# Patient Record
Sex: Male | Born: 1945
Health system: Southern US, Community
[De-identification: ages and names within clinical notes are randomized; demographics above are authoritative.]

## PROBLEM LIST (undated history)

## (undated) DIAGNOSIS — E1169 Type 2 diabetes mellitus with other specified complication: Secondary | ICD-10-CM

## (undated) DIAGNOSIS — J189 Pneumonia, unspecified organism: Secondary | ICD-10-CM

## (undated) DIAGNOSIS — K219 Gastro-esophageal reflux disease without esophagitis: Secondary | ICD-10-CM

## (undated) DIAGNOSIS — Z973 Presence of spectacles and contact lenses: Secondary | ICD-10-CM

## (undated) DIAGNOSIS — C3490 Malignant neoplasm of unspecified part of unspecified bronchus or lung: Secondary | ICD-10-CM

## (undated) DIAGNOSIS — E785 Hyperlipidemia, unspecified: Secondary | ICD-10-CM

## (undated) DIAGNOSIS — Z972 Presence of dental prosthetic device (complete) (partial): Secondary | ICD-10-CM

## (undated) DIAGNOSIS — E669 Obesity, unspecified: Secondary | ICD-10-CM

## (undated) DIAGNOSIS — C7931 Secondary malignant neoplasm of brain: Secondary | ICD-10-CM

## (undated) DIAGNOSIS — K08109 Complete loss of teeth, unspecified cause, unspecified class: Secondary | ICD-10-CM

## (undated) DIAGNOSIS — M1990 Unspecified osteoarthritis, unspecified site: Secondary | ICD-10-CM

## (undated) HISTORY — PX: MULTIPLE TOOTH EXTRACTIONS: SHX2053

## (undated) HISTORY — PX: COLONOSCOPY W/ BIOPSIES AND POLYPECTOMY: SHX1376

## (undated) HISTORY — PX: FRACTURE SURGERY: SHX138

---

## 2017-01-19 DIAGNOSIS — E663 Overweight: Secondary | ICD-10-CM | POA: Diagnosis not present

## 2017-01-19 DIAGNOSIS — Z1212 Encounter for screening for malignant neoplasm of rectum: Secondary | ICD-10-CM | POA: Diagnosis not present

## 2017-01-19 DIAGNOSIS — I1310 Hypertensive heart and chronic kidney disease without heart failure, with stage 1 through stage 4 chronic kidney disease, or unspecified chronic kidney disease: Secondary | ICD-10-CM | POA: Diagnosis not present

## 2017-01-19 DIAGNOSIS — N182 Chronic kidney disease, stage 2 (mild): Secondary | ICD-10-CM | POA: Diagnosis not present

## 2017-01-19 DIAGNOSIS — Z125 Encounter for screening for malignant neoplasm of prostate: Secondary | ICD-10-CM | POA: Diagnosis not present

## 2017-01-19 DIAGNOSIS — Z1211 Encounter for screening for malignant neoplasm of colon: Secondary | ICD-10-CM | POA: Diagnosis not present

## 2017-01-19 DIAGNOSIS — Z0000 Encounter for general adult medical examination without abnormal findings: Secondary | ICD-10-CM | POA: Diagnosis not present

## 2017-01-19 DIAGNOSIS — Z1389 Encounter for screening for other disorder: Secondary | ICD-10-CM | POA: Diagnosis not present

## 2017-01-19 DIAGNOSIS — Z79899 Other long term (current) drug therapy: Secondary | ICD-10-CM | POA: Diagnosis not present

## 2017-01-19 DIAGNOSIS — R7303 Prediabetes: Secondary | ICD-10-CM | POA: Diagnosis not present

## 2017-01-19 DIAGNOSIS — E785 Hyperlipidemia, unspecified: Secondary | ICD-10-CM | POA: Diagnosis not present

## 2017-02-16 DIAGNOSIS — M79671 Pain in right foot: Secondary | ICD-10-CM | POA: Diagnosis not present

## 2017-02-21 DIAGNOSIS — J0100 Acute maxillary sinusitis, unspecified: Secondary | ICD-10-CM | POA: Diagnosis not present

## 2017-04-15 DIAGNOSIS — J45901 Unspecified asthma with (acute) exacerbation: Secondary | ICD-10-CM | POA: Diagnosis not present

## 2017-04-15 DIAGNOSIS — J22 Unspecified acute lower respiratory infection: Secondary | ICD-10-CM | POA: Diagnosis not present

## 2017-04-15 DIAGNOSIS — J101 Influenza due to other identified influenza virus with other respiratory manifestations: Secondary | ICD-10-CM | POA: Diagnosis not present

## 2017-04-15 DIAGNOSIS — J302 Other seasonal allergic rhinitis: Secondary | ICD-10-CM | POA: Diagnosis not present

## 2017-04-29 DIAGNOSIS — J22 Unspecified acute lower respiratory infection: Secondary | ICD-10-CM | POA: Diagnosis not present

## 2017-07-19 DIAGNOSIS — E785 Hyperlipidemia, unspecified: Secondary | ICD-10-CM | POA: Diagnosis not present

## 2017-07-19 DIAGNOSIS — Z79899 Other long term (current) drug therapy: Secondary | ICD-10-CM | POA: Diagnosis not present

## 2017-07-19 DIAGNOSIS — R7303 Prediabetes: Secondary | ICD-10-CM | POA: Diagnosis not present

## 2017-07-19 DIAGNOSIS — N182 Chronic kidney disease, stage 2 (mild): Secondary | ICD-10-CM | POA: Diagnosis not present

## 2017-07-19 DIAGNOSIS — I1310 Hypertensive heart and chronic kidney disease without heart failure, with stage 1 through stage 4 chronic kidney disease, or unspecified chronic kidney disease: Secondary | ICD-10-CM | POA: Diagnosis not present

## 2017-08-25 DIAGNOSIS — M79675 Pain in left toe(s): Secondary | ICD-10-CM | POA: Diagnosis not present

## 2017-10-12 DIAGNOSIS — J301 Allergic rhinitis due to pollen: Secondary | ICD-10-CM | POA: Diagnosis not present

## 2017-11-16 DIAGNOSIS — Z125 Encounter for screening for malignant neoplasm of prostate: Secondary | ICD-10-CM | POA: Diagnosis not present

## 2017-11-16 DIAGNOSIS — I10 Essential (primary) hypertension: Secondary | ICD-10-CM | POA: Diagnosis not present

## 2017-11-16 DIAGNOSIS — R7303 Prediabetes: Secondary | ICD-10-CM | POA: Diagnosis not present

## 2017-11-16 DIAGNOSIS — Z0000 Encounter for general adult medical examination without abnormal findings: Secondary | ICD-10-CM | POA: Diagnosis not present

## 2017-11-16 DIAGNOSIS — E785 Hyperlipidemia, unspecified: Secondary | ICD-10-CM | POA: Diagnosis not present

## 2017-11-16 DIAGNOSIS — Z136 Encounter for screening for cardiovascular disorders: Secondary | ICD-10-CM | POA: Diagnosis not present

## 2017-11-16 DIAGNOSIS — Z1389 Encounter for screening for other disorder: Secondary | ICD-10-CM | POA: Diagnosis not present

## 2018-01-07 DIAGNOSIS — M25512 Pain in left shoulder: Secondary | ICD-10-CM | POA: Diagnosis not present

## 2018-01-12 DIAGNOSIS — M75122 Complete rotator cuff tear or rupture of left shoulder, not specified as traumatic: Secondary | ICD-10-CM | POA: Diagnosis not present

## 2018-01-14 DIAGNOSIS — S46812A Strain of other muscles, fascia and tendons at shoulder and upper arm level, left arm, initial encounter: Secondary | ICD-10-CM | POA: Diagnosis not present

## 2018-01-14 DIAGNOSIS — M75112 Incomplete rotator cuff tear or rupture of left shoulder, not specified as traumatic: Secondary | ICD-10-CM | POA: Diagnosis not present

## 2018-01-14 DIAGNOSIS — M7552 Bursitis of left shoulder: Secondary | ICD-10-CM | POA: Diagnosis not present

## 2018-01-14 DIAGNOSIS — M899 Disorder of bone, unspecified: Secondary | ICD-10-CM | POA: Diagnosis not present

## 2018-01-14 DIAGNOSIS — X58XXXA Exposure to other specified factors, initial encounter: Secondary | ICD-10-CM | POA: Diagnosis not present

## 2018-01-18 DIAGNOSIS — M75120 Complete rotator cuff tear or rupture of unspecified shoulder, not specified as traumatic: Secondary | ICD-10-CM | POA: Diagnosis not present

## 2018-01-24 DIAGNOSIS — C4002 Malignant neoplasm of scapula and long bones of left upper limb: Secondary | ICD-10-CM | POA: Diagnosis not present

## 2018-01-24 DIAGNOSIS — Z87891 Personal history of nicotine dependence: Secondary | ICD-10-CM | POA: Diagnosis not present

## 2018-01-24 DIAGNOSIS — M85812 Other specified disorders of bone density and structure, left shoulder: Secondary | ICD-10-CM | POA: Diagnosis not present

## 2018-01-24 DIAGNOSIS — K589 Irritable bowel syndrome without diarrhea: Secondary | ICD-10-CM | POA: Diagnosis not present

## 2018-01-24 DIAGNOSIS — M898X1 Other specified disorders of bone, shoulder: Secondary | ICD-10-CM | POA: Diagnosis not present

## 2018-01-24 DIAGNOSIS — M75120 Complete rotator cuff tear or rupture of unspecified shoulder, not specified as traumatic: Secondary | ICD-10-CM | POA: Diagnosis not present

## 2018-01-24 DIAGNOSIS — Z01810 Encounter for preprocedural cardiovascular examination: Secondary | ICD-10-CM | POA: Diagnosis not present

## 2018-01-24 DIAGNOSIS — K219 Gastro-esophageal reflux disease without esophagitis: Secondary | ICD-10-CM | POA: Diagnosis not present

## 2018-01-24 DIAGNOSIS — Z01818 Encounter for other preprocedural examination: Secondary | ICD-10-CM | POA: Diagnosis not present

## 2018-01-24 DIAGNOSIS — Z79899 Other long term (current) drug therapy: Secondary | ICD-10-CM | POA: Diagnosis not present

## 2018-01-24 DIAGNOSIS — I10 Essential (primary) hypertension: Secondary | ICD-10-CM | POA: Diagnosis not present

## 2018-02-01 DIAGNOSIS — F17211 Nicotine dependence, cigarettes, in remission: Secondary | ICD-10-CM | POA: Diagnosis not present

## 2018-02-01 DIAGNOSIS — C801 Malignant (primary) neoplasm, unspecified: Secondary | ICD-10-CM | POA: Diagnosis not present

## 2018-02-01 DIAGNOSIS — C7951 Secondary malignant neoplasm of bone: Secondary | ICD-10-CM | POA: Diagnosis not present

## 2018-02-03 DIAGNOSIS — R937 Abnormal findings on diagnostic imaging of other parts of musculoskeletal system: Secondary | ICD-10-CM | POA: Diagnosis not present

## 2018-02-03 DIAGNOSIS — C801 Malignant (primary) neoplasm, unspecified: Secondary | ICD-10-CM | POA: Diagnosis not present

## 2018-02-03 DIAGNOSIS — M85812 Other specified disorders of bone density and structure, left shoulder: Secondary | ICD-10-CM | POA: Diagnosis not present

## 2018-02-03 DIAGNOSIS — C7951 Secondary malignant neoplasm of bone: Secondary | ICD-10-CM | POA: Diagnosis not present

## 2018-02-03 DIAGNOSIS — R911 Solitary pulmonary nodule: Secondary | ICD-10-CM | POA: Diagnosis not present

## 2018-02-03 DIAGNOSIS — K5790 Diverticulosis of intestine, part unspecified, without perforation or abscess without bleeding: Secondary | ICD-10-CM | POA: Diagnosis not present

## 2018-02-04 DIAGNOSIS — Z85828 Personal history of other malignant neoplasm of skin: Secondary | ICD-10-CM | POA: Diagnosis not present

## 2018-02-04 DIAGNOSIS — C7951 Secondary malignant neoplasm of bone: Secondary | ICD-10-CM | POA: Diagnosis not present

## 2018-02-04 DIAGNOSIS — C3412 Malignant neoplasm of upper lobe, left bronchus or lung: Secondary | ICD-10-CM | POA: Diagnosis not present

## 2018-02-04 DIAGNOSIS — C771 Secondary and unspecified malignant neoplasm of intrathoracic lymph nodes: Secondary | ICD-10-CM | POA: Diagnosis not present

## 2018-02-10 DIAGNOSIS — C7951 Secondary malignant neoplasm of bone: Secondary | ICD-10-CM | POA: Diagnosis not present

## 2018-02-14 DIAGNOSIS — E669 Obesity, unspecified: Secondary | ICD-10-CM | POA: Diagnosis not present

## 2018-02-14 DIAGNOSIS — I10 Essential (primary) hypertension: Secondary | ICD-10-CM | POA: Diagnosis not present

## 2018-02-14 DIAGNOSIS — E785 Hyperlipidemia, unspecified: Secondary | ICD-10-CM | POA: Diagnosis not present

## 2018-02-14 DIAGNOSIS — C7951 Secondary malignant neoplasm of bone: Secondary | ICD-10-CM | POA: Diagnosis not present

## 2018-02-14 DIAGNOSIS — K589 Irritable bowel syndrome without diarrhea: Secondary | ICD-10-CM | POA: Diagnosis not present

## 2018-02-14 DIAGNOSIS — Z452 Encounter for adjustment and management of vascular access device: Secondary | ICD-10-CM | POA: Diagnosis not present

## 2018-02-14 DIAGNOSIS — M1710 Unilateral primary osteoarthritis, unspecified knee: Secondary | ICD-10-CM | POA: Diagnosis not present

## 2018-02-14 DIAGNOSIS — Z79899 Other long term (current) drug therapy: Secondary | ICD-10-CM | POA: Diagnosis not present

## 2018-02-14 DIAGNOSIS — Z859 Personal history of malignant neoplasm, unspecified: Secondary | ICD-10-CM | POA: Diagnosis not present

## 2018-02-14 DIAGNOSIS — Z87891 Personal history of nicotine dependence: Secondary | ICD-10-CM | POA: Diagnosis not present

## 2018-02-15 DIAGNOSIS — C3412 Malignant neoplasm of upper lobe, left bronchus or lung: Secondary | ICD-10-CM | POA: Diagnosis not present

## 2018-02-15 DIAGNOSIS — C7951 Secondary malignant neoplasm of bone: Secondary | ICD-10-CM | POA: Diagnosis not present

## 2018-02-16 DIAGNOSIS — Z1211 Encounter for screening for malignant neoplasm of colon: Secondary | ICD-10-CM | POA: Diagnosis not present

## 2018-02-16 DIAGNOSIS — I10 Essential (primary) hypertension: Secondary | ICD-10-CM | POA: Diagnosis not present

## 2018-02-16 DIAGNOSIS — Z0000 Encounter for general adult medical examination without abnormal findings: Secondary | ICD-10-CM | POA: Diagnosis not present

## 2018-02-16 DIAGNOSIS — R7303 Prediabetes: Secondary | ICD-10-CM | POA: Diagnosis not present

## 2018-02-16 DIAGNOSIS — E669 Obesity, unspecified: Secondary | ICD-10-CM | POA: Diagnosis not present

## 2018-02-16 DIAGNOSIS — Z125 Encounter for screening for malignant neoplasm of prostate: Secondary | ICD-10-CM | POA: Diagnosis not present

## 2018-02-16 DIAGNOSIS — Z9181 History of falling: Secondary | ICD-10-CM | POA: Diagnosis not present

## 2018-02-16 DIAGNOSIS — Z1331 Encounter for screening for depression: Secondary | ICD-10-CM | POA: Diagnosis not present

## 2018-02-16 DIAGNOSIS — E785 Hyperlipidemia, unspecified: Secondary | ICD-10-CM | POA: Diagnosis not present

## 2018-02-16 DIAGNOSIS — Z6830 Body mass index (BMI) 30.0-30.9, adult: Secondary | ICD-10-CM | POA: Diagnosis not present

## 2018-02-17 DIAGNOSIS — Z5111 Encounter for antineoplastic chemotherapy: Secondary | ICD-10-CM | POA: Diagnosis not present

## 2018-02-17 DIAGNOSIS — Z79899 Other long term (current) drug therapy: Secondary | ICD-10-CM | POA: Diagnosis not present

## 2018-02-17 DIAGNOSIS — C7951 Secondary malignant neoplasm of bone: Secondary | ICD-10-CM | POA: Diagnosis not present

## 2018-02-17 DIAGNOSIS — C3412 Malignant neoplasm of upper lobe, left bronchus or lung: Secondary | ICD-10-CM | POA: Diagnosis not present

## 2018-02-23 DIAGNOSIS — K5900 Constipation, unspecified: Secondary | ICD-10-CM | POA: Diagnosis not present

## 2018-02-23 DIAGNOSIS — Z515 Encounter for palliative care: Secondary | ICD-10-CM | POA: Diagnosis not present

## 2018-02-23 DIAGNOSIS — M1990 Unspecified osteoarthritis, unspecified site: Secondary | ICD-10-CM | POA: Diagnosis not present

## 2018-02-23 DIAGNOSIS — C3412 Malignant neoplasm of upper lobe, left bronchus or lung: Secondary | ICD-10-CM | POA: Diagnosis not present

## 2018-02-23 DIAGNOSIS — C7951 Secondary malignant neoplasm of bone: Secondary | ICD-10-CM | POA: Diagnosis not present

## 2018-03-03 DIAGNOSIS — C7951 Secondary malignant neoplasm of bone: Secondary | ICD-10-CM | POA: Diagnosis not present

## 2018-03-03 DIAGNOSIS — C3412 Malignant neoplasm of upper lobe, left bronchus or lung: Secondary | ICD-10-CM | POA: Diagnosis not present

## 2018-03-03 DIAGNOSIS — Z5111 Encounter for antineoplastic chemotherapy: Secondary | ICD-10-CM | POA: Diagnosis not present

## 2018-03-10 DIAGNOSIS — C7951 Secondary malignant neoplasm of bone: Secondary | ICD-10-CM | POA: Diagnosis not present

## 2018-03-10 DIAGNOSIS — Z79899 Other long term (current) drug therapy: Secondary | ICD-10-CM | POA: Diagnosis not present

## 2018-03-10 DIAGNOSIS — C3412 Malignant neoplasm of upper lobe, left bronchus or lung: Secondary | ICD-10-CM | POA: Diagnosis not present

## 2018-03-10 DIAGNOSIS — C771 Secondary and unspecified malignant neoplasm of intrathoracic lymph nodes: Secondary | ICD-10-CM | POA: Diagnosis not present

## 2018-03-17 DIAGNOSIS — C3412 Malignant neoplasm of upper lobe, left bronchus or lung: Secondary | ICD-10-CM | POA: Diagnosis not present

## 2018-03-17 DIAGNOSIS — C7951 Secondary malignant neoplasm of bone: Secondary | ICD-10-CM | POA: Diagnosis not present

## 2018-03-17 DIAGNOSIS — Z5111 Encounter for antineoplastic chemotherapy: Secondary | ICD-10-CM | POA: Diagnosis not present

## 2018-03-24 DIAGNOSIS — C7951 Secondary malignant neoplasm of bone: Secondary | ICD-10-CM | POA: Diagnosis not present

## 2018-03-24 DIAGNOSIS — Z5111 Encounter for antineoplastic chemotherapy: Secondary | ICD-10-CM | POA: Diagnosis not present

## 2018-03-24 DIAGNOSIS — C3412 Malignant neoplasm of upper lobe, left bronchus or lung: Secondary | ICD-10-CM | POA: Diagnosis not present

## 2018-03-31 DIAGNOSIS — C3412 Malignant neoplasm of upper lobe, left bronchus or lung: Secondary | ICD-10-CM | POA: Diagnosis not present

## 2018-03-31 DIAGNOSIS — M1712 Unilateral primary osteoarthritis, left knee: Secondary | ICD-10-CM | POA: Diagnosis not present

## 2018-03-31 DIAGNOSIS — C771 Secondary and unspecified malignant neoplasm of intrathoracic lymph nodes: Secondary | ICD-10-CM | POA: Diagnosis not present

## 2018-03-31 DIAGNOSIS — Z515 Encounter for palliative care: Secondary | ICD-10-CM | POA: Diagnosis not present

## 2018-03-31 DIAGNOSIS — C7951 Secondary malignant neoplasm of bone: Secondary | ICD-10-CM | POA: Diagnosis not present

## 2018-03-31 DIAGNOSIS — M25519 Pain in unspecified shoulder: Secondary | ICD-10-CM | POA: Diagnosis not present

## 2018-04-07 DIAGNOSIS — Z5111 Encounter for antineoplastic chemotherapy: Secondary | ICD-10-CM | POA: Diagnosis not present

## 2018-04-07 DIAGNOSIS — C3412 Malignant neoplasm of upper lobe, left bronchus or lung: Secondary | ICD-10-CM | POA: Diagnosis not present

## 2018-04-07 DIAGNOSIS — C7951 Secondary malignant neoplasm of bone: Secondary | ICD-10-CM | POA: Diagnosis not present

## 2018-04-14 DIAGNOSIS — C3412 Malignant neoplasm of upper lobe, left bronchus or lung: Secondary | ICD-10-CM | POA: Diagnosis not present

## 2018-04-14 DIAGNOSIS — Z5111 Encounter for antineoplastic chemotherapy: Secondary | ICD-10-CM | POA: Diagnosis not present

## 2018-04-14 DIAGNOSIS — C7951 Secondary malignant neoplasm of bone: Secondary | ICD-10-CM | POA: Diagnosis not present

## 2018-04-21 DIAGNOSIS — Z79899 Other long term (current) drug therapy: Secondary | ICD-10-CM | POA: Diagnosis not present

## 2018-04-21 DIAGNOSIS — R530 Neoplastic (malignant) related fatigue: Secondary | ICD-10-CM | POA: Diagnosis not present

## 2018-04-21 DIAGNOSIS — C3412 Malignant neoplasm of upper lobe, left bronchus or lung: Secondary | ICD-10-CM | POA: Diagnosis not present

## 2018-04-21 DIAGNOSIS — C7951 Secondary malignant neoplasm of bone: Secondary | ICD-10-CM | POA: Diagnosis not present

## 2018-04-21 DIAGNOSIS — C771 Secondary and unspecified malignant neoplasm of intrathoracic lymph nodes: Secondary | ICD-10-CM | POA: Diagnosis not present

## 2018-05-12 DIAGNOSIS — C7951 Secondary malignant neoplasm of bone: Secondary | ICD-10-CM

## 2018-05-12 DIAGNOSIS — I2699 Other pulmonary embolism without acute cor pulmonale: Secondary | ICD-10-CM

## 2018-05-12 DIAGNOSIS — C771 Secondary and unspecified malignant neoplasm of intrathoracic lymph nodes: Secondary | ICD-10-CM

## 2018-05-12 DIAGNOSIS — Z7901 Long term (current) use of anticoagulants: Secondary | ICD-10-CM

## 2018-05-12 DIAGNOSIS — C3412 Malignant neoplasm of upper lobe, left bronchus or lung: Secondary | ICD-10-CM

## 2018-06-02 DIAGNOSIS — Z7901 Long term (current) use of anticoagulants: Secondary | ICD-10-CM

## 2018-06-02 DIAGNOSIS — C771 Secondary and unspecified malignant neoplasm of intrathoracic lymph nodes: Secondary | ICD-10-CM

## 2018-06-02 DIAGNOSIS — C3412 Malignant neoplasm of upper lobe, left bronchus or lung: Secondary | ICD-10-CM

## 2018-06-02 DIAGNOSIS — C7951 Secondary malignant neoplasm of bone: Secondary | ICD-10-CM

## 2018-06-02 DIAGNOSIS — I2699 Other pulmonary embolism without acute cor pulmonale: Secondary | ICD-10-CM

## 2018-06-23 DIAGNOSIS — C3412 Malignant neoplasm of upper lobe, left bronchus or lung: Secondary | ICD-10-CM

## 2018-06-23 DIAGNOSIS — I2699 Other pulmonary embolism without acute cor pulmonale: Secondary | ICD-10-CM | POA: Diagnosis not present

## 2018-06-23 DIAGNOSIS — Z7901 Long term (current) use of anticoagulants: Secondary | ICD-10-CM

## 2018-06-23 DIAGNOSIS — C7951 Secondary malignant neoplasm of bone: Secondary | ICD-10-CM | POA: Diagnosis not present

## 2018-06-23 DIAGNOSIS — C771 Secondary and unspecified malignant neoplasm of intrathoracic lymph nodes: Secondary | ICD-10-CM

## 2018-06-23 DIAGNOSIS — L989 Disorder of the skin and subcutaneous tissue, unspecified: Secondary | ICD-10-CM

## 2018-07-14 DIAGNOSIS — Z7901 Long term (current) use of anticoagulants: Secondary | ICD-10-CM

## 2018-07-14 DIAGNOSIS — C7951 Secondary malignant neoplasm of bone: Secondary | ICD-10-CM

## 2018-07-14 DIAGNOSIS — I2699 Other pulmonary embolism without acute cor pulmonale: Secondary | ICD-10-CM

## 2018-07-14 DIAGNOSIS — C3412 Malignant neoplasm of upper lobe, left bronchus or lung: Secondary | ICD-10-CM

## 2018-07-14 DIAGNOSIS — C771 Secondary and unspecified malignant neoplasm of intrathoracic lymph nodes: Secondary | ICD-10-CM

## 2018-08-04 DIAGNOSIS — C7951 Secondary malignant neoplasm of bone: Secondary | ICD-10-CM

## 2018-08-04 DIAGNOSIS — Z7901 Long term (current) use of anticoagulants: Secondary | ICD-10-CM

## 2018-08-04 DIAGNOSIS — C771 Secondary and unspecified malignant neoplasm of intrathoracic lymph nodes: Secondary | ICD-10-CM | POA: Diagnosis not present

## 2018-08-04 DIAGNOSIS — C3412 Malignant neoplasm of upper lobe, left bronchus or lung: Secondary | ICD-10-CM | POA: Diagnosis not present

## 2018-08-04 DIAGNOSIS — Z86711 Personal history of pulmonary embolism: Secondary | ICD-10-CM | POA: Diagnosis not present

## 2018-08-24 DIAGNOSIS — I2699 Other pulmonary embolism without acute cor pulmonale: Secondary | ICD-10-CM | POA: Diagnosis not present

## 2018-08-24 DIAGNOSIS — R7303 Prediabetes: Secondary | ICD-10-CM | POA: Diagnosis not present

## 2018-08-24 DIAGNOSIS — I1310 Hypertensive heart and chronic kidney disease without heart failure, with stage 1 through stage 4 chronic kidney disease, or unspecified chronic kidney disease: Secondary | ICD-10-CM | POA: Diagnosis not present

## 2018-08-24 DIAGNOSIS — Z1339 Encounter for screening examination for other mental health and behavioral disorders: Secondary | ICD-10-CM | POA: Diagnosis not present

## 2018-08-24 DIAGNOSIS — M79672 Pain in left foot: Secondary | ICD-10-CM | POA: Diagnosis not present

## 2018-08-24 DIAGNOSIS — C3490 Malignant neoplasm of unspecified part of unspecified bronchus or lung: Secondary | ICD-10-CM | POA: Diagnosis not present

## 2018-08-24 DIAGNOSIS — E785 Hyperlipidemia, unspecified: Secondary | ICD-10-CM | POA: Diagnosis not present

## 2018-08-24 DIAGNOSIS — K219 Gastro-esophageal reflux disease without esophagitis: Secondary | ICD-10-CM | POA: Diagnosis not present

## 2018-09-15 DIAGNOSIS — C7951 Secondary malignant neoplasm of bone: Secondary | ICD-10-CM

## 2018-09-15 DIAGNOSIS — R509 Fever, unspecified: Secondary | ICD-10-CM

## 2018-09-15 DIAGNOSIS — B001 Herpesviral vesicular dermatitis: Secondary | ICD-10-CM

## 2018-09-15 DIAGNOSIS — C3412 Malignant neoplasm of upper lobe, left bronchus or lung: Secondary | ICD-10-CM

## 2018-10-06 DIAGNOSIS — C3412 Malignant neoplasm of upper lobe, left bronchus or lung: Secondary | ICD-10-CM | POA: Diagnosis not present

## 2018-10-06 DIAGNOSIS — C7951 Secondary malignant neoplasm of bone: Secondary | ICD-10-CM

## 2018-10-27 DIAGNOSIS — C3412 Malignant neoplasm of upper lobe, left bronchus or lung: Secondary | ICD-10-CM | POA: Diagnosis not present

## 2018-10-27 DIAGNOSIS — C7951 Secondary malignant neoplasm of bone: Secondary | ICD-10-CM | POA: Diagnosis not present

## 2018-12-06 DIAGNOSIS — Z6833 Body mass index (BMI) 33.0-33.9, adult: Secondary | ICD-10-CM | POA: Diagnosis not present

## 2018-12-06 DIAGNOSIS — E785 Hyperlipidemia, unspecified: Secondary | ICD-10-CM | POA: Diagnosis not present

## 2018-12-06 DIAGNOSIS — E669 Obesity, unspecified: Secondary | ICD-10-CM | POA: Diagnosis not present

## 2018-12-06 DIAGNOSIS — Z0000 Encounter for general adult medical examination without abnormal findings: Secondary | ICD-10-CM | POA: Diagnosis not present

## 2018-12-06 DIAGNOSIS — I1310 Hypertensive heart and chronic kidney disease without heart failure, with stage 1 through stage 4 chronic kidney disease, or unspecified chronic kidney disease: Secondary | ICD-10-CM | POA: Diagnosis not present

## 2018-12-06 DIAGNOSIS — Z125 Encounter for screening for malignant neoplasm of prostate: Secondary | ICD-10-CM | POA: Diagnosis not present

## 2018-12-06 DIAGNOSIS — Z139 Encounter for screening, unspecified: Secondary | ICD-10-CM | POA: Diagnosis not present

## 2018-12-29 DIAGNOSIS — C7951 Secondary malignant neoplasm of bone: Secondary | ICD-10-CM

## 2018-12-29 DIAGNOSIS — C3412 Malignant neoplasm of upper lobe, left bronchus or lung: Secondary | ICD-10-CM | POA: Diagnosis not present

## 2019-01-19 DIAGNOSIS — C3412 Malignant neoplasm of upper lobe, left bronchus or lung: Secondary | ICD-10-CM | POA: Diagnosis not present

## 2019-01-19 DIAGNOSIS — C7951 Secondary malignant neoplasm of bone: Secondary | ICD-10-CM

## 2019-02-09 DIAGNOSIS — C3412 Malignant neoplasm of upper lobe, left bronchus or lung: Secondary | ICD-10-CM | POA: Diagnosis not present

## 2019-03-02 DIAGNOSIS — C3412 Malignant neoplasm of upper lobe, left bronchus or lung: Secondary | ICD-10-CM | POA: Diagnosis not present

## 2019-03-23 DIAGNOSIS — C3412 Malignant neoplasm of upper lobe, left bronchus or lung: Secondary | ICD-10-CM | POA: Diagnosis not present

## 2019-04-13 DIAGNOSIS — C3412 Malignant neoplasm of upper lobe, left bronchus or lung: Secondary | ICD-10-CM | POA: Diagnosis not present

## 2019-05-04 DIAGNOSIS — C3412 Malignant neoplasm of upper lobe, left bronchus or lung: Secondary | ICD-10-CM | POA: Diagnosis not present

## 2019-05-25 DIAGNOSIS — C3412 Malignant neoplasm of upper lobe, left bronchus or lung: Secondary | ICD-10-CM

## 2019-06-01 DIAGNOSIS — C3412 Malignant neoplasm of upper lobe, left bronchus or lung: Secondary | ICD-10-CM

## 2019-07-31 DIAGNOSIS — C3490 Malignant neoplasm of unspecified part of unspecified bronchus or lung: Secondary | ICD-10-CM | POA: Diagnosis not present

## 2019-07-31 DIAGNOSIS — Z5111 Encounter for antineoplastic chemotherapy: Secondary | ICD-10-CM | POA: Diagnosis not present

## 2019-07-31 DIAGNOSIS — R911 Solitary pulmonary nodule: Secondary | ICD-10-CM | POA: Diagnosis not present

## 2019-08-01 DIAGNOSIS — C7951 Secondary malignant neoplasm of bone: Secondary | ICD-10-CM

## 2019-08-01 DIAGNOSIS — C3412 Malignant neoplasm of upper lobe, left bronchus or lung: Secondary | ICD-10-CM | POA: Diagnosis not present

## 2019-10-26 ENCOUNTER — Other Ambulatory Visit: Payer: Self-pay

## 2019-12-05 DIAGNOSIS — C7951 Secondary malignant neoplasm of bone: Secondary | ICD-10-CM | POA: Diagnosis not present

## 2019-12-14 DIAGNOSIS — E785 Hyperlipidemia, unspecified: Secondary | ICD-10-CM | POA: Diagnosis not present

## 2019-12-14 DIAGNOSIS — Z1331 Encounter for screening for depression: Secondary | ICD-10-CM | POA: Diagnosis not present

## 2019-12-14 DIAGNOSIS — Z6834 Body mass index (BMI) 34.0-34.9, adult: Secondary | ICD-10-CM | POA: Diagnosis not present

## 2019-12-14 DIAGNOSIS — Z136 Encounter for screening for cardiovascular disorders: Secondary | ICD-10-CM | POA: Diagnosis not present

## 2019-12-14 DIAGNOSIS — Z139 Encounter for screening, unspecified: Secondary | ICD-10-CM | POA: Diagnosis not present

## 2019-12-14 DIAGNOSIS — Z0000 Encounter for general adult medical examination without abnormal findings: Secondary | ICD-10-CM | POA: Diagnosis not present

## 2019-12-14 DIAGNOSIS — Z125 Encounter for screening for malignant neoplasm of prostate: Secondary | ICD-10-CM | POA: Diagnosis not present

## 2019-12-14 DIAGNOSIS — Z9181 History of falling: Secondary | ICD-10-CM | POA: Diagnosis not present

## 2019-12-14 DIAGNOSIS — R7303 Prediabetes: Secondary | ICD-10-CM | POA: Diagnosis not present

## 2019-12-14 DIAGNOSIS — C3490 Malignant neoplasm of unspecified part of unspecified bronchus or lung: Secondary | ICD-10-CM | POA: Diagnosis not present

## 2019-12-14 DIAGNOSIS — E669 Obesity, unspecified: Secondary | ICD-10-CM | POA: Diagnosis not present

## 2019-12-14 DIAGNOSIS — N182 Chronic kidney disease, stage 2 (mild): Secondary | ICD-10-CM | POA: Diagnosis not present

## 2019-12-14 DIAGNOSIS — I1310 Hypertensive heart and chronic kidney disease without heart failure, with stage 1 through stage 4 chronic kidney disease, or unspecified chronic kidney disease: Secondary | ICD-10-CM | POA: Diagnosis not present

## 2019-12-14 DIAGNOSIS — K219 Gastro-esophageal reflux disease without esophagitis: Secondary | ICD-10-CM | POA: Diagnosis not present

## 2020-03-14 DIAGNOSIS — R7303 Prediabetes: Secondary | ICD-10-CM | POA: Diagnosis not present

## 2020-03-14 DIAGNOSIS — E785 Hyperlipidemia, unspecified: Secondary | ICD-10-CM | POA: Diagnosis not present

## 2020-03-21 DIAGNOSIS — E785 Hyperlipidemia, unspecified: Secondary | ICD-10-CM | POA: Diagnosis not present

## 2020-03-21 DIAGNOSIS — N182 Chronic kidney disease, stage 2 (mild): Secondary | ICD-10-CM | POA: Diagnosis not present

## 2020-03-21 DIAGNOSIS — I1310 Hypertensive heart and chronic kidney disease without heart failure, with stage 1 through stage 4 chronic kidney disease, or unspecified chronic kidney disease: Secondary | ICD-10-CM | POA: Diagnosis not present

## 2020-03-21 DIAGNOSIS — Z6834 Body mass index (BMI) 34.0-34.9, adult: Secondary | ICD-10-CM | POA: Diagnosis not present

## 2020-03-21 DIAGNOSIS — R7303 Prediabetes: Secondary | ICD-10-CM | POA: Diagnosis not present

## 2020-03-21 DIAGNOSIS — C3490 Malignant neoplasm of unspecified part of unspecified bronchus or lung: Secondary | ICD-10-CM | POA: Diagnosis not present

## 2020-03-21 DIAGNOSIS — Z86718 Personal history of other venous thrombosis and embolism: Secondary | ICD-10-CM | POA: Diagnosis not present

## 2020-03-21 DIAGNOSIS — K219 Gastro-esophageal reflux disease without esophagitis: Secondary | ICD-10-CM | POA: Diagnosis not present

## 2020-03-26 DIAGNOSIS — C7951 Secondary malignant neoplasm of bone: Secondary | ICD-10-CM | POA: Diagnosis not present

## 2020-03-26 DIAGNOSIS — C3412 Malignant neoplasm of upper lobe, left bronchus or lung: Secondary | ICD-10-CM | POA: Diagnosis not present

## 2020-04-10 ENCOUNTER — Telehealth: Payer: Self-pay | Admitting: Internal Medicine

## 2020-04-10 ENCOUNTER — Other Ambulatory Visit: Payer: Self-pay | Admitting: Radiation Therapy

## 2020-04-10 DIAGNOSIS — S299XXA Unspecified injury of thorax, initial encounter: Secondary | ICD-10-CM | POA: Diagnosis not present

## 2020-04-10 DIAGNOSIS — R519 Headache, unspecified: Secondary | ICD-10-CM | POA: Diagnosis not present

## 2020-04-10 DIAGNOSIS — C7931 Secondary malignant neoplasm of brain: Secondary | ICD-10-CM | POA: Diagnosis not present

## 2020-04-10 DIAGNOSIS — G9389 Other specified disorders of brain: Secondary | ICD-10-CM | POA: Diagnosis not present

## 2020-04-10 DIAGNOSIS — Z041 Encounter for examination and observation following transport accident: Secondary | ICD-10-CM | POA: Diagnosis not present

## 2020-04-10 DIAGNOSIS — I10 Essential (primary) hypertension: Secondary | ICD-10-CM | POA: Diagnosis not present

## 2020-04-10 DIAGNOSIS — S0990XA Unspecified injury of head, initial encounter: Secondary | ICD-10-CM | POA: Diagnosis not present

## 2020-04-10 DIAGNOSIS — Z87891 Personal history of nicotine dependence: Secondary | ICD-10-CM | POA: Diagnosis not present
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
## 2020-04-11 ENCOUNTER — Encounter (HOSPITAL_COMMUNITY): Payer: Self-pay | Admitting: Internal Medicine

## 2020-04-11 ENCOUNTER — Inpatient Hospital Stay (HOSPITAL_COMMUNITY): Payer: No Typology Code available for payment source

## 2020-04-11 ENCOUNTER — Inpatient Hospital Stay (HOSPITAL_COMMUNITY)
Admission: AD | Admit: 2020-04-11 | Discharge: 2020-04-12 | DRG: 054 | Disposition: A | Discharge status: Home / Self Care | Payer: No Typology Code available for payment source | Source: Other Acute Inpatient Hospital | Attending: Internal Medicine | Admitting: Internal Medicine

## 2020-04-11 ENCOUNTER — Encounter: Payer: Self-pay | Admitting: *Deleted

## 2020-04-11 ENCOUNTER — Ambulatory Visit
Admission: RE | Admit: 2020-04-11 | Discharge: 2020-04-11 | Disposition: A | Discharge status: Home / Self Care | Payer: No Typology Code available for payment source | Source: Ambulatory Visit | Attending: Radiation Oncology | Admitting: Radiation Oncology

## 2020-04-11 ENCOUNTER — Other Ambulatory Visit: Payer: Self-pay | Admitting: Neurosurgery

## 2020-04-11 ENCOUNTER — Other Ambulatory Visit: Payer: Self-pay | Admitting: Radiation Oncology

## 2020-04-11 DIAGNOSIS — C3490 Malignant neoplasm of unspecified part of unspecified bronchus or lung: Secondary | ICD-10-CM | POA: Insufficient documentation

## 2020-04-11 DIAGNOSIS — Z79899 Other long term (current) drug therapy: Secondary | ICD-10-CM | POA: Diagnosis not present

## 2020-04-11 DIAGNOSIS — Z882 Allergy status to sulfonamides status: Secondary | ICD-10-CM | POA: Diagnosis not present

## 2020-04-11 DIAGNOSIS — K219 Gastro-esophageal reflux disease without esophagitis: Secondary | ICD-10-CM

## 2020-04-11 DIAGNOSIS — Z6831 Body mass index (BMI) 31.0-31.9, adult: Secondary | ICD-10-CM | POA: Diagnosis not present

## 2020-04-11 DIAGNOSIS — C3412 Malignant neoplasm of upper lobe, left bronchus or lung: Secondary | ICD-10-CM | POA: Diagnosis present

## 2020-04-11 DIAGNOSIS — Y92410 Unspecified street and highway as the place of occurrence of the external cause: Secondary | ICD-10-CM

## 2020-04-11 DIAGNOSIS — Z9225 Personal history of immunosupression therapy: Secondary | ICD-10-CM | POA: Diagnosis not present

## 2020-04-11 DIAGNOSIS — E785 Hyperlipidemia, unspecified: Secondary | ICD-10-CM | POA: Diagnosis present

## 2020-04-11 DIAGNOSIS — E669 Obesity, unspecified: Secondary | ICD-10-CM | POA: Diagnosis present

## 2020-04-11 DIAGNOSIS — N183 Chronic kidney disease, stage 3 unspecified: Secondary | ICD-10-CM | POA: Diagnosis present

## 2020-04-11 DIAGNOSIS — C7931 Secondary malignant neoplasm of brain: Principal | ICD-10-CM | POA: Diagnosis present

## 2020-04-11 DIAGNOSIS — G936 Cerebral edema: Secondary | ICD-10-CM | POA: Diagnosis present

## 2020-04-11 DIAGNOSIS — E1169 Type 2 diabetes mellitus with other specified complication: Secondary | ICD-10-CM | POA: Diagnosis not present

## 2020-04-11 DIAGNOSIS — Z7984 Long term (current) use of oral hypoglycemic drugs: Secondary | ICD-10-CM

## 2020-04-11 DIAGNOSIS — G939 Disorder of brain, unspecified: Secondary | ICD-10-CM | POA: Diagnosis not present

## 2020-04-11 DIAGNOSIS — E1122 Type 2 diabetes mellitus with diabetic chronic kidney disease: Secondary | ICD-10-CM | POA: Diagnosis present

## 2020-04-11 DIAGNOSIS — Z9221 Personal history of antineoplastic chemotherapy: Secondary | ICD-10-CM | POA: Diagnosis not present

## 2020-04-11 HISTORY — DX: Type 2 diabetes mellitus with other specified complication: E66.9

## 2020-04-11 HISTORY — DX: Hyperlipidemia, unspecified: E78.5

## 2020-04-11 HISTORY — DX: Type 2 diabetes mellitus with other specified complication: E11.69

## 2020-04-11 HISTORY — DX: Malignant neoplasm of unspecified part of unspecified bronchus or lung: C34.90

## 2020-04-11 LAB — HEMOGLOBIN A1C
Hgb A1c MFr Bld: 6.9 % — ABNORMAL HIGH (ref 4.8–5.6)
Mean Plasma Glucose: 151.33 mg/dL

## 2020-04-11 LAB — SURGICAL PCR SCREEN
MRSA, PCR: NEGATIVE
Staphylococcus aureus: NEGATIVE

## 2020-04-11 LAB — GLUCOSE, CAPILLARY: Glucose-Capillary: 171 mg/dL — ABNORMAL HIGH (ref 70–99)

## 2020-04-11 IMAGING — MR MR HEAD WO/W CM
13 of 14 series · 38 of 48 positions shown · IV contrast (gadavist)
Comparison: [DATE]

CLINICAL DATA: Lung cancer with brain metastasis

EXAM:
MRI HEAD WITHOUT AND WITH CONTRAST
TECHNIQUE: Multiplanar, multiecho pulse sequences of the brain and surrounding
structures were obtained without and with intravenous contrast.
CONTRAST:  10mL GADAVIST GADOBUTROL 1 MMOL/ML IV SOLN

[Series 2: FLAIR · sagittal · 3.0mm · 0.47mm/px · 1 of 45 slices shown (1 of 2)]
[im 1/45]
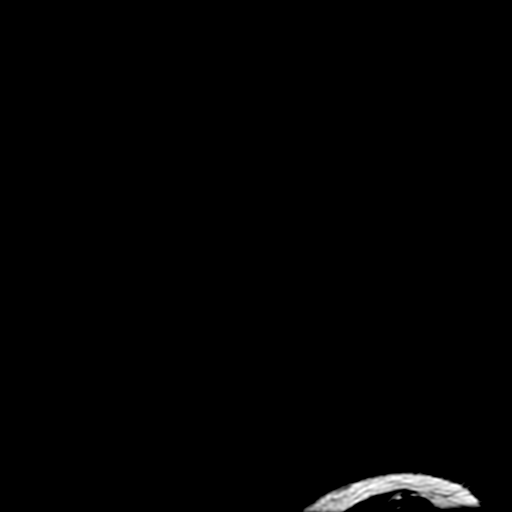

[Series 3: T2 · axial · 5.0mm · 0.43mm/px · 1 of 33 slices shown]
[im 1/33]
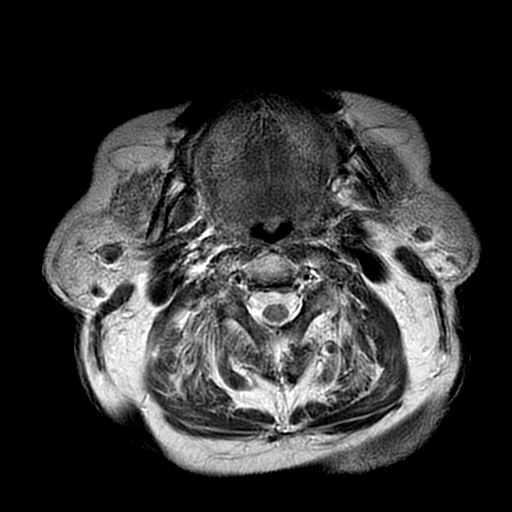

[Series 4: ax dti · axial · 3.0mm · 0.94mm/px · z∈[-79,+113]mm · 15 of 1690 slices shown]
[im 1/1690]
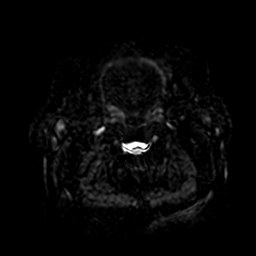
[im 121/1690]
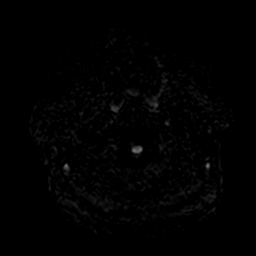
[im 242/1690]
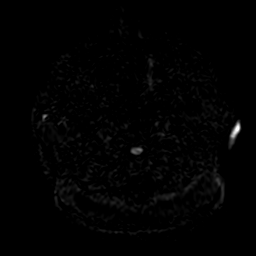
[im 362/1690]
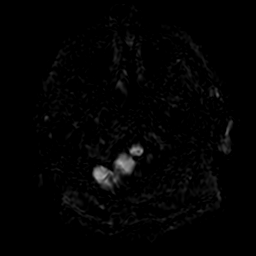
[im 483/1690]
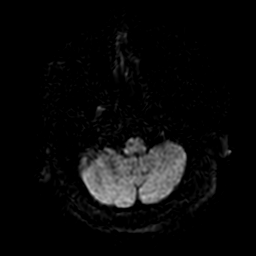
[im 604/1690]
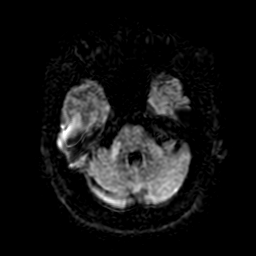
[im 724/1690]
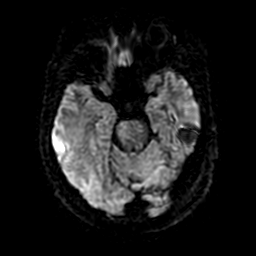
[im 845/1690]
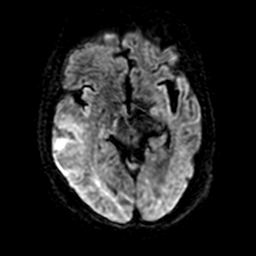
[im 966/1690]
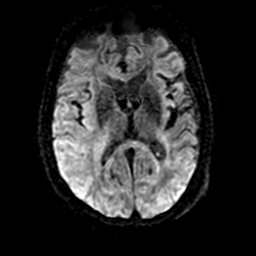
[im 1086/1690]
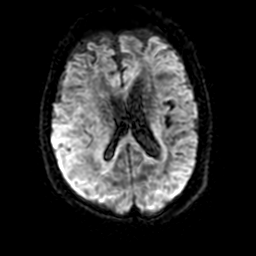
[im 1207/1690]
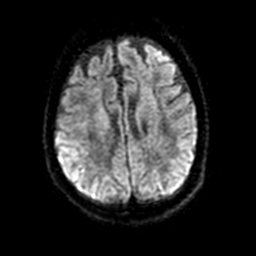
[im 1328/1690]
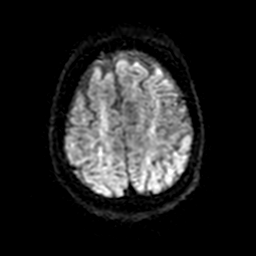
[im 1448/1690]
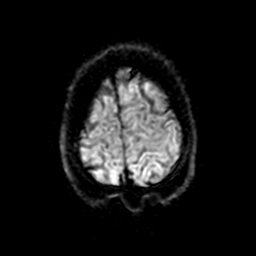
[im 1569/1690]
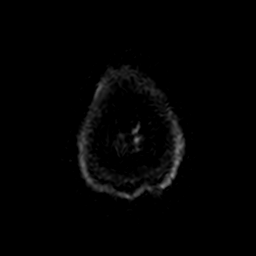
[im 1690/1690]
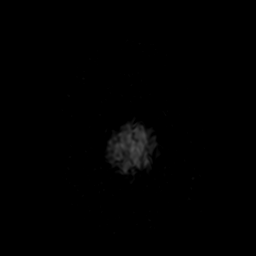

[Series 6: (person_name) · axial · 3.0mm · 0.47mm/px · 1 of 116 slices shown]
[im 1/116]
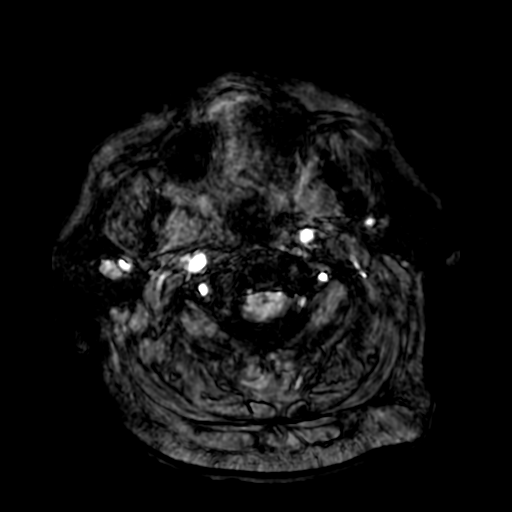

[Series 7: ax 3(person_name) · axial · 1.0mm · 1.02mm/px · 1 of 186 slices shown (1 of 2)]
[im 1/186]
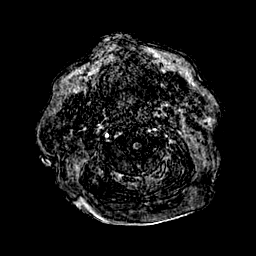

[Series 8: T2 post-contrast · coronal · 3.0mm · 0.39mm/px · 1 of 66 slices shown]
[im 1/66]
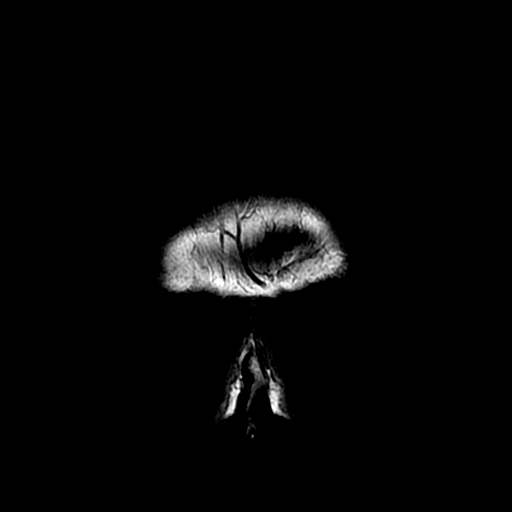

[Series 9: ax 3(person_name) · axial · 1.0mm · 1.02mm/px · z∈[-72,+113]mm · 2 of 186 slices shown (2 of 2)]
[im 1/186]
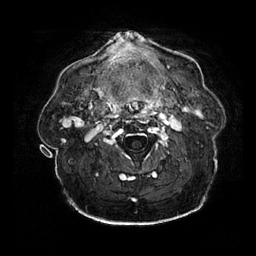
[im 186/186]
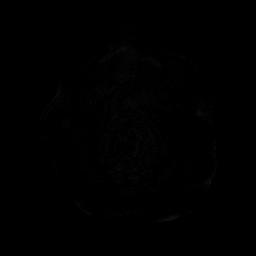

[Series 10: T1 · coronal · 5.0mm · 0.43mm/px · 1 of 33 slices shown]
[im 1/33]
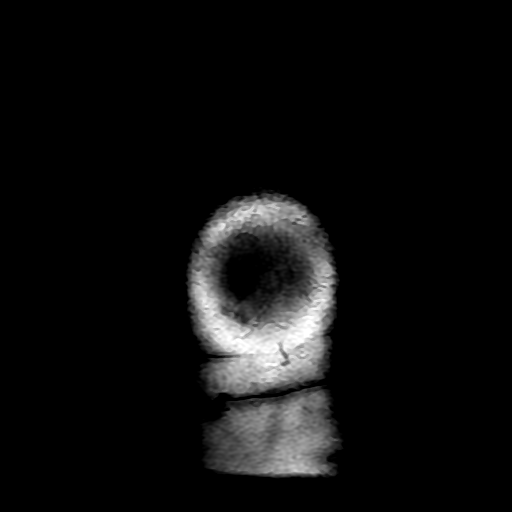

[Series 11: FLAIR · sagittal · 3.0mm · 0.47mm/px · 1 of 45 slices shown (2 of 2)]
[im 1/45]
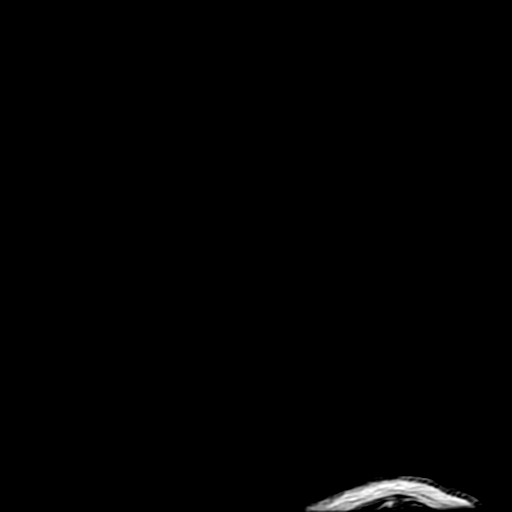

[Series 410: orig: ax dti · axial · 3.0mm · 0.94mm/px · z∈[-79,+113]mm · 9 of 1690 slices shown]
[im 1/1690]
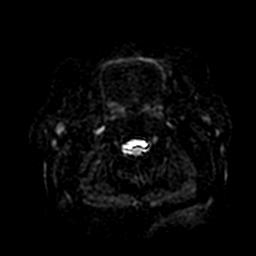
[im 113/1690]
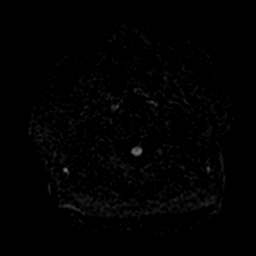
[im 338/1690]
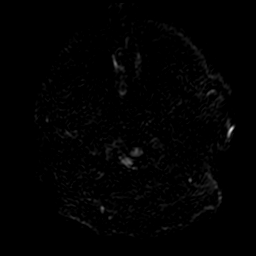
[im 564/1690]
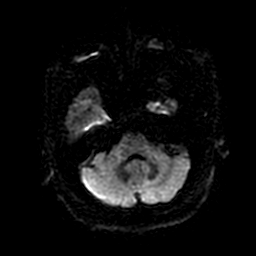
[im 789/1690]
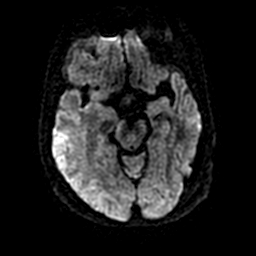
[im 1014/1690]
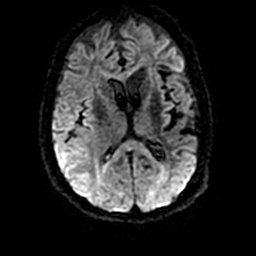
[im 1239/1690]
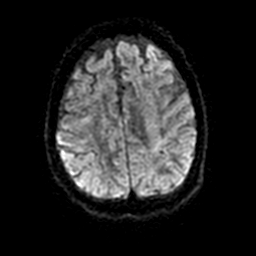
[im 1464/1690]
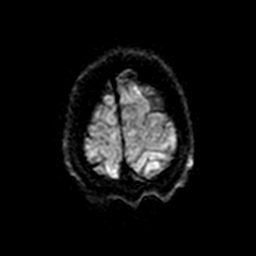
[im 1690/1690]
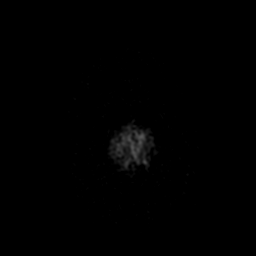

[Series 500: multiplanar reconstruction (mpr) · axial · 1.0mm · 0.50mm/px · z∈[-137,+119]mm · 2 of 257 slices shown (1 of 2)]
[im 1/257]
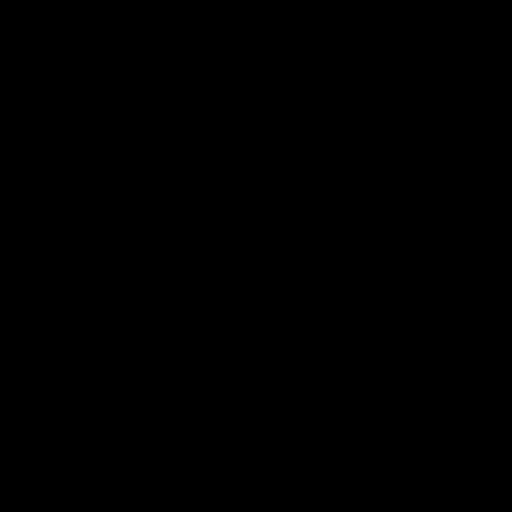
[im 257/257]
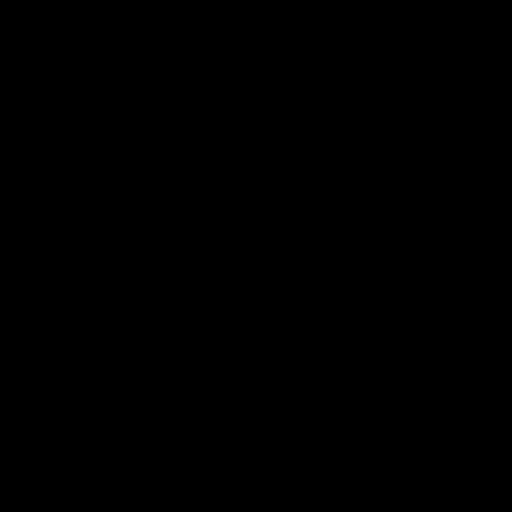

[Series 501: multiplanar reconstruction (mpr) · coronal · 1.0mm · 0.50mm/px · 2 of 245 slices shown (2 of 2)]
[im 1/245]
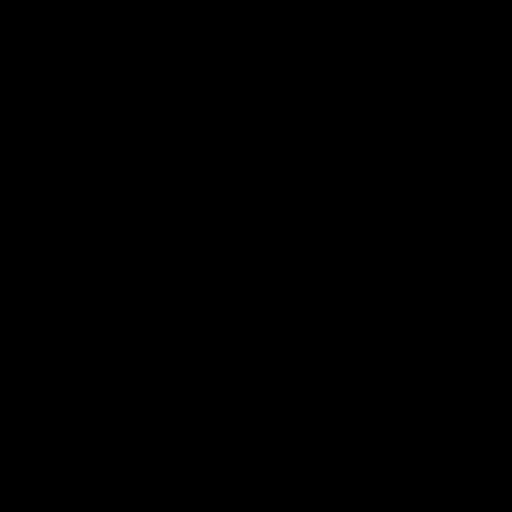
[im 245/245]
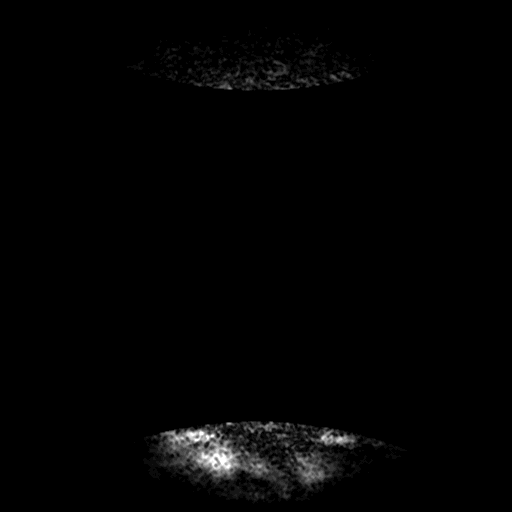

[Series 600: filt_pha: (person_name) · axial · 3.0mm · 0.47mm/px · 1 of 116 slices shown]
[im 1/116]
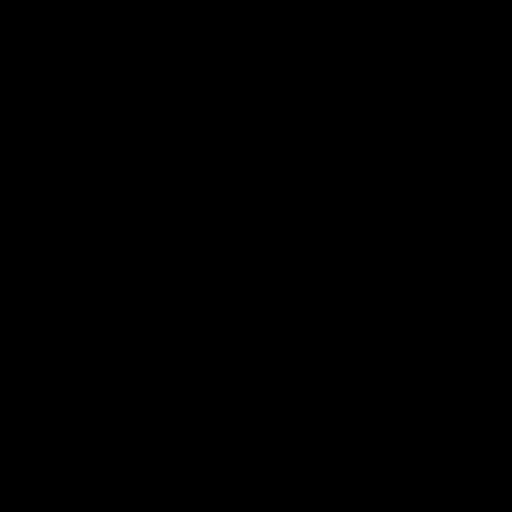

[38 of 48 positions shown; findings below may reference images not displayed]

FINDINGS: Motion artifact is present.

Brain: Heterogeneously enhancing lesion of the peripheral right
temporal lobe probably within the middle temporal gyrus is again
identified. This now measures 1.8 x 1.7 x 2.1 cm with decrease in
size of the more homogeneously enhancing portion at the deep margin.
Extensive surrounding edema is again noted similar to the prior
study. There is unchanged partial effacement of the right lateral
ventricle, slight medialization of the right uncus, and trace
leftward midline shift.

No new or acute findings.

Vascular: Major vessel flow voids at the skull base are preserved.

Skull and upper cervical spine: Normal marrow signal is preserved.

Sinuses/Orbits: Paranasal sinuses are aerated. Orbits are
unremarkable.

Other: Sella is unremarkable.  Mastoid air cells are clear.
IMPRESSION: Right temporal lobe mass measures slightly smaller, which may be
related to interval steroid administration. Extent of surrounding
edema is not substantially changed and there is stable mild regional
mass effect.

## 2020-04-11 MED ORDER — SODIUM CHLORIDE 0.9% FLUSH: 10.0000 mL | INTRAVENOUS | Status: DC | PRN

## 2020-04-11 MED ORDER — GADOBUTROL 1 MMOL/ML IV SOLN
10.0000 mL | Freq: Once | INTRAVENOUS | Status: AC | PRN
  Administered 2020-04-11: 10 mL via INTRAVENOUS

## 2020-04-11 MED ORDER — ACETAMINOPHEN 325 MG PO TABS: 650.0000 mg | ORAL_TABLET | Freq: Four times a day (QID) | ORAL | Status: DC | PRN

## 2020-04-11 MED ORDER — INSULIN ASPART 100 UNIT/ML ~~LOC~~ SOLN
0.0000 [IU] | Freq: Three times a day (TID) | SUBCUTANEOUS | Status: DC
  Administered 2020-04-12: 2 [IU] via SUBCUTANEOUS

## 2020-04-11 MED ORDER — LEVETIRACETAM 500 MG PO TABS
500.0000 mg | ORAL_TABLET | Freq: Two times a day (BID) | ORAL | Status: DC
  Administered 2020-04-11 – 2020-04-12 (×2): 500 mg via ORAL
  Filled 2020-04-11 (×2): qty 1

## 2020-04-11 MED ORDER — SODIUM CHLORIDE 0.9% FLUSH
10.0000 mL | Freq: Two times a day (BID) | INTRAVENOUS | Status: DC
  Administered 2020-04-12: 10 mL

## 2020-04-11 MED ORDER — ONDANSETRON HCL 4 MG/2ML IJ SOLN: 4.0000 mg | Freq: Four times a day (QID) | INTRAMUSCULAR | Status: DC | PRN

## 2020-04-11 MED ORDER — CHLORHEXIDINE GLUCONATE CLOTH 2 % EX PADS
6.0000 | MEDICATED_PAD | Freq: Every day | CUTANEOUS | Status: DC
  Administered 2020-04-11 – 2020-04-12 (×2): 6 via TOPICAL

## 2020-04-11 MED ORDER — ONDANSETRON HCL 4 MG PO TABS: 4.0000 mg | ORAL_TABLET | Freq: Four times a day (QID) | ORAL | Status: DC | PRN

## 2020-04-11 MED ORDER — ALBUTEROL SULFATE (2.5 MG/3ML) 0.083% IN NEBU: 2.5000 mg | INHALATION_SOLUTION | Freq: Four times a day (QID) | RESPIRATORY_TRACT | Status: DC | PRN

## 2020-04-11 MED ORDER — ACETAMINOPHEN 650 MG RE SUPP: 650.0000 mg | Freq: Four times a day (QID) | RECTAL | Status: DC | PRN

## 2020-04-11 MED ORDER — DEXAMETHASONE 4 MG PO TABS
4.0000 mg | ORAL_TABLET | Freq: Three times a day (TID) | ORAL | Status: DC
  Administered 2020-04-11 – 2020-04-12 (×3): 4 mg via ORAL
  Filled 2020-04-11 (×6): qty 1

## 2020-04-11 MED ORDER — PANTOPRAZOLE SODIUM 40 MG PO TBEC
40.0000 mg | DELAYED_RELEASE_TABLET | Freq: Every day | ORAL | Status: DC
  Administered 2020-04-11 – 2020-04-12 (×2): 40 mg via ORAL
  Filled 2020-04-11 (×2): qty 1

## 2020-04-11 MED ORDER — INSULIN ASPART 100 UNIT/ML ~~LOC~~ SOLN: 0.0000 [IU] | Freq: Every day | SUBCUTANEOUS | Status: DC

## 2020-04-11 MED ORDER — PSYLLIUM 95 % PO PACK
1.0000 | PACK | Freq: Every day | ORAL | Status: DC
  Administered 2020-04-12: 1 via ORAL
  Filled 2020-04-11: qty 1

## 2020-04-11 MED ORDER — DEXAMETHASONE 4 MG PO TABS
4.0000 mg | ORAL_TABLET | Freq: Three times a day (TID) | ORAL | 0 refills | Status: DC
Start: 2020-04-11 — End: 2020-07-23

## 2020-04-11 MED ORDER — SODIUM CHLORIDE 0.9% FLUSH
3.0000 mL | Freq: Two times a day (BID) | INTRAVENOUS | Status: DC
  Administered 2020-04-11 – 2020-04-12 (×3): 3 mL via INTRAVENOUS

## 2020-04-11 NOTE — Progress Notes (Signed)
Spoke with the patient's nurse Primitivo Gauze to let her know that the patient is scheduled for CT simulation at the cancer center 04/12/2020 at 1:30 pm.  I asked her to get [facility] set up to have the patient here at 1:00 pm.  She verbalized she would pass this information to her Education officer, museum.  Will continue to follow as necessary.  Gloriajean Dell. Leonie Green, BSN
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
## 2020-04-11 NOTE — Progress Notes (Signed)
Alex Cordova [id number] Admission Data: 04/11/2020 10:39 AM Attending Provider: Norval Morton, MD  XHB:[id number], Zannie Cove, MD  Alex Cordova is a 74 y.o. male patient admitted from ED awake, alert  & orientated  X 3,  No Order, VSS - Blood pressure 114/72, pulse 64, temperature 97.8 F (36.6 C), temperature source Oral, resp. rate 13, height 5' 10.5" (1.791 m), weight 101.7 kg, SpO2 93 %., O2 No c/o shortness of breath, no c/o chest pain, no distress noted. Tele # [id number] placed and pt is currently running:normal sinus rhythm.  Pt orientation to unit, room and routine. Information packet given to patient/family.  Admission INP armband ID verified with patient/family, and in place. SR up x 2, fall risk assessment complete with patient and family verbalizing understanding of risks associated with falls. Pt verbalizes an understanding of how to use the call bell and to call for help before getting out of bed.  Skin, clean-dry- intact without evidence of bruising, or skin  tears.    Will continue to monitor and assist as needed.  Hiram Comber, RN 04/11/2020 10:39 AM
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
## 2020-04-11 NOTE — Progress Notes (Signed)
Dr. Tamala Julian paged to notify of patient's arrival. Awaiting orders.
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
## 2020-04-11 NOTE — Progress Notes (Signed)
Patient scheduled for carelink transfer tomorrow. EMTALA form filled out as much as possible.   IF patient gets restarted on home metformin, medication to be held per [place], RN at the cancer center. Will pass this along in report.

## 2020-04-12 ENCOUNTER — Ambulatory Visit
Admit: 2020-04-12 | Discharge: 2020-04-12 | Disposition: A | Discharge status: Home / Self Care | Payer: No Typology Code available for payment source | Attending: Radiation Oncology | Admitting: Radiation Oncology

## 2020-04-12 DIAGNOSIS — E1169 Type 2 diabetes mellitus with other specified complication: Secondary | ICD-10-CM

## 2020-04-12 DIAGNOSIS — E669 Obesity, unspecified: Secondary | ICD-10-CM

## 2020-04-12 DIAGNOSIS — C7931 Secondary malignant neoplasm of brain: Principal | ICD-10-CM

## 2020-04-12 LAB — CBC
HCT: 43.2 % (ref 39.0–52.0)
Hemoglobin: 13.8 g/dL (ref 13.0–17.0)
MCH: 28.7 pg (ref 26.0–34.0)
MCHC: 31.9 g/dL (ref 30.0–36.0)
MCV: 89.8 fL (ref 80.0–100.0)
Platelets: 208 10*3/uL (ref 150–400)
RBC: 4.81 MIL/uL (ref 4.22–5.81)
RDW: 13.5 % (ref 11.5–15.5)
WBC: 11.2 10*3/uL — ABNORMAL HIGH (ref 4.0–10.5)
nRBC: 0 % (ref 0.0–0.2)

## 2020-04-12 LAB — BASIC METABOLIC PANEL
Anion gap: 7 (ref 5–15)
BUN: 24 mg/dL — ABNORMAL HIGH (ref 8–23)
CO2: 25 mmol/L (ref 22–32)
Calcium: 8.5 mg/dL — ABNORMAL LOW (ref 8.9–10.3)
Chloride: 105 mmol/L (ref 98–111)
Creatinine, Ser: 1.42 mg/dL — ABNORMAL HIGH (ref 0.61–1.24)
GFR calc Af Amer: 56 mL/min — ABNORMAL LOW (ref >60)
GFR calc non Af Amer: 48 mL/min — ABNORMAL LOW (ref >60)
Glucose, Bld: 152 mg/dL — ABNORMAL HIGH (ref 70–99)
Potassium: 4.6 mmol/L (ref 3.5–5.1)
Sodium: 137 mmol/L (ref 135–145)

## 2020-04-12 LAB — GLUCOSE, CAPILLARY
Glucose-Capillary: 129 mg/dL — ABNORMAL HIGH (ref 70–99)
Glucose-Capillary: 160 mg/dL — ABNORMAL HIGH (ref 70–99)

## 2020-04-12 MED ORDER — LEVETIRACETAM 500 MG PO TABS: 500.0000 mg | ORAL_TABLET | Freq: Two times a day (BID) | ORAL | 1 refills | Status: DC

## 2020-04-12 MED ORDER — PANTOPRAZOLE SODIUM 40 MG PO TBEC: 40.0000 mg | DELAYED_RELEASE_TABLET | Freq: Every day | ORAL | 0 refills | Status: DC

## 2020-04-12 NOTE — Progress Notes (Signed)
Pt transferred to [facility] for further testing.

## 2020-04-12 NOTE — Discharge Instructions (Signed)
Follow with Primary MD Melony Overly, MD in 7 days   Get CBC, CMP,   by Primary MD next visit.    Activity: As tolerated with Full fall precautions use walker/cane & assistance as needed   Disposition Home   Diet: Heart Healthy .   On your next visit with your primary care physician please Get Medicines reviewed and adjusted.   Please request your Prim.MD to go over all Hospital Tests and Procedure/Radiological results at the follow up, please get all Hospital records sent to your Prim MD by signing hospital release before you go home.   If you experience worsening of your admission symptoms, develop shortness of breath, life threatening emergency, suicidal or homicidal thoughts you must seek medical attention immediately by calling 911 or calling your MD immediately  if symptoms less severe.  You Must read complete instructions/literature along with all the possible adverse reactions/side effects for all the Medicines you take and that have been prescribed to you. Take any new Medicines after you have completely understood and accpet all the possible adverse reactions/side effects.   Do not drive, operating heavy machinery, perform activities at heights, swimming or participation in water activities or provide baby sitting services if your were admitted for syncope or siezures until you have seen by Primary MD or a Neurologist and advised to do so again.  Do not drive when taking Pain medications.    Do not take more than prescribed Pain, Sleep and Anxiety Medications  Special Instructions: If you have smoked or chewed Tobacco  in the last 2 yrs please stop smoking, stop any regular Alcohol  and or any Recreational drug use.  Wear Seat belts while driving.   Please note  You were cared for by a hospitalist during your hospital stay. If you have any questions about your discharge medications or the care you received while you were in the hospital after you are discharged, you  can call the unit and asked to speak with the hospitalist on call if the hospitalist that took care of you is not available. Once you are discharged, your primary care physician will handle any further medical issues. Please note that NO REFILLS for any discharge medications will be authorized once you are discharged, as it is imperative that you return to your primary care physician (or establish a relationship with a primary care physician if you do not have one) for your aftercare needs so that they can reassess your need for medications and monitor your lab values.
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
## 2020-04-12 NOTE — Care Management (Addendum)
Pt deemed stable for discharge today.  CM unable to reach pt however was able to reach pt's wife.  Wife informed CM that pt is active with the [place] left VM for [place] coordinator.  Pt utilizes [facility] for prescription.  CM reviewed chart for outstanding needs - none identified - CM signing off  [place] [address] Raynelle Dick [place]iva  100% service connected SW with [employer]; Lenor Derrick  [phone]

## 2020-04-15 ENCOUNTER — Telehealth: Payer: Self-pay | Admitting: Radiation Therapy

## 2020-04-15 ENCOUNTER — Other Ambulatory Visit: Payer: Self-pay

## 2020-04-15 ENCOUNTER — Ambulatory Visit
Admit: 2020-04-15 | Discharge: 2020-04-15 | Disposition: A | Discharge status: Home / Self Care | Payer: No Typology Code available for payment source | Attending: Radiation Oncology | Admitting: Radiation Oncology

## 2020-04-15 ENCOUNTER — Inpatient Hospital Stay: Payer: No Typology Code available for payment source | Attending: Radiation Oncology

## 2020-04-15 DIAGNOSIS — C7931 Secondary malignant neoplasm of brain: Secondary | ICD-10-CM | POA: Diagnosis not present

## 2020-04-15 DIAGNOSIS — E669 Obesity, unspecified: Secondary | ICD-10-CM

## 2020-04-15 DIAGNOSIS — C3490 Malignant neoplasm of unspecified part of unspecified bronchus or lung: Secondary | ICD-10-CM | POA: Diagnosis not present

## 2020-04-15 DIAGNOSIS — E1169 Type 2 diabetes mellitus with other specified complication: Secondary | ICD-10-CM

## 2020-04-15 LAB — BUN & CREATININE (CHCC)
BUN: 28 mg/dL — ABNORMAL HIGH (ref 8–23)
Creatinine: 1.27 mg/dL — ABNORMAL HIGH (ref 0.61–1.24)
GFR, Est AFR Am: >60 mL/min (ref >60)
GFR, Estimated: 55 mL/min — ABNORMAL LOW (ref >60)

## 2020-04-15 NOTE — Telephone Encounter (Signed)
Called to let Alex Cordova know that he can resume his Metformin as prescribed based on the labs drawn earlier today. I spoke with Alex Cordova, she understood the instructions and was thankful for the call.  Mont Dutton R.T.(R)(T) Radiation Special Procedures Navigator

## 2020-04-16 ENCOUNTER — Telehealth: Payer: Self-pay | Admitting: Radiation Oncology

## 2020-04-16 NOTE — Telephone Encounter (Signed)
-----   Message from Hayden Pedro, PA-C sent at 04/11/2020  4:08 PM EDT ----- Regarding: 04/16/20 Should be out pt and on dex 4 mg TID, if better consider BID  Srs on Thursday surgery friday

## 2020-04-16 NOTE — Telephone Encounter (Signed)
I called the patient's house today and spoke with his wife. Our original plan was to try to drop his dexamethasone dosing today if he was doing better. His wife tells me he is doing great but that they would like to keep his current steroid dosing rather than dropping this before his treatment. I think that is reasonable as they understand the risks of oral steroid medication, however I'll copy Dr. Saintclair Halsted as well to make sure he's comfortable with this given the patient's upcoming surgery.    Carola Rhine, PAC

## 2020-04-18 ENCOUNTER — Other Ambulatory Visit (HOSPITAL_COMMUNITY)
Admission: RE | Admit: 2020-04-18 | Discharge: 2020-04-18 | Disposition: A | Discharge status: Home / Self Care | Payer: No Typology Code available for payment source | Source: Ambulatory Visit | Attending: Neurosurgery | Admitting: Neurosurgery

## 2020-04-18 ENCOUNTER — Other Ambulatory Visit: Payer: Self-pay

## 2020-04-18 ENCOUNTER — Encounter: Payer: Self-pay | Admitting: Radiation Oncology

## 2020-04-18 ENCOUNTER — Encounter (HOSPITAL_COMMUNITY): Payer: Self-pay | Admitting: Neurosurgery

## 2020-04-18 ENCOUNTER — Ambulatory Visit
Admission: RE | Admit: 2020-04-18 | Discharge: 2020-04-18 | Disposition: A | Discharge status: Home / Self Care | Payer: No Typology Code available for payment source | Source: Ambulatory Visit | Attending: Radiation Oncology | Admitting: Radiation Oncology

## 2020-04-18 DIAGNOSIS — Z01812 Encounter for preprocedural laboratory examination: Secondary | ICD-10-CM | POA: Diagnosis present

## 2020-04-18 DIAGNOSIS — Z20822 Contact with and (suspected) exposure to covid-19: Secondary | ICD-10-CM | POA: Diagnosis not present

## 2020-04-18 DIAGNOSIS — C7931 Secondary malignant neoplasm of brain: Secondary | ICD-10-CM | POA: Diagnosis not present

## 2020-04-18 LAB — SARS CORONAVIRUS 2 (TAT 6-24 HRS): SARS Coronavirus 2: NEGATIVE

## 2020-04-18 NOTE — Progress Notes (Signed)
Pt denies SOB, chest pain, and being under the care of a cardiologist. Pt stated that PCP is Dr. Carie Caddy at [facility]. Pt stated that Oncologist is Dr. Bobby Rumpf. Pt denies having an echo and cardiac cath but stated that a stress test was performed > 10 years ago. Pt denies having an EKG and chest x ray in the last year. Pt made aware to stop taking  Aspirin (unless otherwise advised by surgeon), vitamins, fish oil and herbal medications. Do not take any NSAIDs ie: Ibuprofen, Advil, Naproxen (Aleve), Motrin, BC and Goody Powder. Pt stated that fasting CBG is 120. Pt made aware to hold Metformin DOS. Pt made aware to check CBG every 2 hours prior to arrival to hospital on DOS. Pt made aware to treat a CBG < 70 with 4  ounces of apple or cranberry juice, wait 15 minutes after intervention to recheck CBG, if CBG remains < 70, call Short Stay unit to speak with a nurse. Pt reminded to quarantine. Pt verbalized understanding of all pre-op instructions.

## 2020-04-18 NOTE — Progress Notes (Signed)
Mr. Raudenbush rested with [place] for 15 minutes following his SRS treatment.  Patient denies headache, dizziness, nausea, diplopia or ringing in the ears. Denies fatigue. Patient without complaints. Understands to avoid strenuous activity for the next 24 hours and call [phone] with needs.   BP (!) 152/75 (BP Location: Left Arm, Patient Position: Sitting, Cuff Size: Large)   Pulse (!) 50   Temp 98.3 F (36.8 C)   Resp 20   SpO2 99%    Ahmod Gillespie M. Leonie Green, BSN

## 2020-04-19 ENCOUNTER — Encounter (HOSPITAL_COMMUNITY)
Admission: RE | Disposition: A | Discharge status: Home / Self Care | Payer: Self-pay | Source: Home / Self Care | Attending: Neurosurgery

## 2020-04-19 ENCOUNTER — Inpatient Hospital Stay (HOSPITAL_COMMUNITY): Payer: No Typology Code available for payment source | Admitting: Anesthesiology

## 2020-04-19 ENCOUNTER — Encounter (HOSPITAL_COMMUNITY): Payer: Self-pay | Admitting: Neurosurgery

## 2020-04-19 ENCOUNTER — Other Ambulatory Visit: Payer: Self-pay | Admitting: Anatomic Pathology & Clinical Pathology

## 2020-04-19 ENCOUNTER — Inpatient Hospital Stay (HOSPITAL_COMMUNITY)
Admission: RE | Admit: 2020-04-19 | Discharge: 2020-04-21 | DRG: 027 | Disposition: A | Discharge status: Home / Self Care | Payer: No Typology Code available for payment source | Attending: Neurosurgery | Admitting: Neurosurgery

## 2020-04-19 DIAGNOSIS — Z87891 Personal history of nicotine dependence: Secondary | ICD-10-CM

## 2020-04-19 DIAGNOSIS — Z882 Allergy status to sulfonamides status: Secondary | ICD-10-CM | POA: Diagnosis not present

## 2020-04-19 DIAGNOSIS — Z86010 Personal history of colonic polyps: Secondary | ICD-10-CM | POA: Diagnosis not present

## 2020-04-19 DIAGNOSIS — Z7984 Long term (current) use of oral hypoglycemic drugs: Secondary | ICD-10-CM | POA: Diagnosis not present

## 2020-04-19 DIAGNOSIS — K219 Gastro-esophageal reflux disease without esophagitis: Secondary | ICD-10-CM | POA: Diagnosis not present

## 2020-04-19 DIAGNOSIS — Z85118 Personal history of other malignant neoplasm of bronchus and lung: Secondary | ICD-10-CM | POA: Diagnosis not present

## 2020-04-19 DIAGNOSIS — E119 Type 2 diabetes mellitus without complications: Secondary | ICD-10-CM | POA: Diagnosis not present

## 2020-04-19 DIAGNOSIS — C3492 Malignant neoplasm of unspecified part of left bronchus or lung: Secondary | ICD-10-CM | POA: Diagnosis not present

## 2020-04-19 DIAGNOSIS — D496 Neoplasm of unspecified behavior of brain: Secondary | ICD-10-CM | POA: Diagnosis present

## 2020-04-19 DIAGNOSIS — M1990 Unspecified osteoarthritis, unspecified site: Secondary | ICD-10-CM | POA: Diagnosis present

## 2020-04-19 DIAGNOSIS — C3490 Malignant neoplasm of unspecified part of unspecified bronchus or lung: Secondary | ICD-10-CM | POA: Diagnosis not present

## 2020-04-19 DIAGNOSIS — C7931 Secondary malignant neoplasm of brain: Principal | ICD-10-CM | POA: Diagnosis present

## 2020-04-19 DIAGNOSIS — Z79899 Other long term (current) drug therapy: Secondary | ICD-10-CM | POA: Diagnosis not present

## 2020-04-19 HISTORY — PX: CRANIOTOMY: SHX93

## 2020-04-19 HISTORY — PX: APPLICATION OF CRANIAL NAVIGATION: SHX6578

## 2020-04-19 HISTORY — DX: Complete loss of teeth, unspecified cause, unspecified class: Z97.2

## 2020-04-19 HISTORY — DX: Presence of spectacles and contact lenses: Z97.3

## 2020-04-19 HISTORY — DX: Complete loss of teeth, unspecified cause, unspecified class: K08.109

## 2020-04-19 HISTORY — DX: Gastro-esophageal reflux disease without esophagitis: K21.9

## 2020-04-19 HISTORY — DX: Unspecified osteoarthritis, unspecified site: M19.90

## 2020-04-19 HISTORY — DX: Pneumonia, unspecified organism: J18.9

## 2020-04-19 HISTORY — DX: Secondary malignant neoplasm of brain: C79.31

## 2020-04-19 LAB — CBC
HCT: 44.8 % (ref 39.0–52.0)
Hemoglobin: 14.8 g/dL (ref 13.0–17.0)
MCH: 28.8 pg (ref 26.0–34.0)
MCHC: 33 g/dL (ref 30.0–36.0)
MCV: 87.2 fL (ref 80.0–100.0)
Platelets: 204 10*3/uL (ref 150–400)
RBC: 5.14 MIL/uL (ref 4.22–5.81)
RDW: 13.8 % (ref 11.5–15.5)
WBC: 13.9 10*3/uL — ABNORMAL HIGH (ref 4.0–10.5)
nRBC: 0 % (ref 0.0–0.2)

## 2020-04-19 LAB — BASIC METABOLIC PANEL
Anion gap: 11 (ref 5–15)
BUN: 26 mg/dL — ABNORMAL HIGH (ref 8–23)
CO2: 21 mmol/L — ABNORMAL LOW (ref 22–32)
Calcium: 7.9 mg/dL — ABNORMAL LOW (ref 8.9–10.3)
Chloride: 103 mmol/L (ref 98–111)
Creatinine, Ser: 1.28 mg/dL — ABNORMAL HIGH (ref 0.61–1.24)
GFR calc Af Amer: >60 mL/min (ref >60)
GFR calc non Af Amer: 55 mL/min — ABNORMAL LOW (ref >60)
Glucose, Bld: 168 mg/dL — ABNORMAL HIGH (ref 70–99)
Potassium: 4.2 mmol/L (ref 3.5–5.1)
Sodium: 135 mmol/L (ref 135–145)

## 2020-04-19 LAB — GLUCOSE, CAPILLARY
Glucose-Capillary: 133 mg/dL — ABNORMAL HIGH (ref 70–99)
Glucose-Capillary: 101 mg/dL — ABNORMAL HIGH (ref 70–99)

## 2020-04-19 LAB — ABO/RH: ABO/RH(D): O POS

## 2020-04-19 LAB — TYPE AND SCREEN
ABO/RH(D): O POS
Antibody Screen: NEGATIVE

## 2020-04-19 SURGERY — CRANIOTOMY TUMOR EXCISION
Anesthesia: General | Site: Head | Laterality: Right

## 2020-04-19 MED ORDER — LABETALOL HCL 5 MG/ML IV SOLN
INTRAVENOUS | Status: DC | PRN
Start: 2020-04-19 — End: 2020-04-19
  Administered 2020-04-19 (×3): 10 mg via INTRAVENOUS

## 2020-04-19 MED ORDER — SODIUM CHLORIDE 0.9 % IV SOLN: INTRAVENOUS | Status: DC | PRN

## 2020-04-19 MED ORDER — PHENYLEPHRINE 40 MCG/ML (10ML) SYRINGE FOR IV PUSH (FOR BLOOD PRESSURE SUPPORT)
PREFILLED_SYRINGE | INTRAVENOUS | Status: AC
  Filled 2020-04-19: qty 10

## 2020-04-19 MED ORDER — CEFAZOLIN SODIUM-DEXTROSE 2-4 GM/100ML-% IV SOLN
2.0000 g | Freq: Three times a day (TID) | INTRAVENOUS | Status: AC
  Administered 2020-04-19 – 2020-04-20 (×2): 2 g via INTRAVENOUS
  Filled 2020-04-19 (×2): qty 100

## 2020-04-19 MED ORDER — HYDROCODONE-ACETAMINOPHEN 5-325 MG PO TABS
1.0000 | ORAL_TABLET | ORAL | Status: DC | PRN
  Administered 2020-04-20 – 2020-04-21 (×6): 1 via ORAL
  Filled 2020-04-19 (×6): qty 1

## 2020-04-19 MED ORDER — LABETALOL HCL 5 MG/ML IV SOLN
INTRAVENOUS | Status: AC
  Filled 2020-04-19: qty 4

## 2020-04-19 MED ORDER — EPHEDRINE 5 MG/ML INJ
INTRAVENOUS | Status: AC
  Filled 2020-04-19: qty 10

## 2020-04-19 MED ORDER — DEXAMETHASONE SODIUM PHOSPHATE 4 MG/ML IJ SOLN
4.0000 mg | Freq: Four times a day (QID) | INTRAMUSCULAR | Status: DC
  Administered 2020-04-21 (×2): 4 mg via INTRAVENOUS
  Filled 2020-04-19: qty 1

## 2020-04-19 MED ORDER — SUGAMMADEX SODIUM 200 MG/2ML IV SOLN
INTRAVENOUS | Status: DC | PRN
  Administered 2020-04-19: 300 mg via INTRAVENOUS

## 2020-04-19 MED ORDER — LIDOCAINE-EPINEPHRINE 1 %-1:100000 IJ SOLN
INTRAMUSCULAR | Status: AC
  Filled 2020-04-19: qty 1

## 2020-04-19 MED ORDER — DEXAMETHASONE SODIUM PHOSPHATE 4 MG/ML IJ SOLN
4.0000 mg | Freq: Three times a day (TID) | INTRAMUSCULAR | Status: DC
  Filled 2020-04-19: qty 1

## 2020-04-19 MED ORDER — DOCUSATE SODIUM 100 MG PO CAPS
100.0000 mg | ORAL_CAPSULE | Freq: Two times a day (BID) | ORAL | Status: DC
  Administered 2020-04-19 – 2020-04-21 (×4): 100 mg via ORAL
  Filled 2020-04-19 (×4): qty 1

## 2020-04-19 MED ORDER — FENTANYL CITRATE (PF) 250 MCG/5ML IJ SOLN
INTRAMUSCULAR | Status: DC | PRN
  Administered 2020-04-19: 250 ug via INTRAVENOUS

## 2020-04-19 MED ORDER — DEXAMETHASONE SODIUM PHOSPHATE 10 MG/ML IJ SOLN
INTRAMUSCULAR | Status: AC
  Filled 2020-04-19: qty 1

## 2020-04-19 MED ORDER — CEFAZOLIN SODIUM-DEXTROSE 2-4 GM/100ML-% IV SOLN
2.0000 g | INTRAVENOUS | Status: AC
  Administered 2020-04-19: 2 g via INTRAVENOUS
  Filled 2020-04-19: qty 100

## 2020-04-19 MED ORDER — MIDAZOLAM HCL 5 MG/5ML IJ SOLN
INTRAMUSCULAR | Status: DC | PRN
  Administered 2020-04-19: 1 mg via INTRAVENOUS

## 2020-04-19 MED ORDER — PROMETHAZINE HCL 25 MG/ML IJ SOLN: 6.2500 mg | INTRAMUSCULAR | Status: DC | PRN

## 2020-04-19 MED ORDER — LACTATED RINGERS IV SOLN: INTRAVENOUS | Status: DC

## 2020-04-19 MED ORDER — DEXAMETHASONE SODIUM PHOSPHATE 10 MG/ML IJ SOLN
INTRAMUSCULAR | Status: DC | PRN
Start: 2020-04-19 — End: 2020-04-19
  Administered 2020-04-19: 10 mg via INTRAVENOUS

## 2020-04-19 MED ORDER — ONDANSETRON HCL 4 MG/2ML IJ SOLN
INTRAMUSCULAR | Status: DC | PRN
  Administered 2020-04-19: 4 mg via INTRAVENOUS

## 2020-04-19 MED ORDER — FENTANYL CITRATE (PF) 250 MCG/5ML IJ SOLN
INTRAMUSCULAR | Status: AC
  Filled 2020-04-19: qty 5

## 2020-04-19 MED ORDER — PANTOPRAZOLE SODIUM 40 MG PO TBEC
40.0000 mg | DELAYED_RELEASE_TABLET | Freq: Every day | ORAL | Status: DC
  Administered 2020-04-19 – 2020-04-21 (×3): 40 mg via ORAL
  Filled 2020-04-19 (×3): qty 1

## 2020-04-19 MED ORDER — LIDOCAINE 2% (20 MG/ML) 5 ML SYRINGE
INTRAMUSCULAR | Status: AC
  Filled 2020-04-19: qty 5

## 2020-04-19 MED ORDER — LIDOCAINE-EPINEPHRINE 1 %-1:100000 IJ SOLN
INTRAMUSCULAR | Status: DC | PRN
  Administered 2020-04-19: 10 mL

## 2020-04-19 MED ORDER — MIDAZOLAM HCL 2 MG/2ML IJ SOLN: 0.5000 mg | Freq: Once | INTRAMUSCULAR | Status: DC | PRN

## 2020-04-19 MED ORDER — LABETALOL HCL 5 MG/ML IV SOLN: INTRAVENOUS | Status: DC | PRN

## 2020-04-19 MED ORDER — HYDROMORPHONE HCL 1 MG/ML IJ SOLN
INTRAMUSCULAR | Status: AC
  Filled 2020-04-19: qty 1

## 2020-04-19 MED ORDER — DEXAMETHASONE 4 MG PO TABS: 4.0000 mg | ORAL_TABLET | Freq: Three times a day (TID) | ORAL | Status: DC

## 2020-04-19 MED ORDER — CHLORHEXIDINE GLUCONATE CLOTH 2 % EX PADS: 6.0000 | MEDICATED_PAD | Freq: Once | CUTANEOUS | Status: DC

## 2020-04-19 MED ORDER — ROCURONIUM BROMIDE 10 MG/ML (PF) SYRINGE
PREFILLED_SYRINGE | INTRAVENOUS | Status: AC
  Filled 2020-04-19: qty 20

## 2020-04-19 MED ORDER — ONDANSETRON HCL 4 MG/2ML IJ SOLN
INTRAMUSCULAR | Status: AC
  Filled 2020-04-19: qty 2

## 2020-04-19 MED ORDER — ONDANSETRON HCL 4 MG/2ML IJ SOLN: 4.0000 mg | INTRAMUSCULAR | Status: DC | PRN

## 2020-04-19 MED ORDER — BACITRACIN ZINC 500 UNIT/GM EX OINT
TOPICAL_OINTMENT | CUTANEOUS | Status: DC | PRN
  Administered 2020-04-19: 1 via TOPICAL

## 2020-04-19 MED ORDER — LABETALOL HCL 5 MG/ML IV SOLN
10.0000 mg | INTRAVENOUS | Status: DC | PRN
  Administered 2020-04-19: 5 mg via INTRAVENOUS

## 2020-04-19 MED ORDER — DEXAMETHASONE SODIUM PHOSPHATE 10 MG/ML IJ SOLN
6.0000 mg | Freq: Four times a day (QID) | INTRAMUSCULAR | Status: AC
  Administered 2020-04-19 – 2020-04-20 (×4): 6 mg via INTRAVENOUS
  Filled 2020-04-19 (×4): qty 1

## 2020-04-19 MED ORDER — THROMBIN 5000 UNITS EX SOLR
CUTANEOUS | Status: AC
  Filled 2020-04-19: qty 5000

## 2020-04-19 MED ORDER — BACITRACIN ZINC 500 UNIT/GM EX OINT
TOPICAL_OINTMENT | CUTANEOUS | Status: AC
  Filled 2020-04-19: qty 28.35

## 2020-04-19 MED ORDER — METFORMIN HCL 500 MG PO TABS
500.0000 mg | ORAL_TABLET | Freq: Every day | ORAL | Status: DC
  Administered 2020-04-20 – 2020-04-21 (×2): 500 mg via ORAL
  Filled 2020-04-19 (×2): qty 1

## 2020-04-19 MED ORDER — POTASSIUM CHLORIDE IN NACL 20-0.9 MEQ/L-% IV SOLN
INTRAVENOUS | Status: DC
  Filled 2020-04-19 (×2): qty 1000

## 2020-04-19 MED ORDER — ONDANSETRON HCL 4 MG PO TABS: 4.0000 mg | ORAL_TABLET | ORAL | Status: DC | PRN

## 2020-04-19 MED ORDER — LEVETIRACETAM 500 MG PO TABS: 500.0000 mg | ORAL_TABLET | Freq: Two times a day (BID) | ORAL | Status: DC

## 2020-04-19 MED ORDER — PROPOFOL 10 MG/ML IV BOLUS
INTRAVENOUS | Status: DC | PRN
  Administered 2020-04-19: 50 ug via INTRAVENOUS
  Administered 2020-04-19: 150 ug via INTRAVENOUS

## 2020-04-19 MED ORDER — HYDROMORPHONE HCL 1 MG/ML IJ SOLN: 0.2500 mg | INTRAMUSCULAR | Status: DC | PRN

## 2020-04-19 MED ORDER — THROMBIN 20000 UNITS EX SOLR
CUTANEOUS | Status: AC
  Filled 2020-04-19: qty 20000

## 2020-04-19 MED ORDER — LIDOCAINE HCL (CARDIAC) PF 100 MG/5ML IV SOSY
PREFILLED_SYRINGE | INTRAVENOUS | Status: DC | PRN
  Administered 2020-04-19: 30 mg via INTRAVENOUS

## 2020-04-19 MED ORDER — PHENYLEPHRINE HCL-NACL 10-0.9 MG/250ML-% IV SOLN
INTRAVENOUS | Status: DC | PRN
Start: 2020-04-19 — End: 2020-04-19
  Administered 2020-04-19: 50 ug/min via INTRAVENOUS
  Administered 2020-04-19: 25 ug/min via INTRAVENOUS

## 2020-04-19 MED ORDER — PROMETHAZINE HCL 25 MG PO TABS: 12.5000 mg | ORAL_TABLET | ORAL | Status: DC | PRN

## 2020-04-19 MED ORDER — MIDAZOLAM HCL 2 MG/2ML IJ SOLN
INTRAMUSCULAR | Status: AC
  Filled 2020-04-19: qty 2

## 2020-04-19 MED ORDER — THROMBIN 5000 UNITS EX SOLR
OROMUCOSAL | Status: DC | PRN
  Administered 2020-04-19: 5 mL

## 2020-04-19 MED ORDER — MEPERIDINE HCL 25 MG/ML IJ SOLN: 6.2500 mg | INTRAMUSCULAR | Status: DC | PRN

## 2020-04-19 MED ORDER — PROPOFOL 10 MG/ML IV BOLUS
INTRAVENOUS | Status: AC
  Filled 2020-04-19: qty 40

## 2020-04-19 MED ORDER — ROCURONIUM BROMIDE 10 MG/ML (PF) SYRINGE
PREFILLED_SYRINGE | INTRAVENOUS | Status: DC | PRN
  Administered 2020-04-19: 50 mg via INTRAVENOUS
  Administered 2020-04-19: 20 mg via INTRAVENOUS
  Administered 2020-04-19: 10 mg via INTRAVENOUS

## 2020-04-19 MED ORDER — SODIUM CHLORIDE 0.9 % IR SOLN
Status: DC | PRN
  Administered 2020-04-19: 2000 mL

## 2020-04-19 MED ORDER — LEVETIRACETAM IN NACL 500 MG/100ML IV SOLN
500.0000 mg | Freq: Two times a day (BID) | INTRAVENOUS | Status: DC
  Administered 2020-04-19 – 2020-04-21 (×4): 500 mg via INTRAVENOUS
  Filled 2020-04-19 (×4): qty 100

## 2020-04-19 MED ORDER — THROMBIN 20000 UNITS EX SOLR
CUTANEOUS | Status: DC | PRN
  Administered 2020-04-19: 20 mL

## 2020-04-19 MED ORDER — SODIUM CHLORIDE 0.9 % IV SOLN
INTRAVENOUS | Status: DC | PRN
  Administered 2020-04-19: 500 mL

## 2020-04-19 MED ORDER — HYDROMORPHONE HCL 1 MG/ML IJ SOLN
0.5000 mg | INTRAMUSCULAR | Status: DC | PRN
  Administered 2020-04-19 (×2): 1 mg via INTRAVENOUS
  Filled 2020-04-19: qty 1

## 2020-04-19 MED ORDER — PANTOPRAZOLE SODIUM 40 MG IV SOLR
40.0000 mg | Freq: Every day | INTRAVENOUS | Status: DC
Start: 2020-04-19 — End: 2020-04-19

## 2020-04-19 MED ORDER — PSYLLIUM 95 % PO PACK
1.0000 | PACK | Freq: Every day | ORAL | Status: DC
  Administered 2020-04-21: 1 via ORAL
  Filled 2020-04-19 (×3): qty 1

## 2020-04-19 SURGICAL SUPPLY — 67 items
BAG DECANTER FOR FLEXI CONT (MISCELLANEOUS) ×3 IMPLANT
BAND RUBBER #18 3X1/16 STRL (MISCELLANEOUS) IMPLANT
BLADE CLIPPER SURG (BLADE) ×3 IMPLANT
BLADE SURG 11 STRL SS (BLADE) ×3 IMPLANT
BNDG COHESIVE 4X5 TAN STRL (GAUZE/BANDAGES/DRESSINGS) ×3 IMPLANT
BNDG STRETCH 4X75 STRL LF (GAUZE/BANDAGES/DRESSINGS) ×3 IMPLANT
BUR ACORN 9.0 PRECISION (BURR) ×3 IMPLANT
BUR SPIRAL ROUTER 2.3 (BUR) IMPLANT
CANISTER SUCT 3000ML PPV (MISCELLANEOUS) ×6 IMPLANT
CARTRIDGE OIL MAESTRO DRILL (MISCELLANEOUS) ×2 IMPLANT
CLIP VESOCCLUDE MED 6/CT (CLIP) ×3 IMPLANT
CNTNR URN SCR LID CUP LEK RST (MISCELLANEOUS) ×4 IMPLANT
CONT SPEC 4OZ STRL OR WHT (MISCELLANEOUS) ×6
COVER BURR HOLE 7 (Orthopedic Implant) ×3 IMPLANT
COVER WAND RF STERILE (DRAPES) ×3 IMPLANT
DECANTER SPIKE VIAL GLASS SM (MISCELLANEOUS) ×3 IMPLANT
DERMABOND ADVANCED (GAUZE/BANDAGES/DRESSINGS) ×1
DERMABOND ADVANCED .7 DNX12 (GAUZE/BANDAGES/DRESSINGS) ×2 IMPLANT
DIFFUSER DRILL AIR PNEUMATIC (MISCELLANEOUS) ×3 IMPLANT
DRAPE MICROSCOPE LEICA (MISCELLANEOUS) IMPLANT
DRAPE NEUROLOGICAL W/INCISE (DRAPES) ×3 IMPLANT
DRAPE STERI IOBAN 125X83 (DRAPES) IMPLANT
DRAPE WARM FLUID 44X44 (DRAPES) ×3 IMPLANT
DRSG OPSITE 4X5.5 SM (GAUZE/BANDAGES/DRESSINGS) ×3 IMPLANT
ELECT REM PT RETURN 9FT ADLT (ELECTROSURGICAL) ×3
ELECTRODE REM PT RTRN 9FT ADLT (ELECTROSURGICAL) ×2 IMPLANT
FORCEPS BIPOLAR SPETZLER 8 1.0 (NEUROSURGERY SUPPLIES) IMPLANT
GAUZE 4X4 16PLY RFD (DISPOSABLE) IMPLANT
GAUZE SPONGE 4X4 12PLY STRL (GAUZE/BANDAGES/DRESSINGS) ×3 IMPLANT
GLOVE BIO SURGEON STRL SZ 6.5 (GLOVE) IMPLANT
GLOVE BIO SURGEON STRL SZ8 (GLOVE) ×3 IMPLANT
GLOVE INDICATOR 6.5 STRL GRN (GLOVE) IMPLANT
GLOVE INDICATOR 8.5 STRL (GLOVE) ×3 IMPLANT
GOWN STRL REUS W/ TWL LRG LVL3 (GOWN DISPOSABLE) ×2 IMPLANT
GOWN STRL REUS W/ TWL XL LVL3 (GOWN DISPOSABLE) ×2 IMPLANT
GOWN STRL REUS W/TWL 2XL LVL3 (GOWN DISPOSABLE) ×3 IMPLANT
GOWN STRL REUS W/TWL LRG LVL3 (GOWN DISPOSABLE) ×3
GOWN STRL REUS W/TWL XL LVL3 (GOWN DISPOSABLE) ×3
GRAFT DURAGEN MATRIX 1WX1L (Tissue) ×6 IMPLANT
HEMOSTAT POWDER KIT SURGIFOAM (HEMOSTASIS) IMPLANT
HEMOSTAT SURGICEL 2X14 (HEMOSTASIS) IMPLANT
KIT BASIN OR (CUSTOM PROCEDURE TRAY) ×3 IMPLANT
KIT TURNOVER KIT B (KITS) ×3 IMPLANT
MARKER SPHERE PSV REFLC 13MM (MARKER) ×6 IMPLANT
NEEDLE HYPO 25X1 1.5 SAFETY (NEEDLE) ×3 IMPLANT
NS IRRIG 1000ML POUR BTL (IV SOLUTION) ×6 IMPLANT
OIL CARTRIDGE MAESTRO DRILL (MISCELLANEOUS) ×3
PACK CRANIOTOMY CUSTOM (CUSTOM PROCEDURE TRAY) ×3 IMPLANT
PAD ARMBOARD 7.5X6 YLW CONV (MISCELLANEOUS) ×9 IMPLANT
PATTIES SURGICAL .25X.25 (GAUZE/BANDAGES/DRESSINGS) ×3 IMPLANT
PATTIES SURGICAL .5 X.5 (GAUZE/BANDAGES/DRESSINGS) ×3 IMPLANT
PATTIES SURGICAL .5 X3 (DISPOSABLE) IMPLANT
PATTIES SURGICAL 1X1 (DISPOSABLE) IMPLANT
PLATE UNIV CMF 16 2H (Plate) ×9 IMPLANT
SCREW UNIII AXS SD 1.5X4 (Screw) ×30 IMPLANT
SPONGE NEURO XRAY DETECT 1X3 (DISPOSABLE) IMPLANT
SPONGE SURGIFOAM ABS GEL 100 (HEMOSTASIS) IMPLANT
STAPLER VISISTAT 35W (STAPLE) ×3 IMPLANT
SUT ETHILON 3 0 FSL (SUTURE) ×6 IMPLANT
SUT NURALON 4 0 TR CR/8 (SUTURE) ×9 IMPLANT
SUT VIC AB 2-0 CT1 18 (SUTURE) ×3 IMPLANT
TOWEL GREEN STERILE (TOWEL DISPOSABLE) ×3 IMPLANT
TOWEL GREEN STERILE FF (TOWEL DISPOSABLE) IMPLANT
TRAY FOLEY MTR SLVR 16FR STAT (SET/KITS/TRAYS/PACK) ×3 IMPLANT
TUBE CONNECTING 12X1/4 (SUCTIONS) ×3 IMPLANT
UNDERPAD 30X36 HEAVY ABSORB (UNDERPADS AND DIAPERS) ×3 IMPLANT
WATER STERILE IRR 1000ML POUR (IV SOLUTION) ×3 IMPLANT
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
## 2020-04-19 NOTE — Transfer of Care (Signed)
Immediate Anesthesia Transfer of Care Note  Patient: Alex Cordova  Procedure(s) Performed: Right Temporal stereotactic craniotomy (Right ) APPLICATION OF CRANIAL NAVIGATION (N/A Head)  Patient Location: PACU  Anesthesia Type:General  Level of Consciousness: awake and alert   Airway & Oxygen Therapy: Patient Spontanous Breathing and Patient connected to nasal cannula oxygen  Post-op Assessment: Report given to RN and Post -op Vital signs reviewed and stable  Post vital signs: Reviewed and stable  Last Vitals:  Vitals Value Taken Time  BP 152/92 04/19/20 1720  Temp    Pulse 66 04/19/20 1724  Resp 14 04/19/20 1724  SpO2 97 % 04/19/20 1724  Vitals shown include unvalidated device data.  Last Pain:  Vitals:   04/19/20 1323  TempSrc:   PainSc: 0-No pain      Patients Stated Pain Goal: 3 (51/89/84 2103)  Complications: No apparent anesthesia complications

## 2020-04-19 NOTE — Progress Notes (Signed)
Dilaudid 1 mg given. Could not chart, locked by pharmacist.

## 2020-04-19 NOTE — Progress Notes (Signed)
Belongings are dentures, glasses,  Clothing and cell phone.

## 2020-04-19 NOTE — Anesthesia Procedure Notes (Signed)
Procedure Name: Intubation Date/Time: 04/19/2020 2:37 PM Performed by: Eligha Bridegroom, CRNA Pre-anesthesia Checklist: Patient identified, Emergency Drugs available, Suction available, Patient being monitored and Timeout performed Patient Re-evaluated:Patient Re-evaluated prior to induction Oxygen Delivery Method: Circle system utilized Preoxygenation: Pre-oxygenation with 100% oxygen Induction Type: IV induction Ventilation: Mask ventilation without difficulty and Oral airway inserted - appropriate to patient size Laryngoscope Size: Mac and 4 Grade View: Grade II Tube type: Oral Tube size: 7.5 mm Airway Equipment and Method: Stylet Placement Confirmation: ETT inserted through vocal cords under direct vision,  positive ETCO2 and breath sounds checked- equal and bilateral Secured at: 22 cm Tube secured with: Tape Dental Injury: Teeth and Oropharynx as per pre-operative assessment
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
## 2020-04-19 NOTE — Procedures (Signed)
  Name: Alex Cordova  MRN: [id number]  Date: 04/18/2020   DOB: May 07, 1946  Stereotactic Radiosurgery Operative Note  PRE-OPERATIVE DIAGNOSIS:  Solitary Brain Metastasis  POST-OPERATIVE DIAGNOSIS:  Solitary Brain Metastasis  PROCEDURE:  Stereotactic Radiosurgery  SURGEON:  Liesel Peckenpaugh P, MD  NARRATIVE: The patient underwent a radiation treatment planning session in the radiation oncology simulation suite under the care of the radiation oncology physician and physicist.  I participated closely in the radiation treatment planning afterwards. The patient underwent planning CT which was fused to 3T high resolution MRI with 1 mm axial slices.  These images were fused on the planning system.  We contoured the gross target volumes and subsequently expanded this to yield the Planning Target Volume. I actively participated in the planning process.  I helped to define and review the target contours and also the contours of the optic pathway, eyes, brainstem and selected nearby organs at risk.  All the dose constraints for critical structures were reviewed and compared to AAPM Task Group 101.  The prescription dose conformity was reviewed.  I approved the plan electronically.    Accordingly, [name] was brought to the TrueBeam stereotactic radiation treatment linac and placed in the custom immobilization mask.  The patient was aligned according to the IR fiducial markers with Brain[facility] Exactrac, then orthogonal x-rays were used in ExacTrac with the 6DOF robotic table and the shifts were made to align the patient  [name] received stereotactic radiosurgery uneventfully.    The detailed description of the procedure is recorded in the radiation oncology procedure note.  I was present for the duration of the procedure.  DISPOSITION:  Following delivery, the patient was transported to nursing in stable condition and monitored for possible acute effects to be discharged to home in  stable condition with follow-up in one month.  Piya Mesch P, MD 04/19/2020 4:43 PM

## 2020-04-19 NOTE — Anesthesia Procedure Notes (Signed)
Arterial Line Insertion Start/End5/14/2021 2:31 PM Performed by: Eligha Bridegroom, CRNA, CRNA  Patient location: OR. Preanesthetic checklist: patient identified, IV checked, site marked, risks and benefits discussed, surgical consent, monitors and equipment checked, pre-op evaluation and timeout performed radial was placed Catheter size: 20 G Hand hygiene performed  and maximum sterile barriers used   Attempts: 1 Procedure performed without using ultrasound guided technique. Following insertion, dressing applied and Biopatch. Post procedure assessment: normal  Patient tolerated the procedure well with no immediate complications.

## 2020-04-19 NOTE — Anesthesia Preprocedure Evaluation (Addendum)
Anesthesia Evaluation  Patient identified by MRN, date of birth, ID band Patient awake    Reviewed: Allergy & Precautions, NPO status , Patient's Chart, lab work & pertinent test results  History of Anesthesia Complications Negative for: history of anesthetic complications  Airway Mallampati: I  TM Distance: >3 FB Neck ROM: Full    Dental  (+) Edentulous Upper, Edentulous Lower   Pulmonary former smoker,  H/o lung cancer LUL: brain mets  04/18/2020 SARS Coronavirus NEG   breath sounds clear to auscultation       Cardiovascular negative cardio ROS   Rhythm:Regular Rate:Normal     Neuro/Psych Brain mets from lung cancer    GI/Hepatic Neg liver ROS, GERD  Medicated and Controlled,  Endo/Other  diabetes (glu 133), Oral Hypoglycemic Agentsobese  Renal/GU Renal InsufficiencyRenal disease (creat 1.27)     Musculoskeletal   Abdominal (+) + obese,   Peds  Hematology negative hematology ROS (+)   Anesthesia Other Findings   Reproductive/Obstetrics                           Anesthesia Physical Anesthesia Plan  ASA: III  Anesthesia Plan: General   Post-op Pain Management:    Induction: Intravenous  PONV Risk Score and Plan: 2 and Ondansetron and Dexamethasone  Airway Management Planned: Oral ETT  Additional Equipment: Arterial line  Intra-op Plan:   Post-operative Plan: Extubation in OR  Informed Consent: I have reviewed the patients History and Physical, chart, labs and discussed the procedure including the risks, benefits and alternatives for the proposed anesthesia with the patient or authorized representative who has indicated his/her understanding and acceptance.       Plan Discussed with: Surgeon and CRNA  Anesthesia Plan Comments:        Anesthesia Quick Evaluation

## 2020-04-19 NOTE — Progress Notes (Signed)
Called to get report and patient down for MRI. Day RN stated that patient had just arrived and would be better done on night shift. RN stated that Night RN will call when patient is able to come down.

## 2020-04-19 NOTE — Anesthesia Postprocedure Evaluation (Signed)
Anesthesia Post Note  Patient: Alex Cordova  Procedure(s) Performed: Right Temporal stereotactic craniotomy (Right ) APPLICATION OF CRANIAL NAVIGATION (N/A Head)     Patient location during evaluation: PACU Anesthesia Type: General Level of consciousness: sedated, oriented and patient cooperative Pain management: pain level controlled Vital Signs Assessment: post-procedure vital signs reviewed and stable Respiratory status: spontaneous breathing, nonlabored ventilation, respiratory function stable and patient connected to nasal cannula oxygen Cardiovascular status: blood pressure returned to baseline and stable Postop Assessment: no apparent nausea or vomiting Anesthetic complications: no    Last Vitals:  Vitals:   04/19/20 1835 04/19/20 1840  BP:    Pulse: (!) 46 60  Resp: 11 14  Temp:    SpO2: 96% 93%    Last Pain:  Vitals:   04/19/20 1840  TempSrc:   PainSc: 10-Worst pain ever                 Hiliana Eilts,E. Zakari Bathe

## 2020-04-20 ENCOUNTER — Inpatient Hospital Stay (HOSPITAL_COMMUNITY): Payer: No Typology Code available for payment source

## 2020-04-20 LAB — BASIC METABOLIC PANEL
Anion gap: 16 — ABNORMAL HIGH (ref 5–15)
BUN: 29 mg/dL — ABNORMAL HIGH (ref 8–23)
CO2: 20 mmol/L — ABNORMAL LOW (ref 22–32)
Calcium: 8 mg/dL — ABNORMAL LOW (ref 8.9–10.3)
Chloride: 103 mmol/L (ref 98–111)
Creatinine, Ser: 1.34 mg/dL — ABNORMAL HIGH (ref 0.61–1.24)
GFR calc Af Amer: >60 mL/min (ref >60)
GFR calc non Af Amer: 52 mL/min — ABNORMAL LOW (ref >60)
Glucose, Bld: 166 mg/dL — ABNORMAL HIGH (ref 70–99)
Potassium: 4.4 mmol/L (ref 3.5–5.1)
Sodium: 139 mmol/L (ref 135–145)

## 2020-04-20 LAB — GLUCOSE, CAPILLARY
Glucose-Capillary: 189 mg/dL — ABNORMAL HIGH (ref 70–99)
Glucose-Capillary: 179 mg/dL — ABNORMAL HIGH (ref 70–99)

## 2020-04-20 IMAGING — MR MR HEAD WO/W CM
9 of 12 series · 25 of 48 positions shown · IV contrast (10 gad)
Comparison: [DATE]

CLINICAL DATA: Postop day 1 from resection of a right temporal
metastasis. History of lung cancer.

EXAM:
MRI HEAD WITHOUT AND WITH CONTRAST
TECHNIQUE: Multiplanar, multiecho pulse sequences of the brain and surrounding
structures were obtained without and with intravenous contrast.
CONTRAST:  10mL GADAVIST GADOBUTROL 1 MMOL/ML IV SOLN

[Series 2: FLAIR · sagittal · 3.0mm · 0.47mm/px · 1 of 57 slices shown (1 of 2)]
[im 1/57]
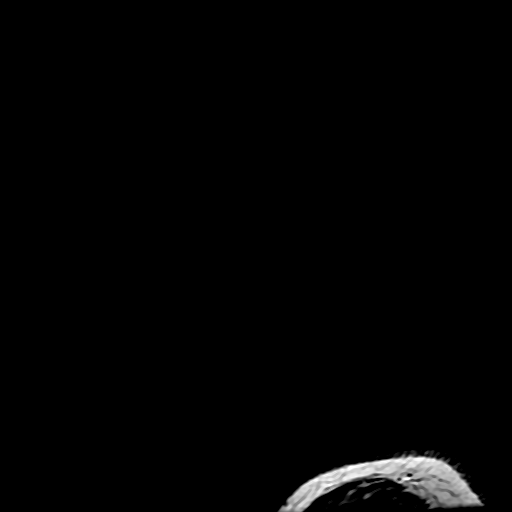

[Series 3: DWI · axial · 3.0mm · 0.94mm/px · z∈[-77,+111]mm · 6 of 127 slices shown]
[im 1/127]
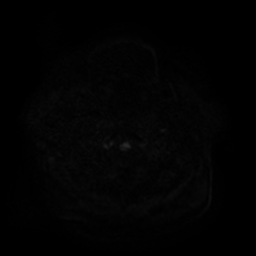
[im 26/127]
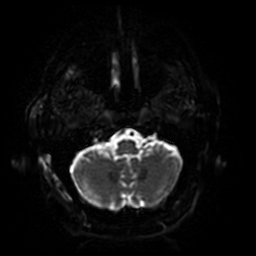
[im 51/127]
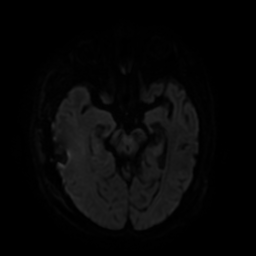
[im 76/127]
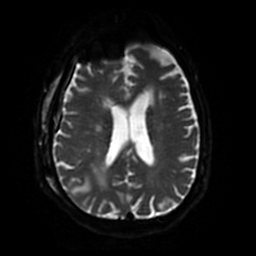
[im 101/127]
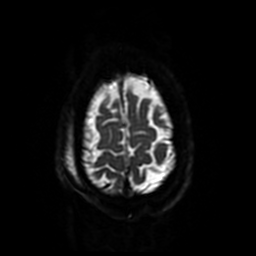
[im 127/127]
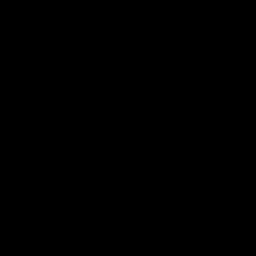

[Series 4: T2 · axial · 5.0mm · 0.47mm/px · 1 of 31 slices shown]
[im 1/31]
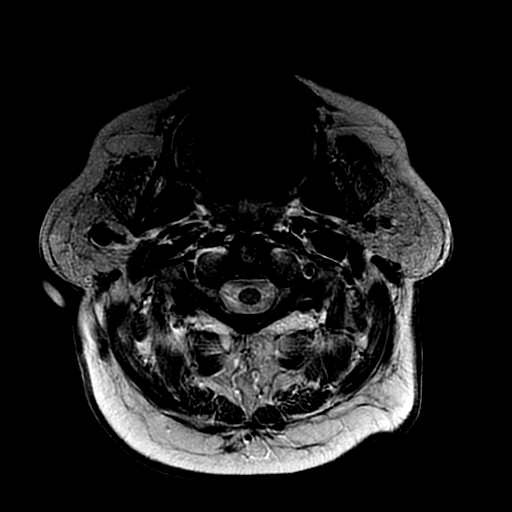

[Series 5: SWI · axial · 3.0mm · 0.47mm/px · z∈[-66,+19]mm · 3 of 116 slices shown]
[im 1/116]
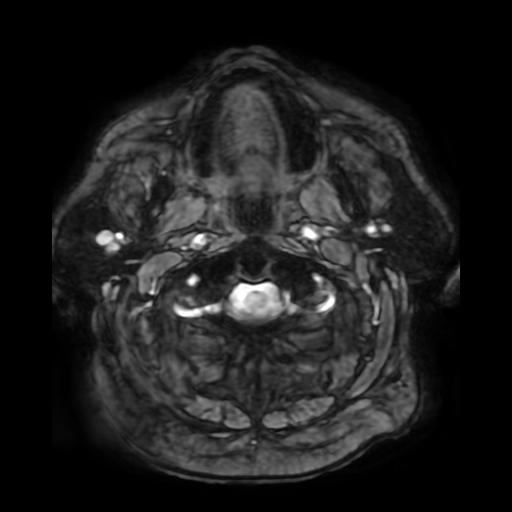
[im 29/116]
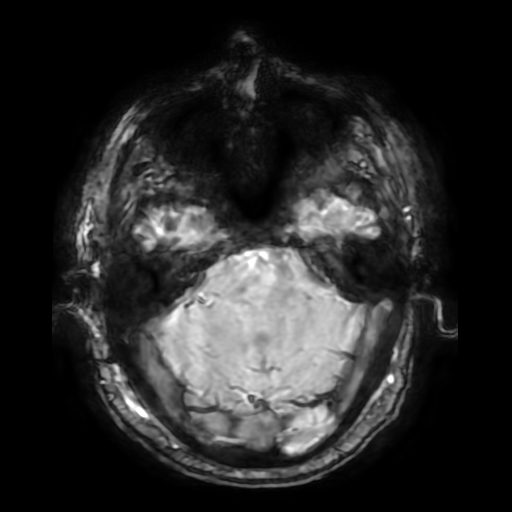
[im 58/116]
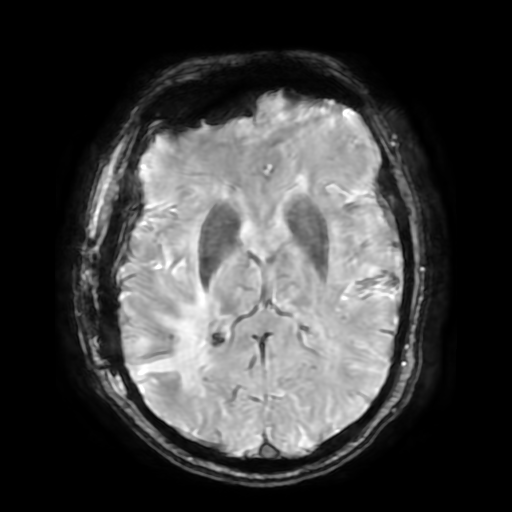

[Series 6: FLAIR · axial · 3.0mm · 0.47mm/px · z∈[-67,+110]mm · 3 of 60 slices shown (2 of 2)]
[im 1/60]
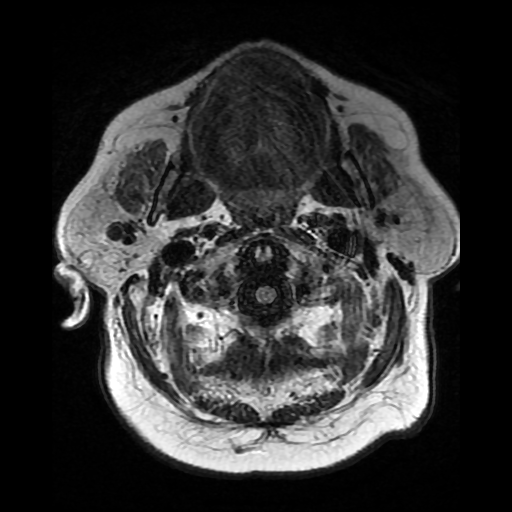
[im 30/60]
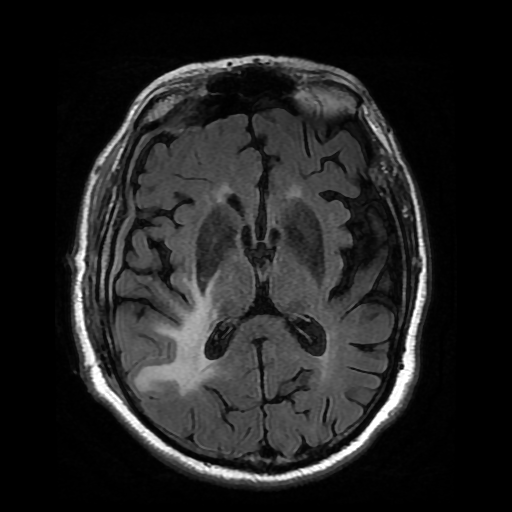
[im 60/60]
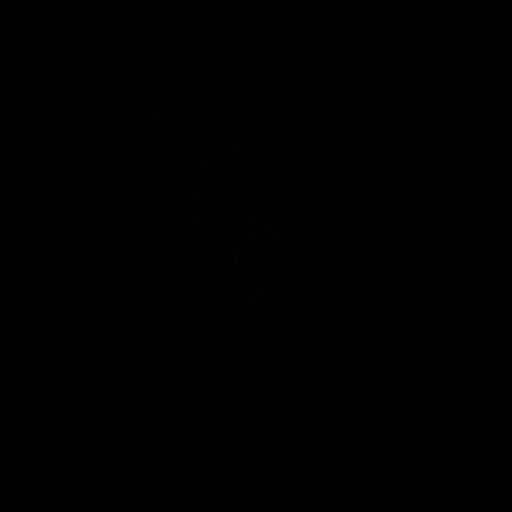

[Series 11: FLAIR post-contrast · sagittal · 3.0mm · 0.47mm/px · 2 of 57 slices shown]
[im 1/57]
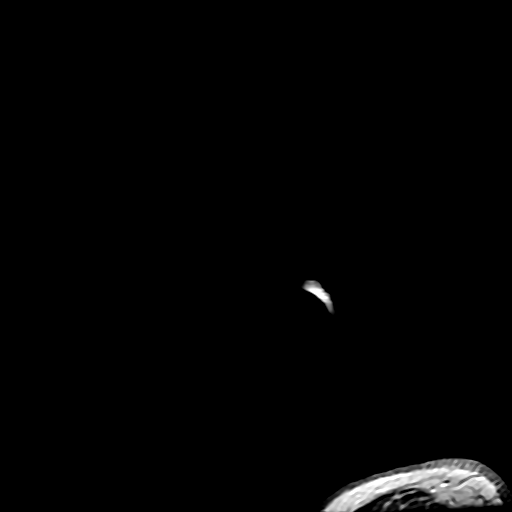
[im 57/57]
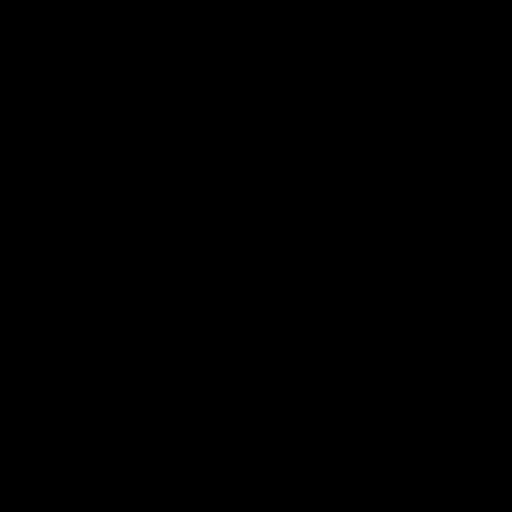

[Series 12: T2 post-contrast · coronal · 3.0mm · 0.43mm/px · 3 of 60 slices shown]
[im 1/60]
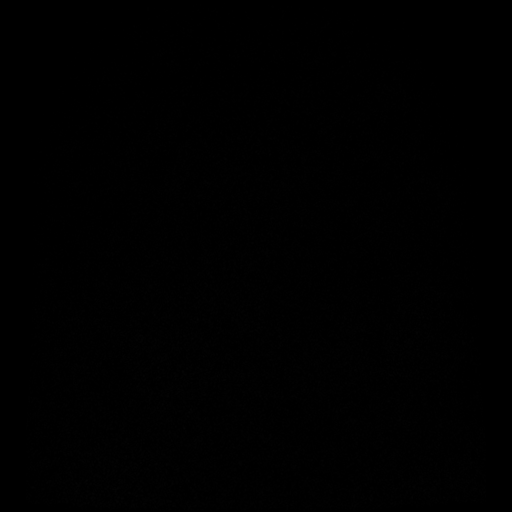
[im 30/60]
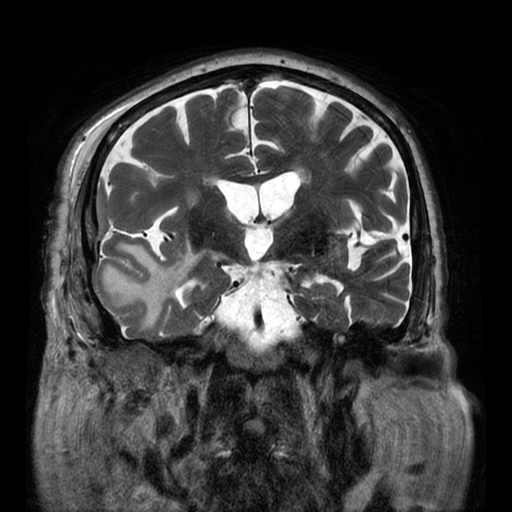
[im 60/60]
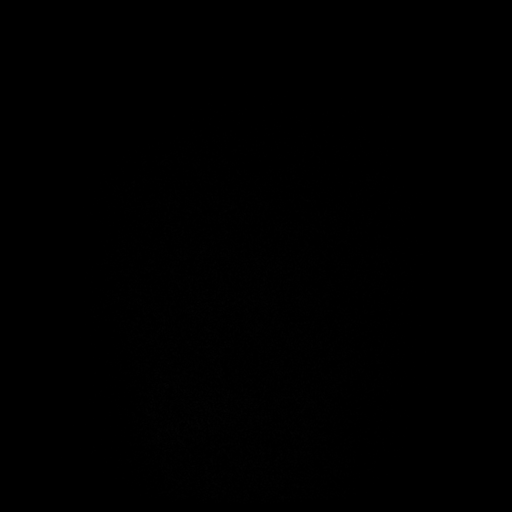

[Series 13: T1 post-contrast · coronal · 3.0mm · 0.43mm/px · 3 of 60 slices shown]
[im 1/60]
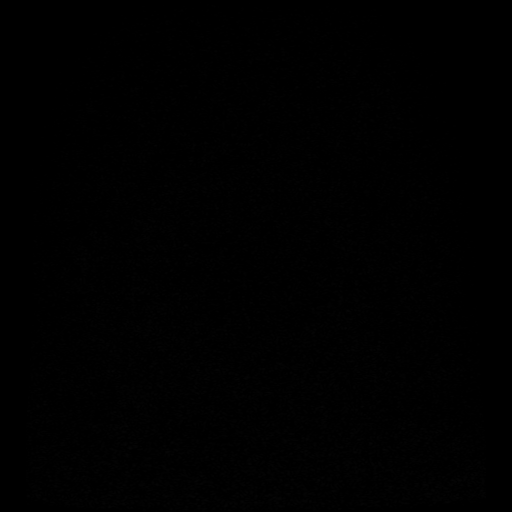
[im 30/60]
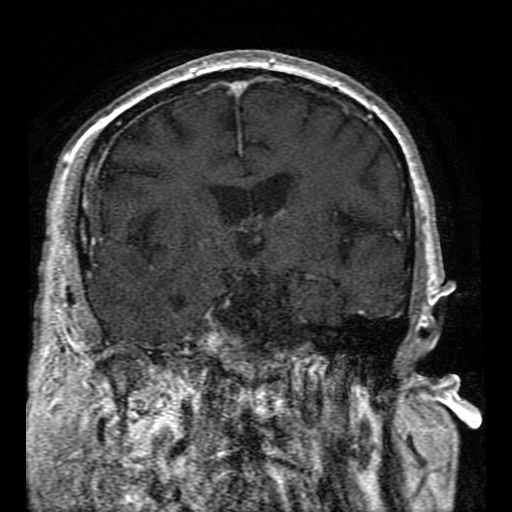
[im 60/60]
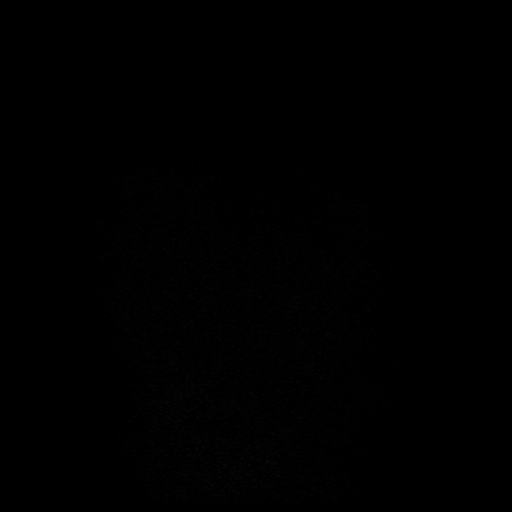

[Series 350: ADC · axial · 3.0mm · 0.94mm/px · z∈[-77,+96]mm · 3 of 59 slices shown]
[im 1/59]
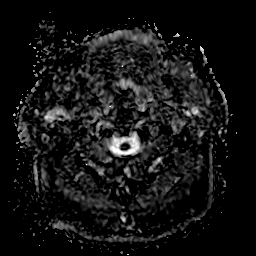
[im 30/59]
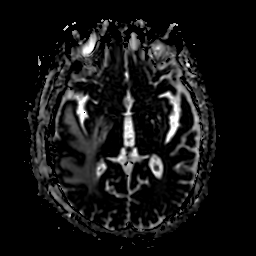
[im 59/59]
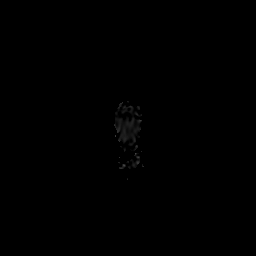

[25 of 48 positions shown; findings below may reference images not displayed]

FINDINGS: The study is mildly motion degraded.

Brain: Sequelae of interval right pterional craniotomy are
identified for resection of the right temporal mass. A small
resection cavity laterally in the right temporal lobe contains blood
products. There is intrinsic T1 hyperintensity primarily along the
posterior aspect of the cavity corresponding to blood products with
at most minimal hazy enhancement along the resection margins. No
residual masslike enhancement is present.

Extensive vasogenic edema throughout the right temporal lobe
extending into the parietal region has mildly decreased. There is
mild mass effect on the right lateral ventricle without significant
midline shift. There is small volume pneumocephalus. There is a 4 mm
subdural fluid collection subjacent to the craniotomy without mass
effect.

No new enhancing intracranial lesion is identified. There is a 2 mm
focus of restricted diffusion in the region of the right hippocampal
head. Background mild chronic small vessel ischemia is again noted
in the cerebral white matter bilaterally.

Vascular: Major intracranial vascular flow voids are preserved.

Skull and upper cervical spine: Right pterional craniotomy with
overlying scalp soft tissue swelling and small volume fluid.

Sinuses/Orbits: Unremarkable orbits. Paranasal sinuses and mastoid
air cells are clear.

Other: None.
IMPRESSION: 1. Postoperative changes from interval right temporal mass
resection. No definite residual tumor.
2. Mildly decreased vasogenic edema.
3. Possible punctate acute infarct in the right hippocampus.

## 2020-04-20 MED ORDER — CHLORHEXIDINE GLUCONATE CLOTH 2 % EX PADS: 6.0000 | MEDICATED_PAD | Freq: Every day | CUTANEOUS | Status: DC

## 2020-04-20 MED ORDER — GADOBUTROL 1 MMOL/ML IV SOLN
10.0000 mL | Freq: Once | INTRAVENOUS | Status: AC | PRN
  Administered 2020-04-20: 10 mL via INTRAVENOUS

## 2020-04-20 NOTE — Progress Notes (Signed)
Patient ID: Alex Cordova, male   DOB: 1946-05-20, 74 y.o.   MRN: [id number] Doing great.  Some soreness at the incision site.  Awake and alert and pleasant and interactive.  Nonfocal neurologic exam.  To the floor today mobilize today.

## 2020-04-21 LAB — GLUCOSE, CAPILLARY: Glucose-Capillary: 176 mg/dL — ABNORMAL HIGH (ref 70–99)

## 2020-04-21 MED ORDER — LEVETIRACETAM 500 MG PO TABS: 500.0000 mg | ORAL_TABLET | Freq: Two times a day (BID) | ORAL | 1 refills | Status: DC

## 2020-04-21 MED ORDER — METHYLPREDNISOLONE 4 MG PO TBPK
ORAL_TABLET | ORAL | 0 refills | Status: DC
Start: 2020-04-21 — End: 2020-07-23

## 2020-04-21 NOTE — Discharge Summary (Signed)
Physician Discharge Summary  Patient ID: Alex Cordova MRN: [id number] DOB/AGE: [birth year] 75 y.o.  Admit date: 04/19/2020 Discharge date: 04/21/2020  Admission Diagnoses: Right temporal brain metastasis    Discharge Diagnoses: same   Discharged Condition: good  Hospital Course: The patient was admitted on 04/19/2020 and taken to the operating room where the patient underwent right temporal craniotomy for resection of mass. The patient tolerated the procedure well and was taken to the recovery room and then to the floor in stable condition. The hospital course was routine. There were no complications. The wound remained clean dry and intact. Pt had appropriate head soreness.  The patient remained afebrile with stable vital signs, and tolerated a regular diet. The patient continued to increase activities, and pain was well controlled with oral pain medications.   Consults: None  Significant Diagnostic Studies:  Results for orders placed or performed during the hospital encounter of 04/19/20  Glucose, capillary  Result Value Ref Range   Glucose-Capillary 133 (H) 70 - 99 mg/dL  Glucose, capillary  Result Value Ref Range   Glucose-Capillary 101 (H) 70 - 99 mg/dL   Comment 1 Notify RN    Comment 2 Document in Chart   Basic metabolic panel  Result Value Ref Range   Sodium 135 135 - 145 mmol/L   Potassium 4.2 3.5 - 5.1 mmol/L   Chloride 103 98 - 111 mmol/L   CO2 21 (L) 22 - 32 mmol/L   Glucose, Bld 168 (H) 70 - 99 mg/dL   BUN 26 (H) 8 - 23 mg/dL   Creatinine, Ser 1.28 (H) 0.61 - 1.24 mg/dL   Calcium 7.9 (L) 8.9 - 10.3 mg/dL   GFR calc non Af Amer 55 (L) >60 mL/min   GFR calc Af Amer >60 >60 mL/min   Anion gap 11 5 - 15  CBC  Result Value Ref Range   WBC 13.9 (H) 4.0 - 10.5 K/uL   RBC 5.14 4.22 - 5.81 MIL/uL   Hemoglobin 14.8 13.0 - 17.0 g/dL   HCT 44.8 39.0 - 52.0 %   MCV 87.2 80.0 - 100.0 fL   MCH 28.8 26.0 - 34.0 pg   MCHC 33.0 30.0 - 36.0 g/dL   RDW 13.8 11.5 -  15.5 %   Platelets 204 150 - 400 K/uL   nRBC 0.0 0.0 - 0.2 %  Basic metabolic panel  Result Value Ref Range   Sodium 139 135 - 145 mmol/L   Potassium 4.4 3.5 - 5.1 mmol/L   Chloride 103 98 - 111 mmol/L   CO2 20 (L) 22 - 32 mmol/L   Glucose, Bld 166 (H) 70 - 99 mg/dL   BUN 29 (H) 8 - 23 mg/dL   Creatinine, Ser 1.34 (H) 0.61 - 1.24 mg/dL   Calcium 8.0 (L) 8.9 - 10.3 mg/dL   GFR calc non Af Amer 52 (L) >60 mL/min   GFR calc Af Amer >60 >60 mL/min   Anion gap 16 (H) 5 - 15  Glucose, capillary  Result Value Ref Range   Glucose-Capillary 189 (H) 70 - 99 mg/dL  Glucose, capillary  Result Value Ref Range   Glucose-Capillary 179 (H) 70 - 99 mg/dL  Glucose, capillary  Result Value Ref Range   Glucose-Capillary 176 (H) 70 - 99 mg/dL  Type and screen  Result Value Ref Range   ABO/RH(D) O POS    Antibody Screen NEG    Sample Expiration      04/22/2020,2359 Performed at [facility]  Lab, [address], [place], [place] [address]   ABO/Rh  Result Value Ref Range   ABO/RH(D)      O POS Performed at [facility] [address] [place], [place] [address]     MR BRAIN W WO CONTRAST  Result Date: 04/20/2020 CLINICAL DATA:  Postop day 1 from resection of a right temporal metastasis. History of lung cancer. EXAM: MRI HEAD WITHOUT AND WITH CONTRAST TECHNIQUE: Multiplanar, multiecho pulse sequences of the brain and surrounding structures were obtained without and with intravenous contrast. CONTRAST:  102mL GADAVIST GADOBUTROL 1 MMOL/ML IV SOLN COMPARISON:  04/11/2020 FINDINGS: The study is mildly motion degraded. Brain: Sequelae of interval right pterional craniotomy are identified for resection of the right temporal mass. A small resection cavity laterally in the right temporal lobe contains blood products. There is intrinsic T1 hyperintensity primarily along the posterior aspect of the cavity corresponding to blood products with at most minimal hazy enhancement along the resection  margins. No residual masslike enhancement is present. Extensive vasogenic edema throughout the right temporal lobe extending into the parietal region has mildly decreased. There is mild mass effect on the right lateral ventricle without significant midline shift. There is small volume pneumocephalus. There is a 4 mm subdural fluid collection subjacent to the craniotomy without mass effect. No new enhancing intracranial lesion is identified. There is a 2 mm focus of restricted diffusion in the region of the right hippocampal head. Background mild chronic small vessel ischemia is again noted in the cerebral white matter bilaterally. Vascular: Major intracranial vascular flow voids are preserved. Skull and upper cervical spine: Right pterional craniotomy with overlying scalp soft tissue swelling and small volume fluid. Sinuses/Orbits: Unremarkable orbits. Paranasal sinuses and mastoid air cells are clear. Other: None. IMPRESSION: 1. Postoperative changes from interval right temporal mass resection. No definite residual tumor. 2. Mildly decreased vasogenic edema. 3. Possible punctate acute infarct in the right hippocampus. Electronically Signed   By: Logan Bores M.D.   On: 04/20/2020 19:33   MR BRAIN W WO CONTRAST  Result Date: 04/11/2020 CLINICAL DATA:  Lung cancer with brain metastasis EXAM: MRI HEAD WITHOUT AND WITH CONTRAST TECHNIQUE: Multiplanar, multiecho pulse sequences of the brain and surrounding structures were obtained without and with intravenous contrast. CONTRAST:  44mL GADAVIST GADOBUTROL 1 MMOL/ML IV SOLN COMPARISON:  04/10/2020 FINDINGS: Motion artifact is present. Brain: Heterogeneously enhancing lesion of the peripheral right temporal lobe probably within the middle temporal gyrus is again identified. This now measures 1.8 x 1.7 x 2.1 cm with decrease in size of the more homogeneously enhancing portion at the deep margin. Extensive surrounding edema is again noted similar to the prior study. There  is unchanged partial effacement of the right lateral ventricle, slight medialization of the right uncus, and trace leftward midline shift. No new or acute findings. Vascular: Major vessel flow voids at the skull base are preserved. Skull and upper cervical spine: Normal marrow signal is preserved. Sinuses/Orbits: Paranasal sinuses are aerated. Orbits are unremarkable. Other: Sella is unremarkable.  Mastoid air cells are clear. IMPRESSION: Right temporal lobe mass measures slightly smaller, which may be related to interval steroid administration. Extent of surrounding edema is not substantially changed and there is stable mild regional mass effect. Electronically Signed   By: Macy Mis M.D.   On: 04/11/2020 19:06    Antibiotics:  Anti-infectives (From admission, onward)   Start     Dose/Rate Route Frequency Ordered Stop   04/20/20 0600  ceFAZolin (ANCEF)  IVPB 2g/100 mL premix     2 g 200 mL/hr over 30 Minutes Intravenous On call to O.R. 04/19/20 1256 04/19/20 1430   04/19/20 2300  ceFAZolin (ANCEF) IVPB 2g/100 mL premix     2 g 200 mL/hr over 30 Minutes Intravenous Every 8 hours 04/19/20 1831 04/20/20 0639   04/19/20 1427  bacitracin 50,000 Units in sodium chloride 0.9 % 500 mL irrigation  Status:  Discontinued       As needed 04/19/20 1427 04/19/20 1710      Discharge Exam: Blood pressure 136/87, pulse (!) 48, temperature 98.9 F (37.2 C), temperature source Oral, resp. rate 13, height 5' 10.5" (1.791 m), weight 101.7 kg, SpO2 94 %. Neurologic: Grossly normal Ambulating and voiding well, incision cdi.   Discharge Medications:   Allergies as of 04/21/2020      Reactions   Sulfamethoxazole Nausea Only   Childhood allergy      Medication List    TAKE these medications   dexamethasone 4 MG tablet Commonly known as: DECADRON Take 1 tablet (4 mg total) by mouth 3 (three) times daily.   levETIRAcetam 500 MG tablet Commonly known as: KEPPRA Take 1 tablet (500 mg total) by mouth 2  (two) times daily.   METAMUCIL PO Take 1 Scoop by mouth daily.   metFORMIN 500 MG tablet Commonly known as: GLUCOPHAGE Take 500 mg by mouth daily.   methylPREDNISolone 4 MG Tbpk tablet Commonly known as: MEDROL DOSEPAK Take as directed on dose pack   pantoprazole 40 MG tablet Commonly known as: PROTONIX Take 1 tablet (40 mg total) by mouth daily.       Disposition: home   Final Dx: right craniotomy for resection of temporal mass.   Discharge Instructions    Call MD for:  difficulty breathing, headache or visual disturbances   Complete by: As directed    Call MD for:  hives   Complete by: As directed    Call MD for:  persistant nausea and vomiting   Complete by: As directed    Call MD for:  redness, tenderness, or signs of infection (pain, swelling, redness, odor or green/yellow discharge around incision site)   Complete by: As directed    Call MD for:  severe uncontrolled pain   Complete by: As directed    Call MD for:  temperature >100.4   Complete by: As directed    Diet - low sodium heart healthy   Complete by: As directed    Driving Restrictions   Complete by: As directed    No driving for 2 weeks, no riding in the car for 1 week   Increase activity slowly   Complete by: As directed          Signed: [name] Renaldo Gornick 04/21/2020, 9:09 AM

## 2020-04-21 NOTE — Discharge Summary (Signed)
Subjective: Patient reports no headaches, doing really well this morning.   Objective: Vital signs in last 24 hours: Temp:  [97.7 F (36.5 C)-98.9 F (37.2 C)] 98.9 F (37.2 C) (05/16 0415) Pulse Rate:  [48-79] 48 (05/16 0415) Resp:  [12-19] 13 (05/16 0415) BP: (117-142)/(61-87) 136/87 (05/16 0415) SpO2:  [91 %-95 %] 94 % (05/16 0415)  Intake/Output from previous day: 05/15 0701 - 05/16 0700 In: 1658.2 [P.O.:540; I.V.:918.2; IV Piggyback:200] Out: 4680 [Urine:4125] Intake/Output this shift: No intake/output data recorded.  Neurologic: Grossly normal  Lab Results: Lab Results  Component Value Date   WBC 13.9 (H) 04/19/2020   HGB 14.8 04/19/2020   HCT 44.8 04/19/2020   MCV 87.2 04/19/2020   PLT 204 04/19/2020   No results found for: INR, PROTIME BMET Lab Results  Component Value Date   NA 139 04/20/2020   K 4.4 04/20/2020   CL 103 04/20/2020   CO2 20 (L) 04/20/2020   GLUCOSE 166 (H) 04/20/2020   BUN 29 (H) 04/20/2020   CREATININE 1.34 (H) 04/20/2020   CALCIUM 8.0 (L) 04/20/2020    Studies/Results: MR BRAIN W WO CONTRAST  Result Date: 04/20/2020 CLINICAL DATA:  Postop day 1 from resection of a right temporal metastasis. History of lung cancer. EXAM: MRI HEAD WITHOUT AND WITH CONTRAST TECHNIQUE: Multiplanar, multiecho pulse sequences of the brain and surrounding structures were obtained without and with intravenous contrast. CONTRAST:  58mL GADAVIST GADOBUTROL 1 MMOL/ML IV SOLN COMPARISON:  04/11/2020 FINDINGS: The study is mildly motion degraded. Brain: Sequelae of interval right pterional craniotomy are identified for resection of the right temporal mass. A small resection cavity laterally in the right temporal lobe contains blood products. There is intrinsic T1 hyperintensity primarily along the posterior aspect of the cavity corresponding to blood products with at most minimal hazy enhancement along the resection margins. No residual masslike enhancement is present.  Extensive vasogenic edema throughout the right temporal lobe extending into the parietal region has mildly decreased. There is mild mass effect on the right lateral ventricle without significant midline shift. There is small volume pneumocephalus. There is a 4 mm subdural fluid collection subjacent to the craniotomy without mass effect. No new enhancing intracranial lesion is identified. There is a 2 mm focus of restricted diffusion in the region of the right hippocampal head. Background mild chronic small vessel ischemia is again noted in the cerebral white matter bilaterally. Vascular: Major intracranial vascular flow voids are preserved. Skull and upper cervical spine: Right pterional craniotomy with overlying scalp soft tissue swelling and small volume fluid. Sinuses/Orbits: Unremarkable orbits. Paranasal sinuses and mastoid air cells are clear. Other: None. IMPRESSION: 1. Postoperative changes from interval right temporal mass resection. No definite residual tumor. 2. Mildly decreased vasogenic edema. 3. Possible punctate acute infarct in the right hippocampus. Electronically Signed   By: Logan Bores M.D.   On: 04/20/2020 19:33    Assessment/Plan: 74 year old male 2 days postop from crani for tumor resection. Will need to actually ambulate today since he has not been out of bed since surgery and foley was just removed this morning. He has not urinated since. Will write discharge orders but needs to ambulate and void before going home.    LOS: 2 days    [name] Sharleen Szczesny 04/21/2020, 9:02 AM

## 2020-04-21 NOTE — Progress Notes (Signed)
Patient mews scored yellow for low heart rate and low respirations. Patient HR 48- Sinus bradycardia on the monitor. Pulse even and regular. This is not an acute change for this patient. Awakens easily. No distress at this time. Sats 93-95%.

## 2020-04-21 NOTE — Progress Notes (Signed)
Pt with discharge orders. Pt ambulated around unit with no complications. IV removed. Pt has voided X2 since catheter removal with <50 ml post-void residual. Dressing changed on incision site. Discharge paperwork reviewed with pt and spouse and all questions answered. Prescriptions electronically faxed to pharmacy. Pt escorted out via wheelchair to private vehicle.

## 2020-04-21 NOTE — Plan of Care (Signed)
  Problem: Clinical Measurements: Goal: Usual level of consciousness will be regained or maintained. Outcome: Progressing   Problem: Skin Integrity: Goal: Demonstration of wound healing without infection will improve Outcome: Progressing

## 2020-04-23 ENCOUNTER — Encounter: Payer: Self-pay | Admitting: *Deleted

## 2020-04-23 LAB — SURGICAL PATHOLOGY

## 2020-05-22 ENCOUNTER — Ambulatory Visit
Admission: RE | Admit: 2020-05-22 | Discharge: 2020-05-22 | Disposition: A | Discharge status: Home / Self Care | Payer: No Typology Code available for payment source | Source: Ambulatory Visit | Attending: Radiation Oncology | Admitting: Radiation Oncology

## 2020-05-22 DIAGNOSIS — C3412 Malignant neoplasm of upper lobe, left bronchus or lung: Secondary | ICD-10-CM

## 2020-05-22 DIAGNOSIS — C7931 Secondary malignant neoplasm of brain: Secondary | ICD-10-CM

## 2020-05-22 NOTE — Progress Notes (Signed)
  Radiation Oncology         [phone]) [phone] ________________________________  Name: Alex Cordova MRN: [id number]  Date of Service: 05/22/2020  DOB: July 10, 1946  Post Treatment Telephone Note  Diagnosis:  Stage IV, NSCLC, squamous cell carcinoma of the left lung originally found to have metastatic disease to the acromion in 2019, without other visceral metastases, now presenting with new solitary brain metastasis in the right temporal lobe.  Interval Since Last Radiation:  5 weeks   04/18/20 Preoperative SRS Treatment: PTV1 Right Temporal 22 mm target was treated to 15 Gy in 1 fraction  Narrative:  The patient was contacted today for routine follow-up. During treatment he did very well with radiotherapy and also quite well with his craniotomy and resection of the tumor on 04/19/20. Final pathology was consistent with metastatic adenocarcinoma consistent with lung primary.   Impression/Plan: 1. Stage IV, NSCLC, squamous cell carcinoma of the left lung originally found to have metastatic disease to the acromion in 2019, without other visceral metastases, now presenting with new solitary brain metastasis in the right temporal lobe. I had to leave a voicemail today but in it, I discussed that we would be happy to continue to follow him as needed and his case will be reviewed regularly in brain oncology conference, but he will also continue to follow up with Dr. Mickeal Skinner in neuro oncology at these imaging intervals when his case is discussed, and with Dr. Bobby Rumpf in medical oncology.     Carola Rhine, PAC

## 2020-06-07 ENCOUNTER — Other Ambulatory Visit: Payer: Self-pay | Admitting: Radiation Therapy

## 2020-06-07 DIAGNOSIS — C7931 Secondary malignant neoplasm of brain: Secondary | ICD-10-CM

## 2020-07-13 ENCOUNTER — Ambulatory Visit (HOSPITAL_COMMUNITY): Payer: No Typology Code available for payment source

## 2020-07-15 NOTE — Progress Notes (Signed)
  Radiation Oncology         [phone]) [phone] ________________________________  Name: Alex Cordova MRN: [id number]  Date: 04/18/2020  DOB: 03-Dec-1946  End of Treatment Note  Diagnosis:   Brain metastasis     Indication for treatment:  palliative       Radiation treatment dates:   04/18/20  Site/dose:    ExacTrac, 4 vmat beams max dose=132.8% PTV1 Rt Temporal 48mm  Narrative: The patient tolerated radiation treatment well.   There were no signs of acute toxicity after treatment.  Plan: The patient has completed radiation treatment. The patient will return to radiation oncology clinic for routine followup in one month. I advised the patient to call or return sooner if they have any questions or concerns related to their recovery or treatment. ________________________________  ------------------------------------------------  Jodelle Gross, MD, PhD

## 2020-07-19 ENCOUNTER — Ambulatory Visit (HOSPITAL_COMMUNITY): Payer: No Typology Code available for payment source

## 2020-07-19 ENCOUNTER — Ambulatory Visit (HOSPITAL_COMMUNITY)
Admission: RE | Admit: 2020-07-19 | Discharge: 2020-07-19 | Disposition: A | Discharge status: Home / Self Care | Payer: No Typology Code available for payment source | Source: Ambulatory Visit | Attending: Radiation Oncology | Admitting: Radiation Oncology

## 2020-07-19 ENCOUNTER — Other Ambulatory Visit: Payer: Self-pay

## 2020-07-19 DIAGNOSIS — C7931 Secondary malignant neoplasm of brain: Secondary | ICD-10-CM | POA: Diagnosis not present

## 2020-07-19 DIAGNOSIS — C7949 Secondary malignant neoplasm of other parts of nervous system: Secondary | ICD-10-CM | POA: Insufficient documentation

## 2020-07-19 IMAGING — MR MR HEAD WO/W CM
9 of 12 series · 24 of 48 positions shown · IV contrast (gadavist)
Comparison: MRI head [DATE], [DATE]

CLINICAL DATA: Metastatic lung cancer. Right temporal lobe lesion
follow-up treatment response.

EXAM:
MRI HEAD WITHOUT AND WITH CONTRAST
TECHNIQUE: Multiplanar, multiecho pulse sequences of the brain and surrounding
structures were obtained without and with intravenous contrast.
CONTRAST:  10mL GADAVIST GADOBUTROL 1 MMOL/ML IV SOLN

[Series 2: FLAIR · sagittal · 3.0mm · 0.47mm/px · 2 of 47 slices shown (1 of 2)]
[im 1/47]
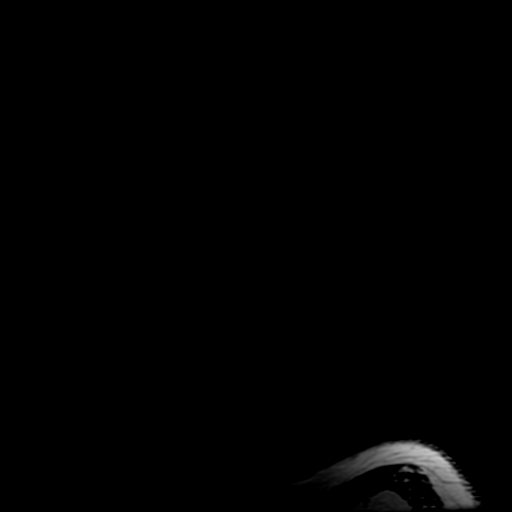
[im 47/47]
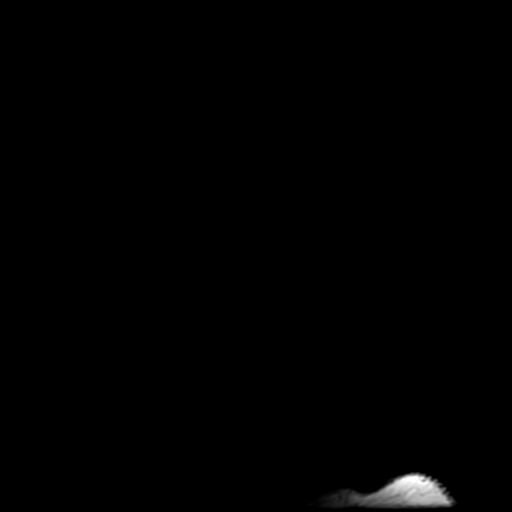

[Series 3: DWI · axial · 3.0mm · 0.94mm/px · z∈[-53,+129]mm · 5 of 124 slices shown]
[im 1/124]
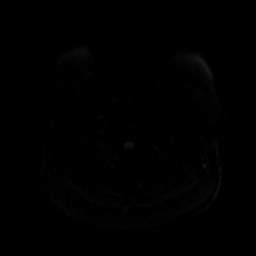
[im 31/124]
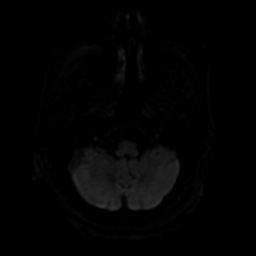
[im 62/124]
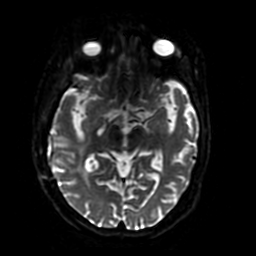
[im 93/124]
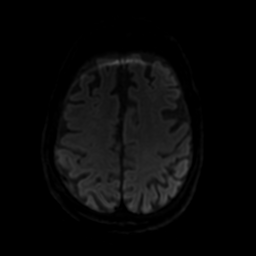
[im 124/124]
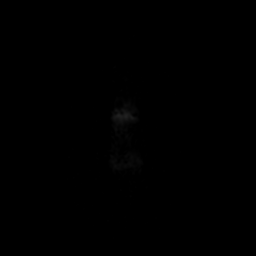

[Series 4: T2 · axial · 5.0mm · 0.47mm/px · 1 of 34 slices shown]
[im 1/34]
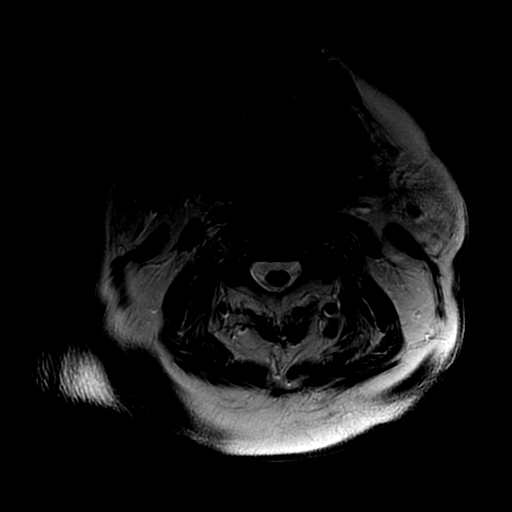

[Series 5: FLAIR · axial · 3.0mm · 0.47mm/px · z∈[-53,+139]mm · 3 of 65 slices shown (2 of 2)]
[im 1/65]
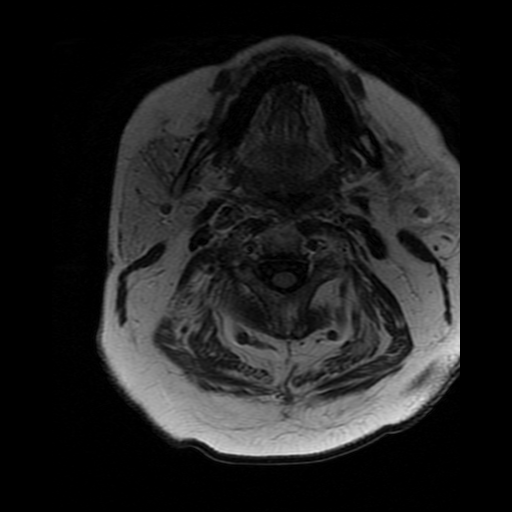
[im 33/65]
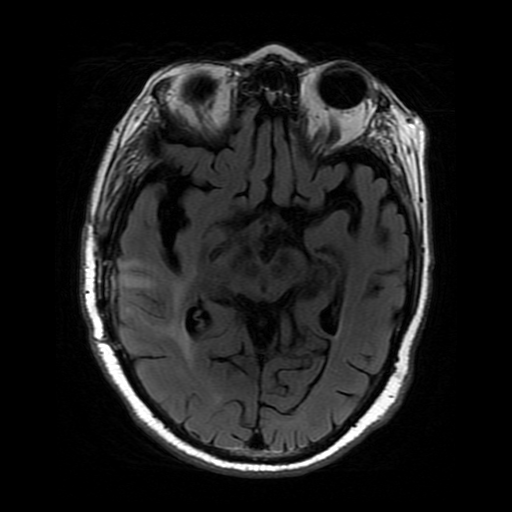
[im 65/65]
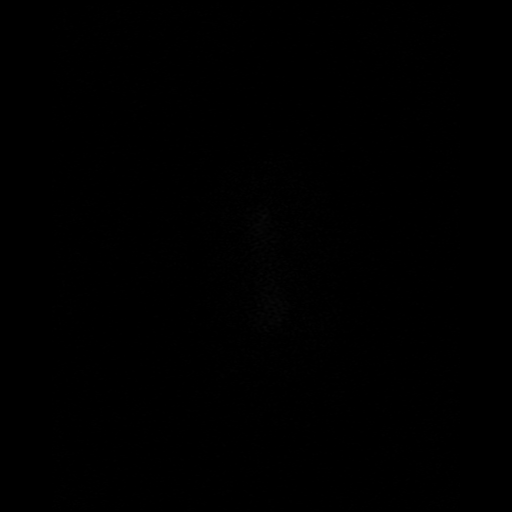

[Series 6: SWI · axial · 3.0mm · 0.47mm/px · z∈[-41,+1]mm · 2 of 116 slices shown]
[im 1/116]
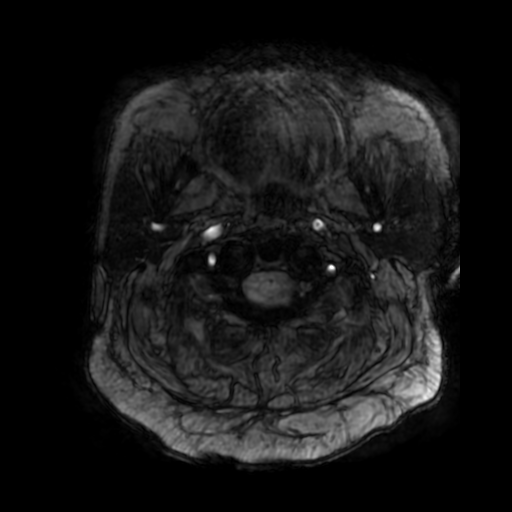
[im 29/116]
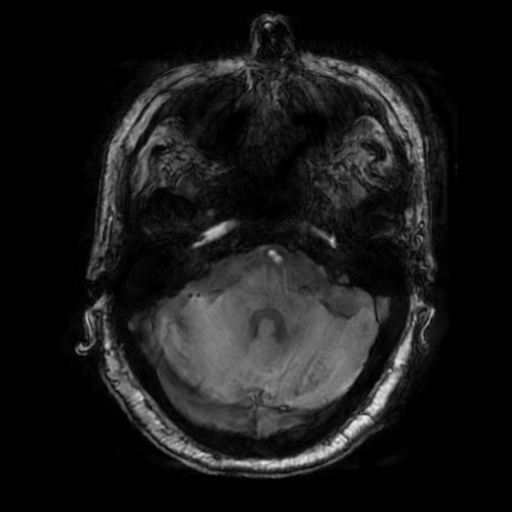

[Series 8: T2 post-contrast · coronal · 3.0mm · 0.39mm/px · 3 of 60 slices shown]
[im 1/60]
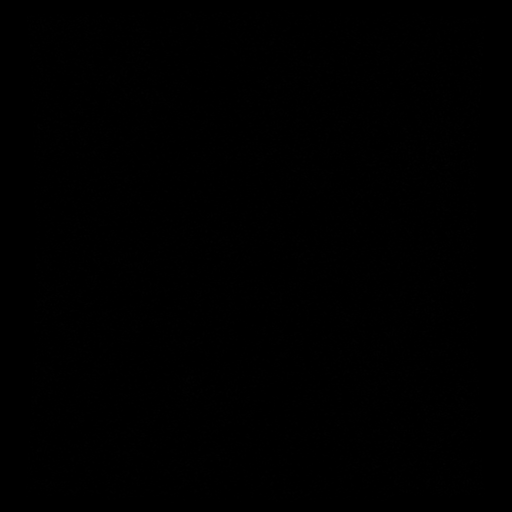
[im 30/60]
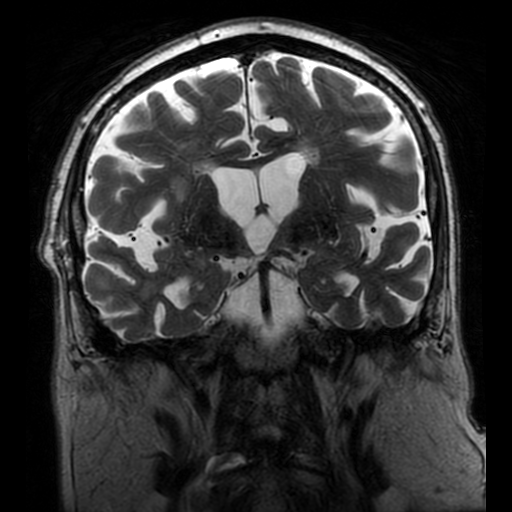
[im 60/60]
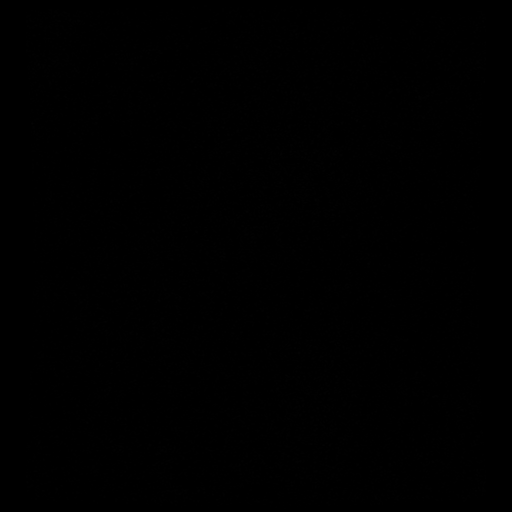

[Series 10: T1 post-contrast · coronal · 3.0mm · 0.39mm/px · 3 of 60 slices shown]
[im 1/60]
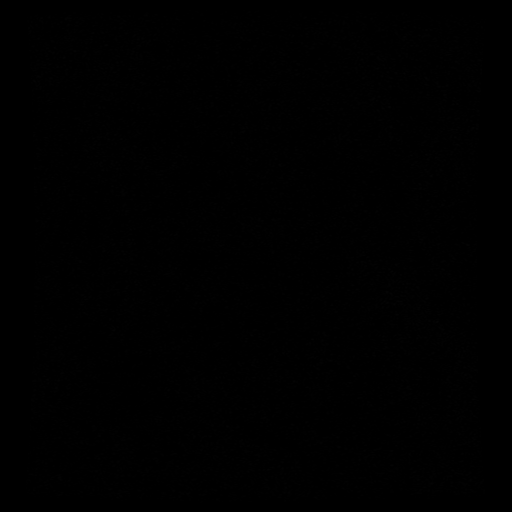
[im 30/60]
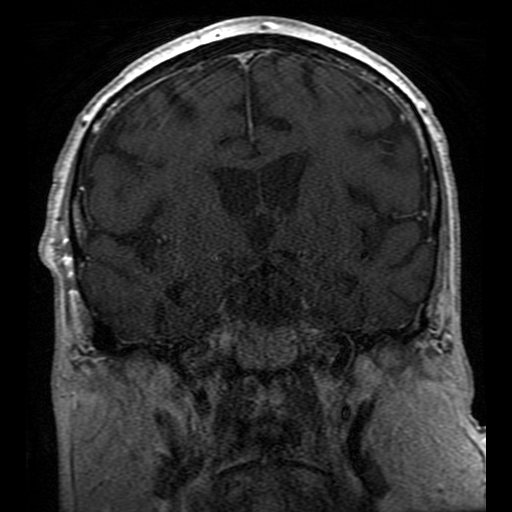
[im 60/60]
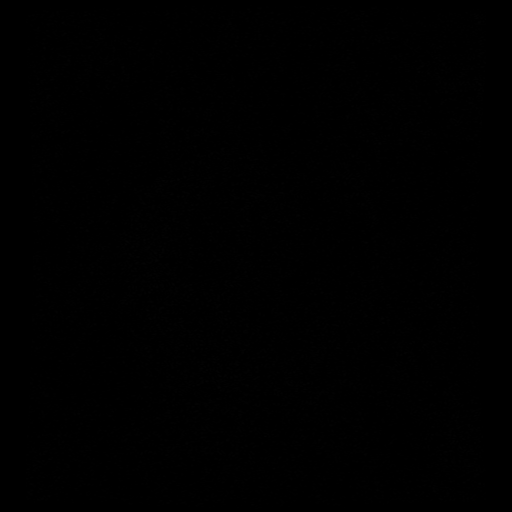

[Series 11: FLAIR post-contrast · sagittal · 3.0mm · 0.47mm/px · 2 of 47 slices shown]
[im 1/47]
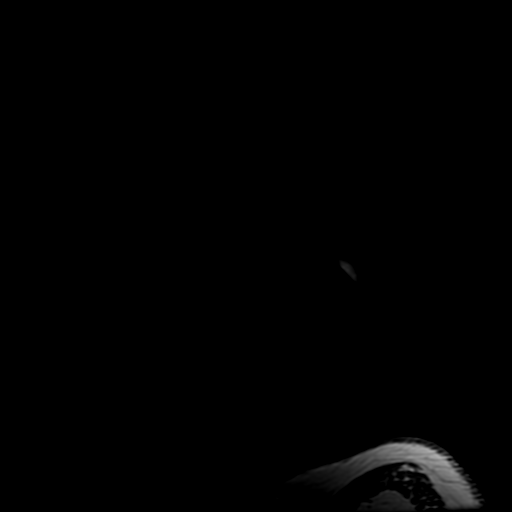
[im 47/47]
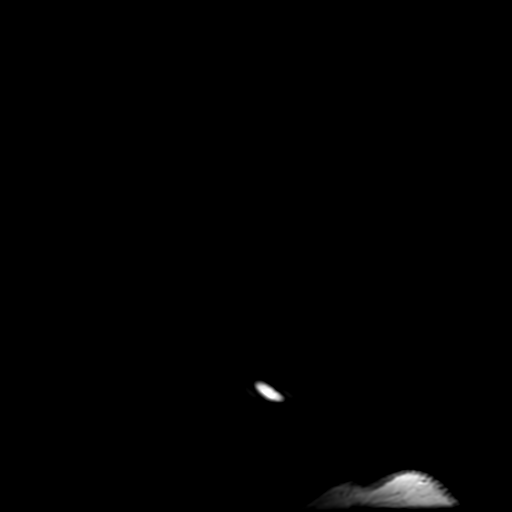

[Series 350: ADC · axial · 3.0mm · 0.94mm/px · z∈[-53,+129]mm · 3 of 62 slices shown]
[im 1/62]
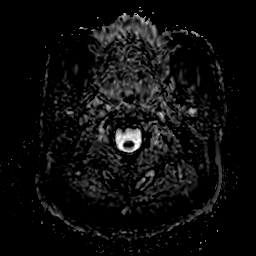
[im 31/62]
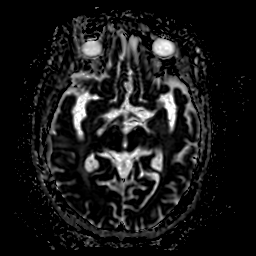
[im 62/62]
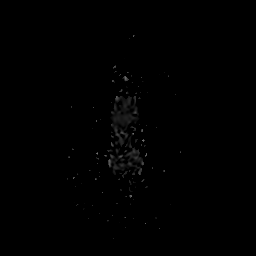

[24 of 48 positions shown; findings below may reference images not displayed]

FINDINGS: Brain: Right temporal craniotomy for resection of tumor. Right
posterior temporal surgical resection cavity. There is progressive
enhancement of the dura and wall of the resection cavity compared
with the prior MRI however there is no significant nodularity or
suggestion of recurrent tumor. There is significant improvement in
edema in the right temporal lobe.

New enhancing lesion in the right parietal subcortical white matter.
4 mm rim enhancing lesion compatible metastatic disease. This is not
seen on the preop or postoperative MRI. No significant edema. No
associated hemorrhage. No other metastatic deposits identified

Generalized atrophy. Negative for acute infarct. Chronic
microvascular ischemic changes in the white matter. Ventricle size
normal.

Vascular: Normal arterial flow voids

Skull and upper cervical spine: Right temporal craniotomy. No
metastatic lesions in the calvarium.

Sinuses/Orbits: Paranasal sinuses clear.  Negative orbit

Other: None
IMPRESSION: Right temporal lobe resection cavity without recurrent tumor.
Decreased edema. There is increased enhancement of the dura and wall
of the resection cavity which is most likely related to surgery and
radiation, and not tumor.

New enhancing lesion in the right parietal subcortical white matter
4 mm, consistent with new metastatic disease not seen on the prior
studies.

## 2020-07-19 MED ORDER — GADOBUTROL 1 MMOL/ML IV SOLN
10.0000 mL | Freq: Once | INTRAVENOUS | Status: AC | PRN
  Administered 2020-07-19: 10 mL via INTRAVENOUS

## 2020-07-22 ENCOUNTER — Other Ambulatory Visit: Payer: Self-pay | Admitting: Radiation Therapy

## 2020-07-22 DIAGNOSIS — C7949 Secondary malignant neoplasm of other parts of nervous system: Secondary | ICD-10-CM

## 2020-07-22 DIAGNOSIS — C7931 Secondary malignant neoplasm of brain: Secondary | ICD-10-CM

## 2020-07-23 ENCOUNTER — Encounter: Payer: Self-pay | Admitting: Internal Medicine

## 2020-07-23 ENCOUNTER — Other Ambulatory Visit: Payer: Self-pay | Admitting: Radiation Oncology

## 2020-07-23 ENCOUNTER — Inpatient Hospital Stay (HOSPITAL_BASED_OUTPATIENT_CLINIC_OR_DEPARTMENT_OTHER): Payer: No Typology Code available for payment source | Admitting: Internal Medicine

## 2020-07-23 ENCOUNTER — Ambulatory Visit
Admission: RE | Admit: 2020-07-23 | Discharge: 2020-07-23 | Disposition: A | Discharge status: Home / Self Care | Payer: No Typology Code available for payment source | Source: Ambulatory Visit | Attending: Internal Medicine | Admitting: Internal Medicine

## 2020-07-23 ENCOUNTER — Other Ambulatory Visit: Payer: Self-pay

## 2020-07-23 VITALS — BP 154/85 | HR 61 | Temp 97.6°F | Resp 18 | Ht 70.5 in | Wt 223.6 lb

## 2020-07-23 DIAGNOSIS — Z79899 Other long term (current) drug therapy: Secondary | ICD-10-CM | POA: Insufficient documentation

## 2020-07-23 DIAGNOSIS — C7931 Secondary malignant neoplasm of brain: Secondary | ICD-10-CM | POA: Diagnosis not present

## 2020-07-23 DIAGNOSIS — C3490 Malignant neoplasm of unspecified part of unspecified bronchus or lung: Secondary | ICD-10-CM | POA: Insufficient documentation

## 2020-07-23 DIAGNOSIS — E119 Type 2 diabetes mellitus without complications: Secondary | ICD-10-CM | POA: Insufficient documentation

## 2020-07-23 DIAGNOSIS — Z510 Encounter for antineoplastic radiation therapy: Secondary | ICD-10-CM | POA: Diagnosis not present

## 2020-07-23 DIAGNOSIS — C7949 Secondary malignant neoplasm of other parts of nervous system: Secondary | ICD-10-CM | POA: Insufficient documentation

## 2020-07-23 DIAGNOSIS — Z87891 Personal history of nicotine dependence: Secondary | ICD-10-CM | POA: Insufficient documentation

## 2020-07-23 DIAGNOSIS — K219 Gastro-esophageal reflux disease without esophagitis: Secondary | ICD-10-CM | POA: Insufficient documentation

## 2020-07-23 DIAGNOSIS — Z7984 Long term (current) use of oral hypoglycemic drugs: Secondary | ICD-10-CM | POA: Insufficient documentation

## 2020-07-23 DIAGNOSIS — E669 Obesity, unspecified: Secondary | ICD-10-CM | POA: Insufficient documentation

## 2020-07-23 DIAGNOSIS — E785 Hyperlipidemia, unspecified: Secondary | ICD-10-CM | POA: Insufficient documentation

## 2020-07-23 LAB — BUN & CREATININE (CHCC)
BUN: 9 mg/dL (ref 8–23)
Creatinine: 0.99 mg/dL (ref 0.61–1.24)
GFR, Est AFR Am: >60 mL/min (ref >60)
GFR, Estimated: >60 mL/min (ref >60)

## 2020-07-23 MED ORDER — LORAZEPAM 0.5 MG PO TABS: ORAL_TABLET | ORAL | 0 refills | Status: DC

## 2020-07-23 NOTE — Progress Notes (Signed)
Has armband been applied?  Yes  Does patient have an allergy to IV contrast dye?: No   Has patient ever received premedication for IV contrast dye?: n/a  Does patient take metformin?: Yes  If patient does take metformin when was the last dose: 07/23/2020  Date of lab work: 07/23/2020 BUN: 9 CR: 0.99 EGfr:>60  IV site: Right Chest port  Has IV site been added to flowsheet?

## 2020-07-24 ENCOUNTER — Ambulatory Visit
Admission: RE | Admit: 2020-07-24 | Discharge: 2020-07-24 | Disposition: A | Discharge status: Home / Self Care | Payer: No Typology Code available for payment source | Source: Ambulatory Visit | Attending: Radiation Oncology | Admitting: Radiation Oncology

## 2020-07-24 ENCOUNTER — Other Ambulatory Visit: Payer: Self-pay

## 2020-07-24 ENCOUNTER — Telehealth: Payer: Self-pay | Admitting: Internal Medicine

## 2020-07-24 VITALS — BP 150/91 | HR 75 | Temp 97.5°F | Resp 20 | Ht 70.0 in | Wt 223.2 lb

## 2020-07-24 DIAGNOSIS — C7931 Secondary malignant neoplasm of brain: Secondary | ICD-10-CM

## 2020-07-24 DIAGNOSIS — Z510 Encounter for antineoplastic radiation therapy: Secondary | ICD-10-CM | POA: Diagnosis not present

## 2020-07-24 DIAGNOSIS — C3412 Malignant neoplasm of upper lobe, left bronchus or lung: Secondary | ICD-10-CM

## 2020-07-24 MED ORDER — HEPARIN SOD (PORK) LOCK FLUSH 100 UNIT/ML IV SOLN
500.0000 [IU] | Freq: Once | INTRAVENOUS | Status: AC
  Administered 2020-07-24: 500 [IU] via INTRAVENOUS

## 2020-07-24 MED ORDER — SODIUM CHLORIDE 0.9% FLUSH
10.0000 mL | Freq: Once | INTRAVENOUS | Status: AC
  Administered 2020-07-24: 10 mL via INTRAVENOUS

## 2020-07-24 NOTE — Progress Notes (Signed)
I spoke with the patient today to consent him regarding the new finding seen in the right parietal lobe. He has been in observation since completing systemic therapy with Dr. Bobby Rumpf but goes for imaging tomorrow to restage his lung cancer. Dr. Lisbeth Renshaw feels that the new 4 mm lesion in the right parietal lobe would be amenable to another course of SRS in a single fraction. We discussed the risks, benefits, short, and long term effects of radiotherapy, and the patient is interested in proceeding with this single fraction. We will simulate today and proceed with treatment on 07/30/20. I have left a voicemail as well for Dr. Bobby Rumpf, his medical oncologist in [place].    Carola Rhine, PAC

## 2020-07-24 NOTE — Telephone Encounter (Signed)
No appointments scheduled. No scheduling notes provider in the [place].

## 2020-07-24 NOTE — Progress Notes (Signed)
[facility] [place], [place] [address] [phone]   New Patient Evaluation  Date of Service: 07/24/20 Patient Name: Alex Cordova Patient MRN: [id number] Patient DOB: [birth year] Provider: Ventura Sellers, MD  Identifying Statement:  Alex Cordova is a 74 y.o. male with Brain metastasis ([place]) [C79.31] who presents for initial consultation and evaluation regarding cancer associated neurologic deficits.    Referring Provider: Melony Overly, MD [place],  [place] [address]  Primary Cancer: [facility] Lung Stage IV  Oncologic History: 04/18/20: Pre-op SRS R temporal 14 Gy 04/19/20: Craniotomy, resection by Dr. Saintclair Halsted  History of Present Illness: The patient's records from the referring physician were obtained and reviewed and the patient interviewed to confirm this HPI.  [name] presents to clinic today for follow up after 3 months post-SRS/craniotomy MRI scan.  He describes no new or progressive neurologic deficits.  No headaches or seizures.  He no longer takes Keppra and he has recently completed tapering off all corticosteroids.  He remains relatively active around the home, independent with gait and activities of daily living.  Currently on observation and serial imaging monitoring with Dr. Bobby Rumpf for lung cancer.   Medications: Current Outpatient Medications on File Prior to Visit  Medication Sig Dispense Refill  . esomeprazole (NEXIUM) 40 MG capsule Take 40 mg by mouth daily at 12 noon.    . metFORMIN (GLUCOPHAGE) 500 MG tablet Take 500 mg by mouth daily.    . Psyllium (METAMUCIL PO) Take 1 Scoop by mouth daily.    Marland Kitchen levETIRAcetam (KEPPRA) 500 MG tablet Take 1 tablet (500 mg total) by mouth 2 (two) times daily. (Patient not taking: Reported on 07/23/2020) 60 tablet 1   No current facility-administered medications on file prior to visit.    Allergies:  Allergies  Allergen Reactions  .  Sulfamethoxazole Nausea Only    Childhood allergy   Past Medical History:  Past Medical History:  Diagnosis Date  . Arthritis   . Diabetes mellitus type 2 in obese ([place])   . Full dentures   . GERD (gastroesophageal reflux disease)   . Hyperlipidemia   . Lung cancer ([place])   . Metastatic cancer to brain ([place])   . Pneumonia   . Wears glasses    Past Surgical History:  Past Surgical History:  Procedure Laterality Date  . APPLICATION OF CRANIAL NAVIGATION N/A 04/19/2020   Procedure: APPLICATION OF CRANIAL NAVIGATION;  Surgeon: Kary Kos, MD;  Location: [facility];  Service: Neurosurgery;  Laterality: N/A;  APPLICATION OF CRANIAL NAVIGATION  . COLONOSCOPY W/ BIOPSIES AND POLYPECTOMY    . CRANIOTOMY Right 04/19/2020   Procedure: Right Temporal stereotactic craniotomy;  Surgeon: Kary Kos, MD;  Location: [facility];  Service: Neurosurgery;  Laterality: Right;  . FRACTURE SURGERY     right shoulder  . MULTIPLE TOOTH EXTRACTIONS     Social History:  Social History   Socioeconomic History  . Marital status: Married    Spouse name: Not on file  . Number of children: Not on file  . Years of education: Not on file  . Highest education level: Not on file  Occupational History  . Not on file  Tobacco Use  . Smoking status: Former Smoker    Types: Cigarettes  . Smokeless tobacco: Never Used  . Tobacco comment: quit smoking in 1972  Vaping Use  . Vaping Use: Never used  Substance and Sexual Activity  .  Alcohol use: Not Currently    Comment: not since 1972  . Drug use: Never  . Sexual activity: Not on file  Other Topics Concern  . Not on file  Social History Narrative  . Not on file   Social Determinants of Health   Financial Resource Strain:   . Difficulty of Paying Living Expenses:   Food Insecurity:   . Worried About Charity fundraiser in the Last Year:   . Arboriculturist in the Last Year:   Transportation Needs:   . Film/video editor (Medical):   Marland Kitchen Lack of  Transportation (Non-Medical):   Physical Activity:   . Days of Exercise per Week:   . Minutes of Exercise per Session:   Stress:   . Feeling of Stress :   Social Connections:   . Frequency of Communication with Friends and Family:   . Frequency of Social Gatherings with Friends and Family:   . Attends Religious Services:   . Active Member of Clubs or Organizations:   . Attends Archivist Meetings:   Marland Kitchen Marital Status:   Intimate Partner Violence:   . Fear of Current or Ex-Partner:   . Emotionally Abused:   Marland Kitchen Physically Abused:   . Sexually Abused:    Family History: History reviewed. No pertinent family history.  Review of Systems: Constitutional: Doesn't report fevers, chills or abnormal weight loss Eyes: Doesn't report blurriness of vision Ears, nose, mouth, throat, and face: Doesn't report sore throat Respiratory: Doesn't report cough, dyspnea or wheezes Cardiovascular: Doesn't report palpitation, chest discomfort  Gastrointestinal:  Doesn't report nausea, constipation, diarrhea GU: Doesn't report incontinence Skin: Doesn't report skin rashes Neurological: Per HPI Musculoskeletal: Doesn't report joint pain Behavioral/Psych: Doesn't report anxiety  Physical Exam: Vitals:   07/23/20 1152  BP: (!) 154/85  Pulse: 61  Resp: 18  Temp: 97.6 F (36.4 C)  SpO2: 98%   KPS: 90. General: Alert, cooperative, pleasant, in no acute distress Head: Normal EENT: No conjunctival injection or scleral icterus.  Lungs: Resp effort normal Cardiac: Regular rate Abdomen: Non-distended abdomen Skin: No rashes cyanosis or petechiae. Extremities: No clubbing or edema  Neurologic Exam: Mental Status: Awake, alert, attentive to examiner. Oriented to self and environment. Language is fluent with intact comprehension.  Cranial Nerves: Visual acuity is grossly normal. Visual fields are full. Extra-ocular movements intact. No ptosis. Face is symmetric Motor: Tone and bulk are  normal. Power is full in both arms and legs. Reflexes are symmetric, no pathologic reflexes present.  Sensory: Intact to light touch Gait: Normal.   Labs: I have reviewed the data as listed    Component Value Date/Time   NA 139 04/20/2020 0321   K 4.4 04/20/2020 0321   CL 103 04/20/2020 0321   CO2 20 (L) 04/20/2020 0321   GLUCOSE 166 (H) 04/20/2020 0321   BUN 9 07/23/2020 1253   CREATININE 0.99 07/23/2020 1253   CALCIUM 8.0 (L) 04/20/2020 0321   GFRNONAA >60 07/23/2020 1253   GFRAA >60 07/23/2020 1253   Lab Results  Component Value Date   WBC 13.9 (H) 04/19/2020   HGB 14.8 04/19/2020   HCT 44.8 04/19/2020   MCV 87.2 04/19/2020   PLT 204 04/19/2020    Imaging:  MR Brain W Wo Contrast  Result Date: 07/19/2020 CLINICAL DATA:  Metastatic lung cancer. Right temporal lobe lesion follow-up treatment response. EXAM: MRI HEAD WITHOUT AND WITH CONTRAST TECHNIQUE: Multiplanar, multiecho pulse sequences of the brain and surrounding structures were  obtained without and with intravenous contrast. CONTRAST:  32mL GADAVIST GADOBUTROL 1 MMOL/ML IV SOLN COMPARISON:  MRI head 04/20/2020, 04/11/2020 FINDINGS: Brain: Right temporal craniotomy for resection of tumor. Right posterior temporal surgical resection cavity. There is progressive enhancement of the dura and wall of the resection cavity compared with the prior MRI however there is no significant nodularity or suggestion of recurrent tumor. There is significant improvement in edema in the right temporal lobe. New enhancing lesion in the right parietal subcortical white matter. 4 mm rim enhancing lesion compatible metastatic disease. This is not seen on the preop or postoperative MRI. No significant edema. No associated hemorrhage. No other metastatic deposits identified Generalized atrophy. Negative for acute infarct. Chronic microvascular ischemic changes in the white matter. Ventricle size normal. Vascular: Normal arterial flow voids Skull and  upper cervical spine: Right temporal craniotomy. No metastatic lesions in the calvarium. Sinuses/Orbits: Paranasal sinuses clear.  Negative orbit Other: None IMPRESSION: Right temporal lobe resection cavity without recurrent tumor. Decreased edema. There is increased enhancement of the dura and wall of the resection cavity which is most likely related to surgery and radiation, and not tumor. New enhancing lesion in the right parietal subcortical white matter 4 mm, consistent with new metastatic disease not seen on the prior studies. Electronically Signed   By: Franchot Gallo M.D.   On: 07/19/2020 16:09    [facility] Clinician Interpretation: I have personally reviewed the radiological images as listed.  My interpretation, in the context of the patient's clinical presentation, is progressive disease   Assessment/Plan Brain metastasis [facility]) [C79.31]  Bow Buntyn is stable today from a clinical standpoint.  MRI demonstrates good local control of pre-op SRS and craniotomy site, but new metastasis is identified within right corona radiata, not previously treated.    Today we recommended salvage SRS to this small new foci of enhancement, as discussed in brain/spine tumor board on 07/22/20.  He consented verbally to this intervention, understanding the risks and benefits.  He should remain off decadron in the interim.  If neurologic symptoms develop he will let us know.  We spent twenty additional minutes teaching regarding the natural history, biology, and historical experience in the treatment of neurologic complications of cancer.   We appreciate the opportunity to participate in the care of Maxamillian Tienda.  He should return to clinic in 3 months s/p salvage SRS with MRI brain for review.  All questions were answered. The patient knows to call the clinic with any problems, questions or concerns. No barriers to learning were detected.  I have spent a total of 40 minutes of face-to-face and  non-face-to-face time, excluding clinical staff time, preparing to see patient, ordering tests and/or medications, counseling the patient, and independently interpreting results and communicating results to the patient/family/caregiver    Ventura Sellers, MD Medical Director of Neuro-Oncology [facility] [place] 07/24/20 10:35 AM

## 2020-07-26 DIAGNOSIS — Z510 Encounter for antineoplastic radiation therapy: Secondary | ICD-10-CM | POA: Diagnosis not present

## 2020-07-26 DIAGNOSIS — C3412 Malignant neoplasm of upper lobe, left bronchus or lung: Secondary | ICD-10-CM | POA: Diagnosis not present

## 2020-07-30 ENCOUNTER — Ambulatory Visit
Admission: RE | Admit: 2020-07-30 | Discharge: 2020-07-30 | Disposition: A | Discharge status: Home / Self Care | Payer: No Typology Code available for payment source | Source: Ambulatory Visit | Attending: Radiation Oncology | Admitting: Radiation Oncology

## 2020-07-30 ENCOUNTER — Other Ambulatory Visit: Payer: Self-pay

## 2020-07-30 ENCOUNTER — Encounter: Payer: Self-pay | Admitting: Radiation Oncology

## 2020-07-30 DIAGNOSIS — Z510 Encounter for antineoplastic radiation therapy: Secondary | ICD-10-CM | POA: Diagnosis not present

## 2020-07-30 NOTE — Progress Notes (Signed)
Thirty minute nurse monitoring complete following SRS treatment. Patient anxious to leave. Patient denies new pain or neurologic symptoms. Encouraged patient to avoid strenuous activity for the next 24 hours. Encouraged patient to phone [phone] and request the radiation oncologist on call with questions or needs related to his treatment today. Patient verbalized understanding. Patient discharged home in no distress.

## 2020-08-21 NOTE — Progress Notes (Signed)
  Radiation Oncology         [phone]) [phone] ________________________________  Name: Alex Cordova MRN: [id number]  Date: 07/30/2020  DOB: 04-Jan-1946  End of Treatment Note  Diagnosis:  Brain metastasis   Indication for treatment:  palliative       Radiation treatment dates:   07/30/20  Site/dose:    20 Gy in 1 fraction ExacTrac,  3 DCA beams max dose=122.4% PTV2 Rt Parietal 65mm   Narrative: The patient tolerated radiation treatment well.   There were no signs of acute toxicity after treatment.  Plan: The patient has completed radiation treatment. The patient will return to radiation oncology clinic for routine followup in one month. I advised the patient to call or return sooner if they have any questions or concerns related to their recovery or treatment. ________________________________  ------------------------------------------------  Jodelle Gross, MD, PhD

## 2020-08-29 ENCOUNTER — Other Ambulatory Visit: Payer: Self-pay

## 2020-08-29 ENCOUNTER — Encounter (HOSPITAL_COMMUNITY): Payer: Self-pay | Admitting: Oncology

## 2020-08-29 ENCOUNTER — Encounter: Payer: Self-pay | Admitting: Radiation Oncology

## 2020-09-02 ENCOUNTER — Ambulatory Visit
Admission: RE | Admit: 2020-09-02 | Discharge: 2020-09-02 | Disposition: A | Discharge status: Home / Self Care | Payer: No Typology Code available for payment source | Source: Ambulatory Visit | Attending: Radiation Oncology | Admitting: Radiation Oncology

## 2020-09-02 ENCOUNTER — Other Ambulatory Visit: Payer: Self-pay

## 2020-09-02 DIAGNOSIS — C3412 Malignant neoplasm of upper lobe, left bronchus or lung: Secondary | ICD-10-CM

## 2020-09-02 DIAGNOSIS — C7931 Secondary malignant neoplasm of brain: Secondary | ICD-10-CM

## 2020-09-02 NOTE — Progress Notes (Addendum)
  Radiation Oncology         [phone]) [phone] ________________________________  Name: Alex Cordova MRN: [id number]  Date of Service: 09/02/2020  DOB: 1946/08/14  Post Treatment Telephone Note  Diagnosis:  Stage IV, NSCLC, adenocarcinoma of the left lung  Interval Since Last Radiation:  5 weeks   07/30/20 SRS Treatment: PTV2 Rt Parietal 34mm, 20 Gy in 1 fraction  04/18/20 Preoperative SRS Treatment: PTV1 Right Temporal 22 mm target was treated to 15 Gy in 1 fraction  Narrative:  The patient was contacted today for routine follow-up. During treatment he did very well with radiotherapy and did not have significant desquamation. He reports he is feeling better today but has had some trouble with his arthritis lately and his left knee has been bothersome, and his supplements for this had upset his stomach and he's now taking nexium which seems to have helped his symptoms. He denies any headaches or seizure activity. He plans to follow up with Dr. Bobby Rumpf this week to discuss systemic therapy.  Impression/Plan: 1. Stage IV, NSCLC, adenocarcinoma of the left lung. The patient has been doing well since completion of radiotherapy. We discussed that we would be happy to continue to follow him as needed, but he will also continue to follow up with Dr. Bobby Rumpf in medical oncology and Dr. Mickeal Skinner in Neuro Oncology.     Carola Rhine, PAC

## 2020-09-17 DIAGNOSIS — J309 Allergic rhinitis, unspecified: Secondary | ICD-10-CM | POA: Diagnosis not present

## 2020-09-17 DIAGNOSIS — Z20828 Contact with and (suspected) exposure to other viral communicable diseases: Secondary | ICD-10-CM | POA: Diagnosis not present

## 2020-09-17 DIAGNOSIS — R0981 Nasal congestion: Secondary | ICD-10-CM | POA: Diagnosis not present

## 2020-09-17 DIAGNOSIS — R051 Acute cough: Secondary | ICD-10-CM | POA: Diagnosis not present

## 2020-10-01 ENCOUNTER — Other Ambulatory Visit: Payer: Self-pay | Admitting: Hematology and Oncology

## 2020-10-01 DIAGNOSIS — C7931 Secondary malignant neoplasm of brain: Secondary | ICD-10-CM

## 2020-10-01 DIAGNOSIS — C3412 Malignant neoplasm of upper lobe, left bronchus or lung: Secondary | ICD-10-CM

## 2020-10-02 NOTE — Addendum Note (Signed)
Encounter addended by: Hayden Pedro, PA-C on: 10/02/2020 1:12 PM  Actions taken: Clinical Note Signed

## 2020-10-03 NOTE — Procedures (Signed)
  Name: Alex Cordova  MRN: [id number]  Date: 07/30/2020   DOB: December 31, 1945  Stereotactic Radiosurgery Operative Note  PRE-OPERATIVE DIAGNOSIS:  Solitary Brain Metastasis  POST-OPERATIVE DIAGNOSIS:  Solitary Brain Metastasis  PROCEDURE:  Stereotactic Radiosurgery  SURGEON:  Elaina Hoops, MD  NARRATIVE: The patient underwent a radiation treatment planning session in the radiation oncology simulation suite under the care of the radiation oncology physician and physicist.  I participated closely in the radiation treatment planning afterwards. The patient underwent planning CT which was fused to 3T high resolution MRI with 1 mm axial slices.  These images were fused on the planning system.  We contoured the gross target volumes and subsequently expanded this to yield the Planning Target Volume. I actively participated in the planning process.  I helped to define and review the target contours and also the contours of the optic pathway, eyes, brainstem and selected nearby organs at risk.  All the dose constraints for critical structures were reviewed and compared to AAPM Task Group 101.  The prescription dose conformity was reviewed.  I approved the plan electronically.    Accordingly, [name] was brought to the TrueBeam stereotactic radiation treatment linac and placed in the custom immobilization mask.  The patient was aligned according to the IR fiducial markers with BrainLab Exactrac, then orthogonal x-rays were used in ExacTrac with the 6DOF robotic table and the shifts were made to align the patient  [name] received stereotactic radiosurgery uneventfully.    The detailed description of the procedure is recorded in the radiation oncology procedure note.  I was present for the duration of the procedure.  DISPOSITION:  Following delivery, the patient was transported to nursing in stable condition and monitored for possible acute effects to be discharged to home in  stable condition with follow-up in one month.  Elaina Hoops, MD 10/03/2020 4:15 PM

## 2020-10-03 NOTE — Addendum Note (Signed)
Encounter addended by: Kary Kos, MD on: 10/03/2020 4:15 PM  Actions taken: Clinical Note Signed

## 2020-10-07 NOTE — Progress Notes (Signed)
Alex Cordova  [address] [place],  [place]  [address] [phone]  Clinic Day:  10/07/2020  Referring physician: Melony Overly, MD   CHIEF COMPLAINT:  CC: The patient is a 74 year old gentleman with metastatic non-small cell lung cancer, which includes brain metastasis and a metastatic focus of disease to his left acromion process. He presents to clinic today for follow up of oral chemotherapy.  Current Treatment:  Capmatinib 400 mg twice daily   HISTORY OF PRESENT ILLNESS:  Alex Cordova is a 74 y.o. male with a history of metastatic non-small cell lung cancer, which includes brain metastasis and a metastatic focus of disease to his left acromion process who is referred in consultation with Lavera Guise for assessment and management.  His initial lung cancer pathology showed squamous cell carcinoma.  However, pathology from his brain resection done earlier this year and showed adenocarcinoma.  Is a recent brain MRI showed a new focus of disease within his brain, he completed stereotactic radiation a few weeks ago.  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  Patient comes in today for evaluation following placement on capmatinib 400 mg twice daily.   INTERVAL HISTORY:  Alex Cordova is seen in the hematology clinic for follow up of his metastatic non-small cell lung cancer. He denies fever, chills, night sweats, or other signs of infection. He denies cardiorespiratory and gastrointestinal issues. He  denies pain. His appetite is good. His weight has decreased 4 pounds over last 4 weeks. He reports several episodes of nausea and vomiting despite use of antiemetics.  He decreased his capmatinib to 200 mg twice daily and states this is much better tolerated.  He was diagnosed with COVID and did receive the antibody infusion.  He states feeling much better now.  He denies  fever, chills, nausea or vomiting.  He denies issue with bowel or bladder.  He denies increased to shortness of breath, cough or chest pain.  CBC and CMP are pending from today's visit.  REVIEW OF SYSTEMS:  Review of Systems  Constitutional: Negative for appetite change, chills, diaphoresis, fatigue, fever and unexpected weight change.  HENT:   Negative for hearing loss, lump/mass, mouth sores, nosebleeds, sore throat, tinnitus, trouble swallowing and voice change.   Eyes: Negative for eye problems and icterus.  Respiratory: Negative for chest tightness, cough, hemoptysis, shortness of breath and wheezing.   Cardiovascular: Negative for chest pain, leg swelling and palpitations.  Gastrointestinal: Negative for abdominal distention, abdominal pain, blood in stool, constipation, diarrhea, nausea, rectal pain and vomiting.  Endocrine: Negative for hot flashes.  Genitourinary: Negative for bladder incontinence, difficulty urinating, discharge, dyspareunia, dysuria, frequency, hematuria, nocturia and pelvic pain.   Musculoskeletal: Negative for arthralgias, back pain, flank pain, gait problem, myalgias, neck pain and neck stiffness.  Skin: Negative for itching, rash and wound.  Neurological: Negative for dizziness, extremity weakness, gait problem, headaches, light-headedness, numbness, seizures and speech difficulty.  Hematological: Negative for adenopathy. Does not bruise/bleed easily.  Psychiatric/Behavioral: Negative for confusion, decreased concentration, depression, sleep disturbance and suicidal ideas. The patient is not nervous/anxious.      VITALS:  There were no vitals taken for this visit.  Wt Readings from Last 3 Encounters:  09/05/20 227 lb 14.4 oz (103.4 kg)  07/24/20 223 lb 3.2 oz (101.2 kg)  07/23/20 223 lb 9.6 oz (101.4 kg)    There is no height or weight on file  to calculate BMI.  Performance status (ECOG): 1 - Symptomatic but completely ambulatory  PHYSICAL EXAM:    Physical Exam Constitutional:      General: He is not in acute distress.    Appearance: He is not ill-appearing, toxic-appearing or diaphoretic.  HENT:     Head: Normocephalic and atraumatic.     Right Ear: Tympanic membrane, ear canal and external ear normal. There is no impacted cerumen.     Left Ear: Tympanic membrane, ear canal and external ear normal. There is no impacted cerumen.     Nose: Nose normal. No congestion or rhinorrhea.     Mouth/Throat:     Mouth: Mucous membranes are moist.     Pharynx: Oropharynx is clear. No oropharyngeal exudate or posterior oropharyngeal erythema.  Eyes:     General: No scleral icterus.       Right eye: No discharge.        Left eye: No discharge.     Extraocular Movements: Extraocular movements intact.     Conjunctiva/sclera: Conjunctivae normal.     Pupils: Pupils are equal, round, and reactive to light.  Neck:     Vascular: No carotid bruit.  Cardiovascular:     Rate and Rhythm: Normal rate and regular rhythm.     Pulses: Normal pulses.     Heart sounds: Normal heart sounds. No murmur heard.  No friction rub. No gallop.   Pulmonary:     Effort: Pulmonary effort is normal. No respiratory distress.     Breath sounds: Normal breath sounds. No stridor. No wheezing, rhonchi or rales.  Chest:     Chest wall: No tenderness.  Abdominal:     General: Abdomen is flat. Bowel sounds are normal. There is no distension.     Palpations: Abdomen is soft. There is no mass.     Tenderness: There is no abdominal tenderness. There is no right CVA tenderness, left CVA tenderness, guarding or rebound.     Hernia: No hernia is present.  Musculoskeletal:        General: No swelling, tenderness, deformity or signs of injury. Normal range of motion.     Cervical back: No rigidity or tenderness.     Right lower leg: No edema.     Left lower leg: No edema.  Lymphadenopathy:     Cervical: No cervical adenopathy.  Skin:    General: Skin is warm and dry.      Capillary Refill: Capillary refill takes less than 2 seconds.     Coloration: Skin is not jaundiced or pale.     Findings: No bruising, erythema, lesion or rash.  Neurological:     General: No focal deficit present.     Mental Status: He is oriented to person, place, and time. Mental status is at baseline.     Cranial Nerves: No cranial nerve deficit.     Sensory: No sensory deficit.     Motor: No weakness.     Coordination: Coordination normal.     Gait: Gait normal.     Deep Tendon Reflexes: Reflexes normal.  Psychiatric:        Mood and Affect: Mood normal.        Behavior: Behavior normal.        Thought Content: Thought content normal.        Judgment: Judgment normal.    Lymph nodes:   There is no cervical, clavicular, axillary or lymphadenopathy.   LABS:   CBC Latest Ref Rng &  Units 04/19/2020 04/12/2020  WBC 4.0 - 10.5 K/uL 13.9(H) 11.2(H)  Hemoglobin 13.0 - 17.0 g/dL 14.8 13.8  Hematocrit 39 - 52 % 44.8 43.2  Platelets 150 - 400 K/uL 204 208   CMP Latest Ref Rng & Units 07/23/2020 04/20/2020 04/19/2020  Glucose 70 - 99 mg/dL - 166(H) 168(H)  BUN 8 - 23 mg/dL 9 29(H) 26(H)  Creatinine 0.61 - 1.24 mg/dL 0.99 1.34(H) 1.28(H)  Sodium 135 - 145 mmol/L - 139 135  Potassium 3.5 - 5.1 mmol/L - 4.4 4.2  Chloride 98 - 111 mmol/L - 103 103  CO2 22 - 32 mmol/L - 20(L) 21(L)  Calcium 8.9 - 10.3 mg/dL - 8.0(L) 7.9(L)     No results found for: CEA1 / No results found for: CEA1 No results found for: PSA1 No results found for: TKZ601 No results found for: CAN125  No results found for: TOTALPROTELP, ALBUMINELP, A1GS, A2GS, BETS, BETA2SER, GAMS, MSPIKE, SPEI No results found for: TIBC, FERRITIN, IRONPCTSAT No results found for: LDH  STUDIES:  No results found.    HISTORY:   Past Medical History:  Diagnosis Date  . Arthritis   . Diabetes mellitus type 2 in obese ([facility])   . Full dentures   . GERD (gastroesophageal reflux disease)   . Hyperlipidemia   . Lung cancer ([facility])     . Metastatic cancer to brain ([facility])   . Pneumonia   . Wears glasses     Past Surgical History:  Procedure Laterality Date  . APPLICATION OF CRANIAL NAVIGATION N/A 04/19/2020   Procedure: APPLICATION OF CRANIAL NAVIGATION;  Surgeon: Kary Kos, MD;  Location: [facility];  Service: Neurosurgery;  Laterality: N/A;  APPLICATION OF CRANIAL NAVIGATION  . COLONOSCOPY W/ BIOPSIES AND POLYPECTOMY    . CRANIOTOMY Right 04/19/2020   Procedure: Right Temporal stereotactic craniotomy;  Surgeon: Kary Kos, MD;  Location: [facility];  Service: Neurosurgery;  Laterality: Right;  . FRACTURE SURGERY     right shoulder  . MULTIPLE TOOTH EXTRACTIONS      No family history on file.  Social History:  reports that he has quit smoking. His smoking use included cigarettes. He has never used smokeless tobacco. He reports previous alcohol use. He reports that he does not use drugs.The patient is accompanied by his wife today.  Allergies:  Allergies  Allergen Reactions  . Sulfamethoxazole Nausea Only    Childhood allergy    Current Medications: Current Outpatient Medications  Medication Sig Dispense Refill  . acetaminophen (TYLENOL) 325 MG tablet Take 650 mg by mouth every 6 (six) hours as needed.    . calcium-vitamin D (OSCAL WITH D) 500-200 MG-UNIT tablet Take 1 tablet by mouth 2 (two) times daily.    . capmatinib (TABRECTA) 200 MG tablet Take 400 mg by mouth 2 (two) times daily.    Marland Kitchen dexamethasone (DECADRON) 4 MG tablet Take 4 mg by mouth 3 (three) times daily.    Marland Kitchen esomeprazole (NEXIUM) 40 MG capsule Take 40 mg by mouth daily at 12 noon. (Patient not taking: Reported on 09/29/2020)    . ibuprofen (ADVIL) 200 MG tablet Take 200 mg by mouth every 6 (six) hours as needed.    . levETIRAcetam (KEPPRA) 500 MG tablet Take 1 tablet (500 mg total) by mouth 2 (two) times daily. (Patient not taking: Reported on 07/23/2020) 60 tablet 1  . LORazepam (ATIVAN) 0.5 MG tablet 1 tab po 30 minutes prior to radiation or MRI (Patient  not taking: Reported on 08/29/2020) 10 tablet 0  .  metFORMIN (GLUCOPHAGE) 500 MG tablet Take 1,000 mg by mouth daily.     . pantoprazole (PROTONIX) 40 MG tablet Take 40 mg by mouth daily.    . Psyllium (METAMUCIL PO) Take 1 Scoop by mouth daily.     No current facility-administered medications for this visit.     ASSESSMENT & PLAN:   Assessment:  Alex Cordova is a 74 y.o. male with metastatic non-small cell lung cancer.  His initial biopsy revealed squamous cell carcinoma.  However, his metastatic temporal lobe lesion came back showing adenocarcinoma.  He does have the met exon 14 skipping mutation, for which targeted therapy is available. He was placed on capmatinib 400 mg twice daily.    He had several issues of severe nausea and vomiting despite antiemetics with the dose of 400 mg twice daily.  He opted to decrease his dose on his own to 200 mg twice daily.He appears to be tolerating this well.    Plan: 1.  He will continue  capmatinib 200 mg twice daily 2.  He will follow with his PCP regarding increasing knee pain which is most likely related to arthritis. 3.   We will see him back in 4 weeks with repeat CBC, CMP, CT imaging of the chest /abdomen/pelvis and evaluation with Dr. Bobby Rumpf.  I discussed the assessment and treatment plan with the patient.  The patient was provided an opportunity to ask questions and all were answered.  The patient agreed with the plan and demonstrated an understanding of the instructions.  The patient was advised to call back if the symptoms worsen or if the condition fails to improve as anticipated.  Thank you for the opportunity    I provided 30 minutes (2:07 PM - 2:07 PM) of face-to-face time during this this encounter and > 50% was spent counseling as documented under my assessment and plan.    Melodye Ped, NP

## 2020-10-07 NOTE — Progress Notes (Signed)
September 24, 2020 11:22:45 AM Dairl Ponder RN: Pt will be taking Tabrecta 200mg  2 tabs po BID. Tabrecta is a pill that is taken BID by mouth, with or without food. Tablets should be swallowed whole, do not crush/chew. If dose is missed due to emesis or just forgotten, DO NOT MAKEUP THE DOSE. Restart the med @ next reguar scheduled time. Common side effects are fatigue, hypoalbuminemia, increased blood amylase, increased liver enzymes, increased serum creatnine, N/V and peripheral edema (in legs or ankles). Less common side effects are back pain, chest pain (not related to heart), cough, decreased appetite, dyspnea/SOB, diarrhea or constipation, fever, increased potassum, low blood counts (hgb, leukocytes & lymphocytes), hypoglycemia, hyponatremia, & low phosphorus. RARE side effects are photosensitivity, & pulmonary toxicity. September 24, 2020 3:20:44 PM Dairl Ponder RN: I spoke with pt. he will start his Tabrecta 200mg  2 tabs po BID in the morning. He knows to take this medication at least an hour from any other of his regular medicines.  October 02, 2020 4:20:16 PM Dairl Ponder RN: I spoke wiht pt's wife, as he was in shower. She states he is feeling better since over COVID. She mentioned he is only taking 1 tablet of Tabrecta BID w/food. He tried taking 2 tabs BID (as ordered) for a couple of days, but just made him "too sick". Pt did take anti emetic before both doses, & just didnt help much. [phone]

## 2020-10-08 ENCOUNTER — Other Ambulatory Visit: Payer: Self-pay

## 2020-10-08 ENCOUNTER — Telehealth: Payer: Self-pay | Admitting: Oncology

## 2020-10-08 ENCOUNTER — Inpatient Hospital Stay (INDEPENDENT_AMBULATORY_CARE_PROVIDER_SITE_OTHER): Payer: No Typology Code available for payment source | Admitting: Hematology and Oncology

## 2020-10-08 ENCOUNTER — Inpatient Hospital Stay: Payer: No Typology Code available for payment source | Attending: Internal Medicine

## 2020-10-08 ENCOUNTER — Encounter: Payer: Self-pay | Admitting: Hematology and Oncology

## 2020-10-08 VITALS — BP 158/84 | HR 71 | Temp 97.7°F | Resp 18 | Wt 223.3 lb

## 2020-10-08 DIAGNOSIS — Z9221 Personal history of antineoplastic chemotherapy: Secondary | ICD-10-CM | POA: Insufficient documentation

## 2020-10-08 DIAGNOSIS — C7931 Secondary malignant neoplasm of brain: Secondary | ICD-10-CM | POA: Diagnosis not present

## 2020-10-08 DIAGNOSIS — C3412 Malignant neoplasm of upper lobe, left bronchus or lung: Secondary | ICD-10-CM

## 2020-10-08 DIAGNOSIS — Z923 Personal history of irradiation: Secondary | ICD-10-CM | POA: Insufficient documentation

## 2020-10-08 DIAGNOSIS — C3490 Malignant neoplasm of unspecified part of unspecified bronchus or lung: Secondary | ICD-10-CM | POA: Insufficient documentation

## 2020-10-08 LAB — CBC WITH DIFFERENTIAL (CANCER CENTER ONLY)
Abs Immature Granulocytes: 0.02 10*3/uL (ref 0.00–0.07)
Basophils Absolute: 0 10*3/uL (ref 0.0–0.1)
Basophils Relative: 1 %
Eosinophils Absolute: 0.2 10*3/uL (ref 0.0–0.5)
Eosinophils Relative: 4 %
HCT: 44.7 % (ref 39.0–52.0)
Hemoglobin: 14.7 g/dL (ref 13.0–17.0)
Immature Granulocytes: 0 %
Lymphocytes Relative: 24 %
Lymphs Abs: 1.6 10*3/uL (ref 0.7–4.0)
MCH: 29.6 pg (ref 26.0–34.0)
MCHC: 32.9 g/dL (ref 30.0–36.0)
MCV: 89.9 fL (ref 80.0–100.0)
Monocytes Absolute: 0.7 10*3/uL (ref 0.1–1.0)
Monocytes Relative: 10 %
Neutro Abs: 4.1 10*3/uL (ref 1.7–7.7)
Neutrophils Relative %: 61 %
Platelet Count: 220 10*3/uL (ref 150–400)
RBC: 4.97 MIL/uL (ref 4.22–5.81)
RDW: 13.2 % (ref 11.5–15.5)
WBC Count: 6.6 10*3/uL (ref 4.0–10.5)
nRBC: 0 % (ref 0.0–0.2)

## 2020-10-08 LAB — CMP (CANCER CENTER ONLY)
ALT: 26 U/L (ref 0–44)
AST: 23 U/L (ref 15–41)
Albumin: 3.9 g/dL (ref 3.5–5.0)
Alkaline Phosphatase: 60 U/L (ref 38–126)
Anion gap: 13 (ref 5–15)
BUN: 12 mg/dL (ref 8–23)
CO2: 23 mmol/L (ref 22–32)
Calcium: 8.9 mg/dL (ref 8.9–10.3)
Chloride: 102 mmol/L (ref 98–111)
Creatinine: 1.38 mg/dL — ABNORMAL HIGH (ref 0.61–1.24)
GFR, Estimated: 53 mL/min — ABNORMAL LOW (ref >60)
Glucose, Bld: 133 mg/dL — ABNORMAL HIGH (ref 70–99)
Potassium: 3.5 mmol/L (ref 3.5–5.1)
Sodium: 138 mmol/L (ref 135–145)
Total Bilirubin: 0.7 mg/dL (ref 0.3–1.2)
Total Protein: 6.6 g/dL (ref 6.5–8.1)

## 2020-10-08 NOTE — Telephone Encounter (Signed)
Schedule appts per 11/2 LOS.Gave appt to patient.

## 2020-10-15 ENCOUNTER — Telehealth: Payer: Self-pay

## 2020-10-15 NOTE — Telephone Encounter (Signed)
I called Mr Danzer to completed cy 1 wk Tabrecta f/u assessment. Pt states he rarely is having nausea now. He still doesn't feel the best, just tired a lot. Denies N/V, C/D, & has remained afebrile. No missed doses. Is taking 1 Tabrecta BID. [phone]

## 2020-10-17 ENCOUNTER — Other Ambulatory Visit: Payer: No Typology Code available for payment source

## 2020-10-17 ENCOUNTER — Ambulatory Visit: Payer: No Typology Code available for payment source | Admitting: Oncology

## 2020-10-22 ENCOUNTER — Other Ambulatory Visit: Payer: Self-pay | Admitting: Radiation Therapy

## 2020-10-22 DIAGNOSIS — C7949 Secondary malignant neoplasm of other parts of nervous system: Secondary | ICD-10-CM

## 2020-10-22 DIAGNOSIS — C7931 Secondary malignant neoplasm of brain: Secondary | ICD-10-CM

## 2020-10-29 ENCOUNTER — Other Ambulatory Visit: Payer: Self-pay | Admitting: Oncology

## 2020-10-29 DIAGNOSIS — C3412 Malignant neoplasm of upper lobe, left bronchus or lung: Secondary | ICD-10-CM

## 2020-10-30 NOTE — Progress Notes (Signed)
September 24, 2020 11:22:45 AM Dairl Ponder RN: Pt will be taking Tabrecta 200mg  2 tabs po BID. Tabrecta is a pill that is taken BID by mouth, with or without food. Tablets should be swallowed whole, do not crush/chew. If dose is missed due to emesis or just forgotten, DO NOT MAKEUP THE DOSE. Restart the med @ next reguar scheduled time. Common side effects are fatigue, hypoalbuminemia, increased blood amylase, increased liver enzymes, increased serum creatnine, N/V and peripheral edema (in legs or ankles). Less common side effects are back pain, chest pain (not related to heart), cough, decreased appetite, dyspnea/SOB, diarrhea or constipation, fever, increased potassum, low blood counts (hgb, leukocytes & lymphocytes), hypoglycemia, hyponatremia, & low phosphorus. RARE side effects are photosensitivity, & pulmonary toxicity. September 24, 2020 3:20:44 PM Dairl Ponder RN: I spoke with pt. he will start his Tabrecta 200mg  2 tabs po BID in the morning. He knows to take this medication at least an hour from any other of his regular medicines.  October 02, 2020 4:20:16 PM Dairl Ponder RN: I spoke with pt's wife, as he was in shower. She states he is feeling better since over COVID. She mentioned he is only taking 1 tablet of Tabrecta BID w/food. He tried taking 2 tabs BID (as ordered) for a couple of days, but just made him "too sick". Pt did take anti emetic before both doses, & just didnt help much. [phone] October 15, 2020 3:25:08 PM Dairl Ponder RN: I spoke with pt,he is tired and doesnt feel as good as before taking Tabrecta. Oral chemo assessment call completed. October 30, 2020 3:56:08 PM Dairl Ponder RN: I spoke with pt, states he is doing pretty good.  Pt has decreased Tabrecta 200mg  to 1 tab po BID. Has occasional nausea. Has missed 1-2 doses. (Abstracted the above info to EPIC)

## 2020-11-04 ENCOUNTER — Other Ambulatory Visit: Payer: Self-pay | Admitting: Oncology

## 2020-11-04 NOTE — Progress Notes (Signed)
[facility]  [address] [place],  [place]  [address] [phone]  Clinic Day:  11/05/2020  Referring physician: Nicoletta Dress, MD   HISTORY OF PRESENT ILLNESS:  The patient is a 74 y.o. male with metastatic non-small cell lung cancer, which includes brain metastasis and a metastatic focus of disease to his left acromion process.  His initial lung cancer pathology showed squamous cell carcinoma.  However, pathology from his brain resection done earlier this year showed adenocarcinoma.  As a recent brain MRI showed a new focus of disease within his brain, he also underwent stereotactic radiation.  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  He comes in today for routine follow-up. Since his last visit, the patient  has been doing fairly well.  He denies having any significant side effects with his targeted oral therapy.  He also denies having any new symptoms or findings which concern him for possible disease progression while on capmatinib.  PHYSICAL EXAM:  Blood pressure (!) 200/110, pulse 80, temperature 97.8 F (36.6 C), resp. rate 18, height 5' 10.5" (1.791 m), weight 227 lb 3.2 oz (103.1 kg), SpO2 96 %. Wt Readings from Last 3 Encounters:  11/05/20 227 lb 3.2 oz (103.1 kg)  10/08/20 223 lb 4.8 oz (101.3 kg)  09/05/20 227 lb 14.4 oz (103.4 kg)   Body mass index is 32.14 kg/m. Performance status (ECOG): 2 Physical Exam Constitutional:      Appearance: Normal appearance. He is not ill-appearing.  HENT:     Mouth/Throat:     Mouth: Mucous membranes are moist.     Pharynx: Oropharynx is clear. No oropharyngeal exudate or posterior oropharyngeal erythema.  Cardiovascular:     Rate and Rhythm: Normal rate and regular rhythm.     Heart sounds: No murmur heard.  No friction rub. No gallop.   Pulmonary:     Effort: Pulmonary effort is  normal. No respiratory distress.     Breath sounds: Normal breath sounds. No wheezing, rhonchi or rales.  Abdominal:     General: Bowel sounds are normal. There is no distension.     Palpations: Abdomen is soft. There is no mass.     Tenderness: There is no abdominal tenderness.  Musculoskeletal:        General: No swelling.     Right lower leg: No edema.     Left lower leg: No edema.  Lymphadenopathy:     Cervical: No cervical adenopathy.     Upper Body:     Right upper body: No supraclavicular or axillary adenopathy.     Left upper body: No supraclavicular or axillary adenopathy.     Lower Body: No right inguinal adenopathy. No left inguinal adenopathy.  Skin:    General: Skin is warm.     Coloration: Skin is not jaundiced.     Findings: No lesion or rash.  Neurological:     General: No focal deficit present.     Mental Status: He is alert and oriented to person, place, and time. Mental status is at baseline.     Cranial Nerves: Cranial nerves are intact.  Psychiatric:        Mood and Affect: Mood normal.        Behavior: Behavior normal.        Thought Content: Thought content normal.  LABS:   CBC Latest Ref Rng & Units 11/05/2020 10/08/2020 04/19/2020  WBC - 6.3 6.6 13.9(H)  Hemoglobin 13.5 - 17.5 15.5 14.7 14.8  Hematocrit 41 - 53 47 44.7 44.8  Platelets 150 - 399 179 220 204   CMP Latest Ref Rng & Units 11/05/2020 10/08/2020 07/23/2020  Glucose 70 - 99 mg/dL - 133(H) -  BUN 4 - 21 11 12 9   Creatinine 0.6 - 1.3 1.1 1.38(H) 0.99  Sodium 137 - 147 140 138 -  Potassium 3.4 - 5.3 4.0 3.5 -  Chloride 99 - 108 104 102 -  CO2 13 - 22 28(A) 23 -  Calcium 8.7 - 10.7 9.2 8.9 -  Total Protein 6.5 - 8.1 g/dL - 6.6 -  Total Bilirubin 0.3 - 1.2 mg/dL - 0.7 -  Alkaline Phos 25 - 125 63 60 -  AST 14 - 40 23 23 -  ALT 10 - 40 21 26 -    ASSESSMENT & PLAN:  Assessment/Plan:  A 74 y.o. male with metastatic non-small cell cell lung cancer which harbors the MET exon 14 skipping  mutation.  His initial biopsy revealed squamous cell carcinoma but, his metastatic temporal lobe lesion came back showing adenocarcinoma. Currently, he is on capmatinib, which he has been tolerating well.  Clinically, the patient appears to be doing fine.  I will see him back in another 6 weeks for repeat clinical assessment.  CT scans will be done before his next visit to ascertain his new disease baseline after 3 months of capamatinib.  Of note, he is scheduled for a brain MRI later this week to ensure there has been no evidence of CNS disease progression.  The patient understands all the plans discussed today and is in agreement with them.      Kamaya Keckler Macarthur Critchley, MD

## 2020-11-05 ENCOUNTER — Other Ambulatory Visit: Payer: Self-pay

## 2020-11-05 ENCOUNTER — Other Ambulatory Visit: Payer: Self-pay | Admitting: Hematology and Oncology

## 2020-11-05 ENCOUNTER — Encounter: Payer: Self-pay | Admitting: Oncology

## 2020-11-05 ENCOUNTER — Telehealth: Payer: Self-pay | Admitting: Oncology

## 2020-11-05 ENCOUNTER — Inpatient Hospital Stay: Payer: No Typology Code available for payment source

## 2020-11-05 ENCOUNTER — Inpatient Hospital Stay (INDEPENDENT_AMBULATORY_CARE_PROVIDER_SITE_OTHER): Payer: No Typology Code available for payment source | Admitting: Oncology

## 2020-11-05 VITALS — BP 200/110 | HR 80 | Temp 97.8°F | Resp 18 | Ht 70.5 in | Wt 227.2 lb

## 2020-11-05 DIAGNOSIS — C3412 Malignant neoplasm of upper lobe, left bronchus or lung: Secondary | ICD-10-CM

## 2020-11-05 LAB — COMPREHENSIVE METABOLIC PANEL
Albumin: 3.6 (ref 3.5–5.0)
Calcium: 9.2 (ref 8.7–10.7)

## 2020-11-05 LAB — HEPATIC FUNCTION PANEL
ALT: 21 (ref 10–40)
AST: 23 (ref 14–40)
Alkaline Phosphatase: 63 (ref 25–125)
Bilirubin, Total: 0.6

## 2020-11-05 LAB — BASIC METABOLIC PANEL
BUN: 11 (ref 4–21)
CO2: 28 — AB (ref 13–22)
Chloride: 104 (ref 99–108)
Creatinine: 1.1 (ref 0.6–1.3)
Glucose: 120
Potassium: 4 (ref 3.4–5.3)
Sodium: 140 (ref 137–147)

## 2020-11-05 LAB — CBC AND DIFFERENTIAL
HCT: 47 (ref 41–53)
Hemoglobin: 15.5 (ref 13.5–17.5)
Neutrophils Absolute: 3.97
Platelets: 179 (ref 150–399)
WBC: 6.3

## 2020-11-05 LAB — CBC
MCV: 89 (ref 76–111)
RBC: 5.27 — AB (ref 3.87–5.11)

## 2020-11-05 MED ORDER — LORAZEPAM 0.5 MG PO TABS: ORAL_TABLET | ORAL | 0 refills | Status: DC

## 2020-11-05 NOTE — Telephone Encounter (Signed)
Per 11/30 LOS, patient scheduled for 12/17/19 Labs, CT Scan and 1/22 Follow up

## 2020-11-08 ENCOUNTER — Ambulatory Visit (HOSPITAL_COMMUNITY): Admission: RE | Admit: 2020-11-08 | Payer: No Typology Code available for payment source | Source: Ambulatory Visit

## 2020-11-12 ENCOUNTER — Ambulatory Visit (HOSPITAL_COMMUNITY): Payer: No Typology Code available for payment source

## 2020-11-14 ENCOUNTER — Ambulatory Visit: Payer: No Typology Code available for payment source | Admitting: Internal Medicine

## 2020-11-21 ENCOUNTER — Other Ambulatory Visit: Payer: Self-pay

## 2020-11-21 ENCOUNTER — Ambulatory Visit (HOSPITAL_COMMUNITY)
Admission: RE | Admit: 2020-11-21 | Discharge: 2020-11-21 | Disposition: A | Discharge status: Home / Self Care | Payer: No Typology Code available for payment source | Source: Ambulatory Visit | Attending: Radiation Oncology | Admitting: Radiation Oncology

## 2020-11-21 DIAGNOSIS — C7949 Secondary malignant neoplasm of other parts of nervous system: Secondary | ICD-10-CM | POA: Diagnosis present

## 2020-11-21 DIAGNOSIS — C7931 Secondary malignant neoplasm of brain: Secondary | ICD-10-CM | POA: Diagnosis not present

## 2020-11-21 IMAGING — MR MR HEAD WO/W CM
10 of 13 series · 21 of 48 positions shown · IV contrast (10 ML GAD)
Comparison: MRI of the brain [DATE].

CLINICAL DATA: Brain/CNS neoplasm. Follow-up of treated metastatic
disease to the brain.

EXAM:
MRI HEAD WITHOUT AND WITH CONTRAST
TECHNIQUE: Multiplanar, multiecho pulse sequences of the brain and surrounding
structures were obtained without and with intravenous contrast.
CONTRAST:  10mL GADAVIST GADOBUTROL 1 MMOL/ML IV SOLN

[Series 2: FLAIR · sagittal · 3.0mm · 0.47mm/px · 2 of 47 slices shown (1 of 2)]
[im 1/47]
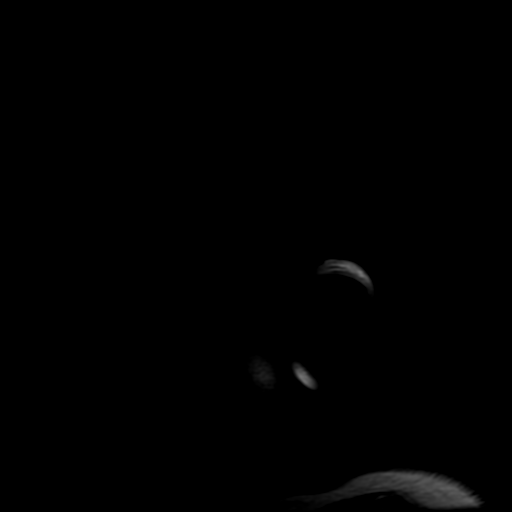
[im 47/47]
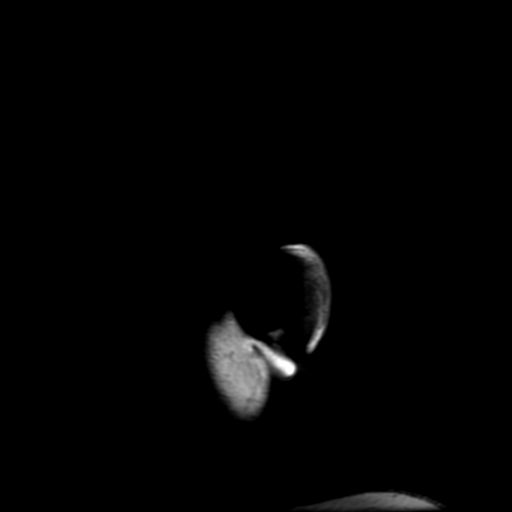

[Series 3: DWI · axial · 3.0mm · 0.94mm/px · z∈[-83,+88]mm · 3 of 116 slices shown]
[im 1/116]
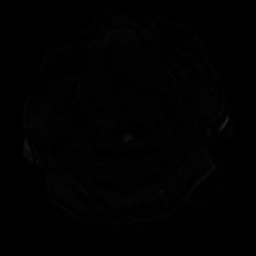
[im 58/116]
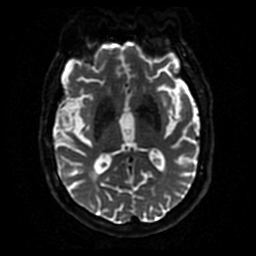
[im 116/116]
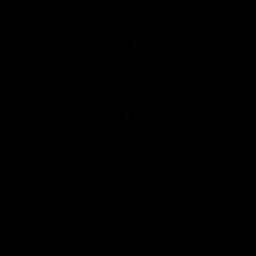

[Series 4: FLAIR · axial · 3.0mm · 0.47mm/px · 1 of 57 slices shown (2 of 2)]
[im 1/57]
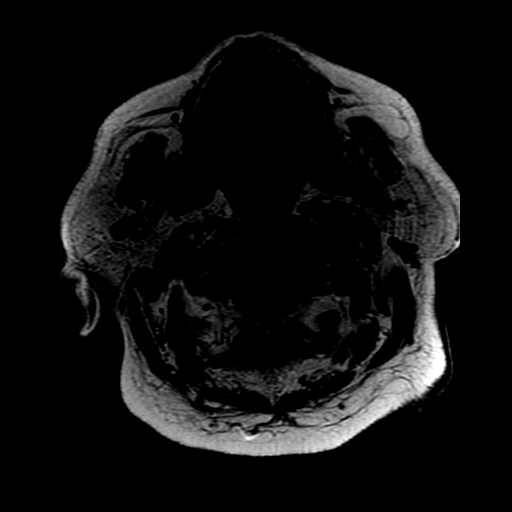

[Series 5: SWI · axial · 3.0mm · 0.47mm/px · z∈[-85,+87]mm · 3 of 116 slices shown]
[im 1/116]
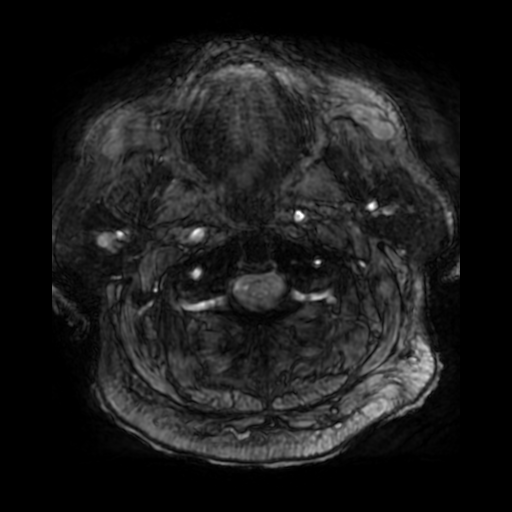
[im 58/116]
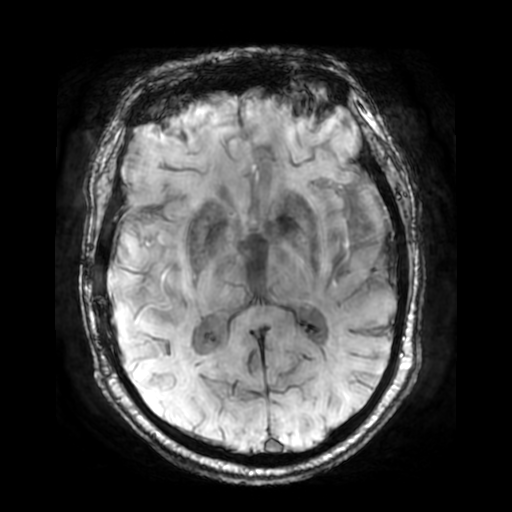
[im 116/116]
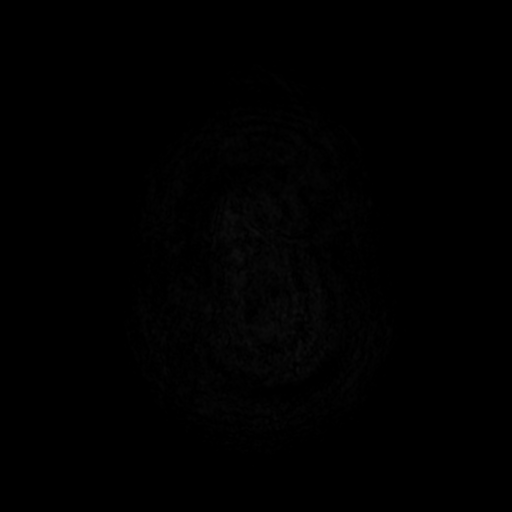

[Series 7: T2 post-contrast · coronal · 3.0mm · 0.39mm/px · 1 of 56 slices shown (1 of 2)]
[im 1/56]
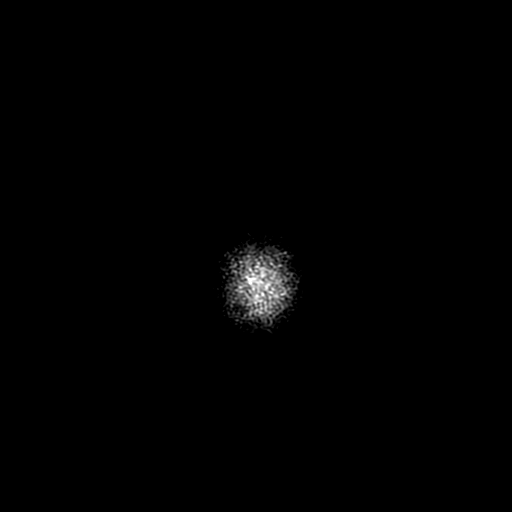

[Series 8: T2 post-contrast · axial · 5.0mm · 0.47mm/px · 1 of 30 slices shown (2 of 2)]
[im 1/30]
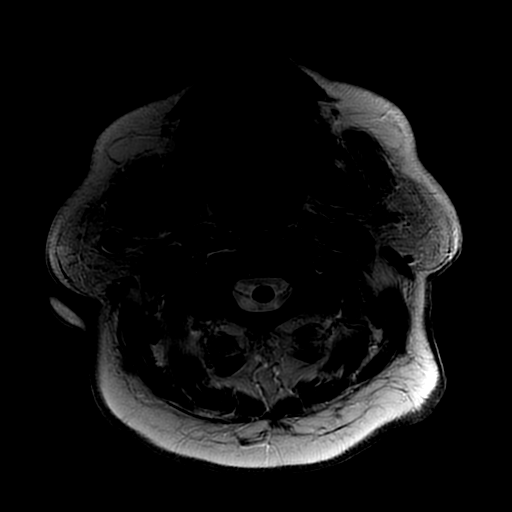

[Series 9: T1 post-contrast · coronal · 3.0mm · 0.39mm/px · 1 of 56 slices shown]
[im 1/56]
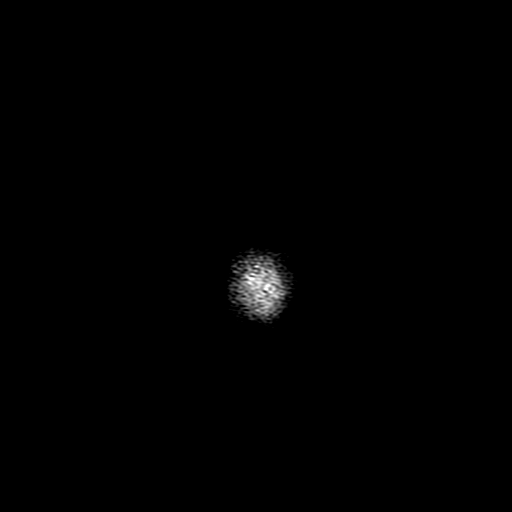

[Series 10: FLAIR post-contrast · sagittal · 3.0mm · 0.47mm/px · 1 of 47 slices shown]
[im 1/47]
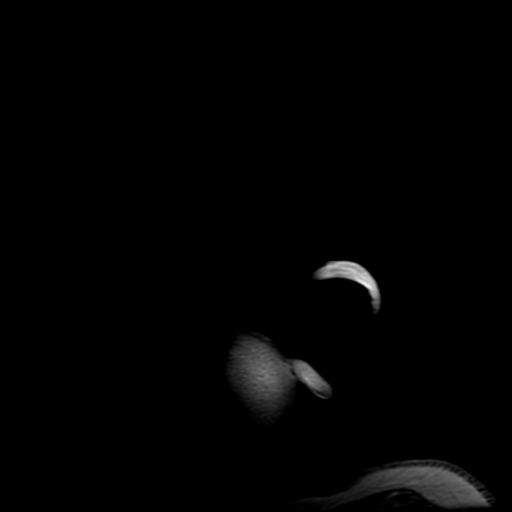

[Series 350: ADC · axial · 3.0mm · 0.94mm/px · 1 of 58 slices shown]
[im 1/58]
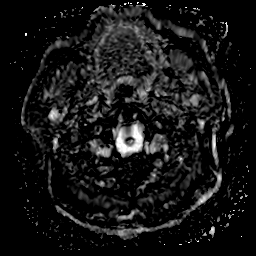

[Series 600: multiplanar reconstruction (mpr) · axial · 0.9mm · 0.50mm/px · z∈[-157,+97]mm · 7 of 301 slices shown]
[im 1/301]
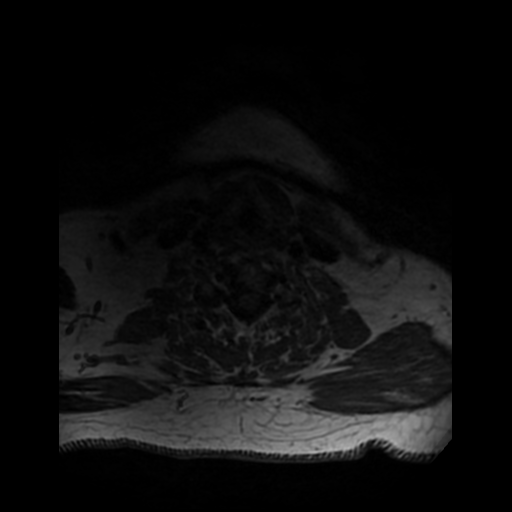
[im 51/301]
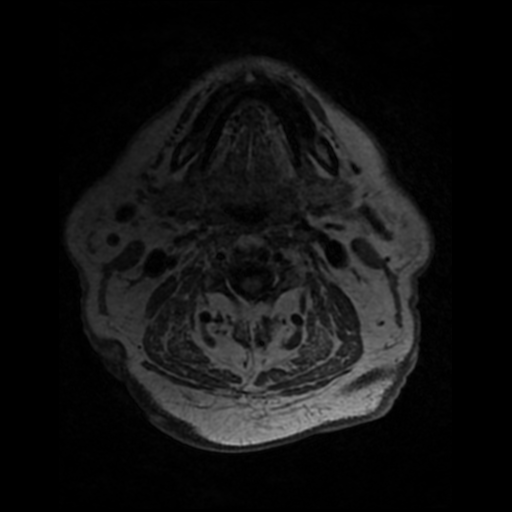
[im 101/301]
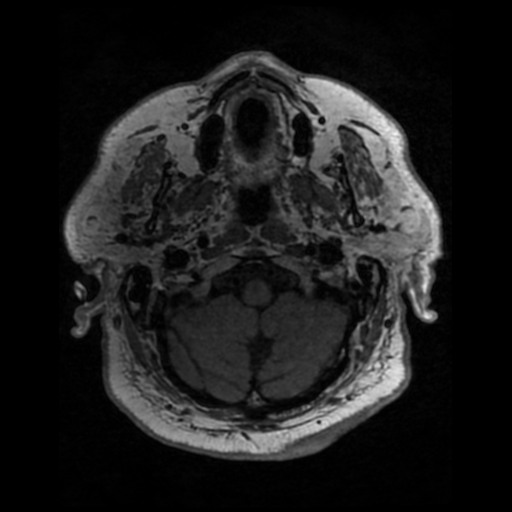
[im 151/301]
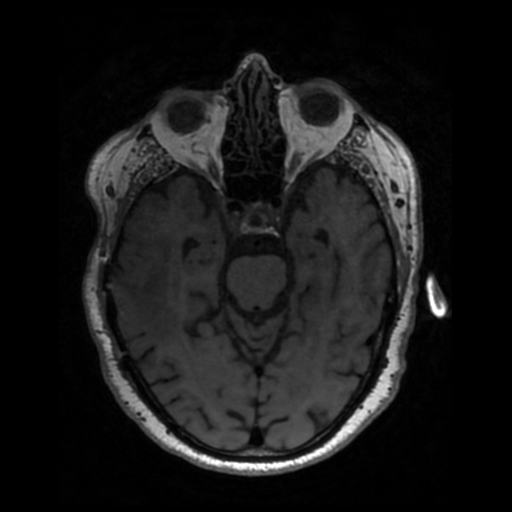
[im 201/301]
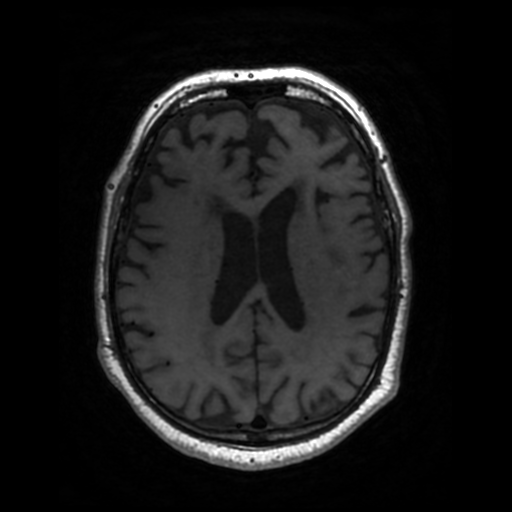
[im 251/301]
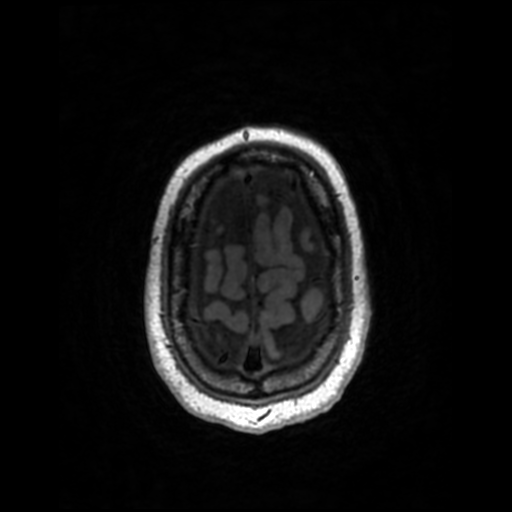
[im 301/301]
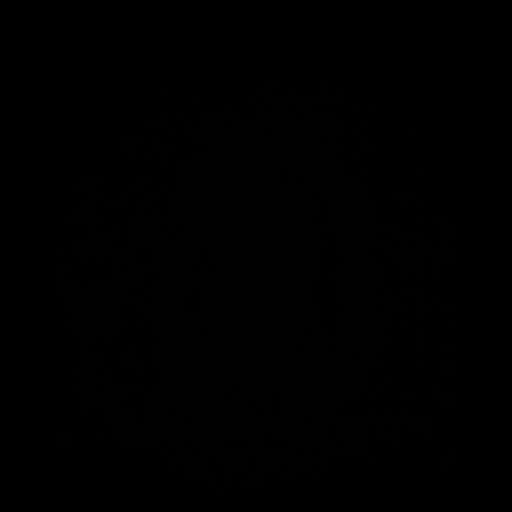

[21 of 48 positions shown; findings below may reference images not displayed]

FINDINGS: Brain: Interval increase in thickness and contrast enhancement of
the dura subjacent to the right temporal craniotomy. There is also
increase of the irregular contrast enhancement within the surgical
cavity in the right temporal lobe with increased surrounding
parenchymal edema, concerning for recurrent disease. The area of
contrast enhancement measures approximately 2.6 x 1.8 x 1.4 cm. 3 mm
right parietal lesion now show solid enhancement. No new focus of
abnormal contrast enhancement identified.

No hydrocephalus, hemorrhage or acute infarct. Mild-to-moderate
chronic white matter ischemic changes.

Vascular: Normal flow voids.

Skull and upper cervical spine: Postsurgical changes from right
temporal craniotomy. No focal marrow lesion identified.

Sinuses/Orbits: Negative.
IMPRESSION: 1. Interval increase in thickness and contrast enhancement of the
dura subjacent to the right temporal craniotomy with increased
irregular contrast enhancement within the surgical cavity in the
right temporal lobe with increased surrounding parenchymal edema.
Findings are concerning for recurrent disease.
2. 3 mm right parietal metastatic lesion now show solid enhancement.
No new focus of abnormal contrast enhancement identified.

## 2020-11-21 MED ORDER — GADOBUTROL 1 MMOL/ML IV SOLN
10.0000 mL | Freq: Once | INTRAVENOUS | Status: AC | PRN
  Administered 2020-11-21: 10 mL via INTRAVENOUS

## 2020-11-22 ENCOUNTER — Ambulatory Visit: Payer: No Typology Code available for payment source | Admitting: Internal Medicine

## 2020-11-26 ENCOUNTER — Telehealth: Payer: Self-pay | Admitting: *Deleted

## 2020-11-26 NOTE — Telephone Encounter (Signed)
Community Care Nurse Abby with [facility] called [phone].  Needed to confirm patients next appointment.  She advised to reach out to her if the patient should need any further referrals.

## 2020-11-27 ENCOUNTER — Telehealth: Payer: Self-pay

## 2020-11-27 ENCOUNTER — Other Ambulatory Visit: Payer: Self-pay

## 2020-11-27 MED ORDER — ALOGLIPTIN BENZOATE 25 MG PO TABS: 0.5000 | ORAL_TABLET | Freq: Two times a day (BID) | ORAL | 0 refills | Status: DC

## 2020-11-27 NOTE — Telephone Encounter (Signed)
I called pt to discuss how he is tolerating Tabrecta. Pt states he is doing pretty good. He still has some itching- he is using cream/lotion. He was told he could use benadryl, or something for itching OTC. He restarted the Tabrecta the evening of 11/22/2020, when it was delivered to his home. He has only missed 1 dose since then. He takes the medication @ 7 am, and around 7 pm, usually without eating. He knows if dose is missed, to skip and return to regular scheduling.

## 2020-11-28 ENCOUNTER — Ambulatory Visit: Payer: No Typology Code available for payment source | Admitting: Internal Medicine

## 2020-11-28 ENCOUNTER — Other Ambulatory Visit: Payer: Self-pay

## 2020-11-28 ENCOUNTER — Inpatient Hospital Stay: Payer: No Typology Code available for payment source | Attending: Internal Medicine | Admitting: Internal Medicine

## 2020-11-28 VITALS — BP 148/81 | HR 59 | Temp 96.5°F | Resp 18 | Ht 70.5 in | Wt 234.2 lb

## 2020-11-28 DIAGNOSIS — E669 Obesity, unspecified: Secondary | ICD-10-CM | POA: Insufficient documentation

## 2020-11-28 DIAGNOSIS — Z79899 Other long term (current) drug therapy: Secondary | ICD-10-CM | POA: Insufficient documentation

## 2020-11-28 DIAGNOSIS — E119 Type 2 diabetes mellitus without complications: Secondary | ICD-10-CM | POA: Insufficient documentation

## 2020-11-28 DIAGNOSIS — E785 Hyperlipidemia, unspecified: Secondary | ICD-10-CM | POA: Insufficient documentation

## 2020-11-28 DIAGNOSIS — Z87891 Personal history of nicotine dependence: Secondary | ICD-10-CM | POA: Diagnosis not present

## 2020-11-28 DIAGNOSIS — C7931 Secondary malignant neoplasm of brain: Secondary | ICD-10-CM | POA: Diagnosis present

## 2020-11-28 DIAGNOSIS — Z791 Long term (current) use of non-steroidal anti-inflammatories (NSAID): Secondary | ICD-10-CM | POA: Diagnosis not present

## 2020-11-28 DIAGNOSIS — K219 Gastro-esophageal reflux disease without esophagitis: Secondary | ICD-10-CM | POA: Insufficient documentation

## 2020-11-28 DIAGNOSIS — C3490 Malignant neoplasm of unspecified part of unspecified bronchus or lung: Secondary | ICD-10-CM | POA: Diagnosis present

## 2020-11-28 DIAGNOSIS — Z7952 Long term (current) use of systemic steroids: Secondary | ICD-10-CM | POA: Diagnosis not present

## 2020-11-28 NOTE — Progress Notes (Signed)
[facility] [place], [place] [address] [phone]   New Patient Evaluation  Date of Service: 11/28/20 Patient Name: Alex Cordova Patient MRN: [id number] Patient DOB: [birth year] Provider: Ventura Sellers, MD  Identifying Statement:  Alex Cordova is a 74 y.o. male with Brain metastasis [facility]) [C79.31]   Primary Cancer: [place] Lung Stage IV  Oncologic History: 04/18/20: Pre-op SRS R temporal 14 Gy 04/19/20: Craniotomy, resection by Dr. Saintclair Halsted 07/30/20: Salvage SRS R subcortex  Interval History:  Alex Cordova presents today for follow up after recent MRI brain.  He describes on new or progressive deficits.  Denies left sided weakness or numbness. No seizures or headaches.  Continues on Capmatinib as prior.  H+P (07/23/20) Patient presents to clinic today for follow up after 3 months post-SRS/craniotomy MRI scan.  He describes no new or progressive neurologic deficits.  No headaches or seizures.  He no longer takes Keppra and he has recently completed tapering off all corticosteroids.  He remains relatively active around the home, independent with gait and activities of daily living.  Currently on observation and serial imaging monitoring with Dr. Bobby Rumpf for lung cancer.   Medications: Current Outpatient Medications on File Prior to Visit  Medication Sig Dispense Refill  . acetaminophen (TYLENOL) 325 MG tablet Take 650 mg by mouth every 6 (six) hours as needed.    . Alogliptin Benzoate 25 MG TABS Take 0.5 tablets by mouth in the morning and at bedtime. 60 tablet 0  . calcium-vitamin D (OSCAL WITH D) 500-200 MG-UNIT tablet Take 1 tablet by mouth 2 (two) times daily.    . capmatinib (TABRECTA) 200 MG tablet Take 400 mg by mouth 2 (two) times daily.    Marland Kitchen esomeprazole (NEXIUM) 40 MG capsule Take 40 mg by mouth daily at 12 noon.    Marland Kitchen ibuprofen (ADVIL) 200 MG tablet Take 200 mg by mouth every 6 (six) hours as needed.     Marland Kitchen LORazepam (ATIVAN) 0.5 MG tablet 1 tab po 30 minutes prior to radiation or MRI 10 tablet 0  . Psyllium (METAMUCIL PO) Take 1 Scoop by mouth daily.    Marland Kitchen dexamethasone (DECADRON) 4 MG tablet Take 4 mg by mouth 3 (three) times daily. (Patient not taking: Reported on 11/28/2020)     No current facility-administered medications on file prior to visit.    Allergies:  Allergies  Allergen Reactions  . Sulfamethoxazole Nausea Only    Childhood allergy   Past Medical History:  Past Medical History:  Diagnosis Date  . Arthritis   . Diabetes mellitus type 2 in obese ([place])   . Full dentures   . GERD (gastroesophageal reflux disease)   . Hyperlipidemia   . Lung cancer ([place])   . Metastatic cancer to brain ([place])   . Pneumonia   . Wears glasses    Past Surgical History:  Past Surgical History:  Procedure Laterality Date  . APPLICATION OF CRANIAL NAVIGATION N/A 04/19/2020   Procedure: APPLICATION OF CRANIAL NAVIGATION;  Surgeon: Kary Kos, MD;  Location: [facility];  Service: Neurosurgery;  Laterality: N/A;  APPLICATION OF CRANIAL NAVIGATION  . COLONOSCOPY W/ BIOPSIES AND POLYPECTOMY    . CRANIOTOMY Right 04/19/2020   Procedure: Right Temporal stereotactic craniotomy;  Surgeon: Kary Kos, MD;  Location: [facility];  Service: Neurosurgery;  Laterality: Right;  . FRACTURE SURGERY     right shoulder  . MULTIPLE TOOTH EXTRACTIONS     Social History:  Social History   Socioeconomic History  . Marital status: Married    Spouse name: Not on file  . Number of children: Not on file  . Years of education: Not on file  . Highest education level: Not on file  Occupational History  . Not on file  Tobacco Use  . Smoking status: Former Smoker    Types: Cigarettes  . Smokeless tobacco: Never Used  . Tobacco comment: quit smoking in 1972  Vaping Use  . Vaping Use: Never used  Substance and Sexual Activity  . Alcohol use: Not Currently    Comment: not since 1972  . Drug use: Never  . Sexual  activity: Not on file  Other Topics Concern  . Not on file  Social History Narrative  . Not on file   Social Determinants of Health   Financial Resource Strain: Not on file  Food Insecurity: Not on file  Transportation Needs: Not on file  Physical Activity: Not on file  Stress: Not on file  Social Connections: Not on file  Intimate Partner Violence: Not on file   Family History: No family history on file.  Review of Systems: Constitutional: Doesn't report fevers, chills or abnormal weight loss Eyes: Doesn't report blurriness of vision Ears, nose, mouth, throat, and face: Doesn't report sore throat Respiratory: Doesn't report cough, dyspnea or wheezes Cardiovascular: Doesn't report palpitation, chest discomfort  Gastrointestinal:  Doesn't report nausea, constipation, diarrhea GU: Doesn't report incontinence Skin: Doesn't report skin rashes Neurological: Per HPI Musculoskeletal: Doesn't report joint pain Behavioral/Psych: Doesn't report anxiety  Physical Exam: Vitals:   11/28/20 0906  BP: (!) 148/81  Pulse: (!) 59  Resp: 18  Temp: (!) 96.5 F (35.8 C)  SpO2: 99%   KPS: 90. General: Alert, cooperative, pleasant, in no acute distress Head: Normal EENT: No conjunctival injection or scleral icterus.  Lungs: Resp effort normal Cardiac: Regular rate Abdomen: Non-distended abdomen Skin: No rashes cyanosis or petechiae. Extremities: No clubbing or edema  Neurologic Exam: Mental Status: Awake, alert, attentive to examiner. Oriented to self and environment. Language is fluent with intact comprehension.  Cranial Nerves: Visual acuity is grossly normal. Visual fields are full. Extra-ocular movements intact. No ptosis. Face is symmetric Motor: Tone and bulk are normal. Power is full in both arms and legs. Reflexes are symmetric, no pathologic reflexes present.  Sensory: Intact to light touch Gait: Normal.   Labs: I have reviewed the data as listed    Component Value  Date/Time   NA 140 11/05/2020 0000   K 4.0 11/05/2020 0000   CL 104 11/05/2020 0000   CO2 28 (A) 11/05/2020 0000   GLUCOSE 133 (H) 10/08/2020 0919   BUN 11 11/05/2020 0000   CREATININE 1.1 11/05/2020 0000   CREATININE 1.38 (H) 10/08/2020 0919   CALCIUM 9.2 11/05/2020 0000   PROT 6.6 10/08/2020 0919   ALBUMIN 3.6 11/05/2020 0000   AST 23 11/05/2020 0000   AST 23 10/08/2020 0919   ALT 21 11/05/2020 0000   ALT 26 10/08/2020 0919   ALKPHOS 63 11/05/2020 0000   BILITOT 0.7 10/08/2020 0919   GFRNONAA 53 (L) 10/08/2020 0919   GFRAA >60 07/23/2020 1253   Lab Results  Component Value Date   WBC 6.3 11/05/2020   NEUTROABS 3.97 11/05/2020   HGB 15.5 11/05/2020   HCT 47 11/05/2020   MCV 89 11/05/2020   PLT 179 11/05/2020    Imaging:  MR Brain W Wo Contrast  Result Date: 11/21/2020 CLINICAL DATA:  Brain/CNS  neoplasm. Follow-up of treated metastatic disease to the brain. EXAM: MRI HEAD WITHOUT AND WITH CONTRAST TECHNIQUE: Multiplanar, multiecho pulse sequences of the brain and surrounding structures were obtained without and with intravenous contrast. CONTRAST:  64mL GADAVIST GADOBUTROL 1 MMOL/ML IV SOLN COMPARISON:  MRI of the brain July 19, 2020. FINDINGS: Brain: Interval increase in thickness and contrast enhancement of the dura subjacent to the right temporal craniotomy. There is also increase of the irregular contrast enhancement within the surgical cavity in the right temporal lobe with increased surrounding parenchymal edema, concerning for recurrent disease. The area of contrast enhancement measures approximately 2.6 x 1.8 x 1.4 cm. 3 mm right parietal lesion now show solid enhancement. No new focus of abnormal contrast enhancement identified. No hydrocephalus, hemorrhage or acute infarct. Mild-to-moderate chronic white matter ischemic changes. Vascular: Normal flow voids. Skull and upper cervical spine: Postsurgical changes from right temporal craniotomy. No focal marrow lesion  identified. Sinuses/Orbits: Negative. IMPRESSION: 1. Interval increase in thickness and contrast enhancement of the dura subjacent to the right temporal craniotomy with increased irregular contrast enhancement within the surgical cavity in the right temporal lobe with increased surrounding parenchymal edema. Findings are concerning for recurrent disease. 2. 3 mm right parietal metastatic lesion now show solid enhancement. No new focus of abnormal contrast enhancement identified. Electronically Signed   By: Pedro Earls M.D.   On: 11/21/2020 21:16    [facility] Clinician Interpretation: I have personally reviewed the radiological images as listed.  My interpretation, in the context of the patient's clinical presentation, is treatment effect vs true progression   Assessment/Plan Brain metastasis [facility]) [C79.31]  Borden Thune is clinically stable today.  MRI demonstrates progression of disease at pre-op SRS and craniotomy site, which we suspect is secondary to radio-inflammatory process, vs organic progression of disease.  Case was reviewed in detail at brain/spine tumor board meeting this week.    Small focus of enhancement in right subcortex is slightly larger likely 2/2 recent radiosurgery.  Today we recommended close monitoring of these changes with repeat brain MRI in 2 months.  He should remain off decadron in the interim.  If neurologic symptoms develop he will let us know.  We appreciate the opportunity to participate in the care of Alex Cordova.  He should return to clinic in 2 months with MRI brain for review.  All questions were answered. The patient knows to call the clinic with any problems, questions or concerns. No barriers to learning were detected.  I have spent a total of 40 minutes of face-to-face and non-face-to-face time, excluding clinical staff time, preparing to see patient, ordering tests and/or medications, counseling the patient, and  independently interpreting results and communicating results to the patient/family/caregiver    Ventura Sellers, MD Medical Director of Neuro-Oncology [facility] [place] 11/28/20 9:06 AM

## 2020-12-05 ENCOUNTER — Telehealth: Payer: Self-pay | Admitting: Internal Medicine

## 2020-12-05 NOTE — Telephone Encounter (Signed)
Scheduled per 12/23 los, patient has been called and notified.

## 2020-12-10 ENCOUNTER — Other Ambulatory Visit: Payer: Self-pay | Admitting: Radiation Therapy

## 2020-12-17 ENCOUNTER — Inpatient Hospital Stay: Payer: No Typology Code available for payment source | Admitting: Oncology

## 2020-12-31 ENCOUNTER — Telehealth: Payer: Self-pay | Admitting: Oncology

## 2020-12-31 NOTE — Telephone Encounter (Signed)
12/31/20 spoke with patient and sched ct scans on 01/06/21@1030am 

## 2021-01-03 ENCOUNTER — Telehealth: Payer: Self-pay | Admitting: Radiation Therapy

## 2021-01-03 ENCOUNTER — Encounter: Payer: Self-pay | Admitting: Oncology

## 2021-01-03 NOTE — Telephone Encounter (Signed)
Left a detailed message on the home phone voicemail and spoke directly with Mrs. Renstrom about her husband's upcoming brain MRI and follow-up visit with Dr. Mickeal Skinner.   Mont Dutton R.T.(R)(T) Radiation Special Procedures Navigator

## 2021-01-06 ENCOUNTER — Encounter: Payer: Self-pay | Admitting: Oncology

## 2021-01-07 ENCOUNTER — Telehealth: Payer: Self-pay | Admitting: Oncology

## 2021-01-07 ENCOUNTER — Other Ambulatory Visit: Payer: Self-pay | Admitting: Oncology

## 2021-01-07 ENCOUNTER — Inpatient Hospital Stay: Payer: No Typology Code available for payment source | Attending: Internal Medicine | Admitting: Oncology

## 2021-01-07 VITALS — BP 161/84 | HR 60 | Temp 98.1°F | Resp 18 | Ht 70.5 in | Wt 241.0 lb

## 2021-01-07 DIAGNOSIS — C3412 Malignant neoplasm of upper lobe, left bronchus or lung: Secondary | ICD-10-CM

## 2021-01-07 NOTE — Telephone Encounter (Signed)
Per 2/1 LOS,patient scheduled for April Appt's.  Gave patient Appt Summary

## 2021-01-07 NOTE — Telephone Encounter (Signed)
Per 2/1 LOS, patient scheduled for April Appt's.  Gave patient Appt Summary

## 2021-01-07 NOTE — Progress Notes (Signed)
Alex Cordova  [address] [place],  [place]  [address] [phone]  Clinic Day:  01/07/2021  Referring physician: Caralyn Guile, DO   HISTORY OF PRESENT ILLNESS:  The patient is a 75 y.o. male with metastatic non-small cell lung cancer, which includes brain metastasis and a metastatic focus of disease to his left acromion process.  His initial lung cancer pathology showed squamous cell carcinoma.  However, pathology from his brain resection done earlier this year showed adenocarcinoma. 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  He comes in today to go over his CT scans to ascertain his new disease baseline.  Since his last visit, he has been doing fairly well.  He denies having any significant side effects with his targeted oral therapy.  He  occasionally has headaches and drifts with his balance before correcting himself.  His wife brings to my attention that he is scheduled for another brain MRI later this month.  PHYSICAL EXAM:  Blood pressure (!) 161/84, pulse 60, temperature 98.1 F (36.7 C), resp. rate 18, height 5' 10.5" (1.791 m), weight 241 lb (109.3 kg), SpO2 96 %. Wt Readings from Last 3 Encounters:  01/07/21 241 lb (109.3 kg)  11/28/20 234 lb 3.2 oz (106.2 kg)  11/05/20 227 lb 3.2 oz (103.1 kg)   Body mass index is 34.09 kg/m. Performance statutus: 1 Physical Exam Constitutional:      Appearance: Normal appearance. He is not ill-appearing.  HENT:     Mouth/Throat:     Mouth: Mucous membranes are moist.     Pharynx: Oropharynx is clear. No oropharyngeal exudate or posterior oropharyngeal erythema.  Cardiovascular:     Rate and Rhythm: Normal rate and regular rhythm.     Heart sounds: No murmur heard. No friction rub. No gallop.   Pulmonary:     Effort: Pulmonary effort is normal. No respiratory distress.     Breath sounds:  Normal breath sounds. No wheezing, rhonchi or rales.  Chest:  Breasts:     Right: No axillary adenopathy or supraclavicular adenopathy.     Left: No axillary adenopathy or supraclavicular adenopathy.    Abdominal:     General: Bowel sounds are normal. There is no distension.     Palpations: Abdomen is soft. There is no mass.     Tenderness: There is no abdominal tenderness.  Musculoskeletal:        General: No swelling.     Right lower leg: No edema.     Left lower leg: No edema.  Lymphadenopathy:     Cervical: No cervical adenopathy.     Upper Body:     Right upper body: No supraclavicular or axillary adenopathy.     Left upper body: No supraclavicular or axillary adenopathy.     Lower Body: No right inguinal adenopathy. No left inguinal adenopathy.  Skin:    General: Skin is warm.     Coloration: Skin is not jaundiced.     Findings: No lesion or rash.  Neurological:     General: No focal deficit present.     Mental Status: He is alert and oriented to person, place, and time. Mental status is at baseline.     Cranial Nerves: Cranial nerves are intact.  Psychiatric:        Mood and Affect: Mood normal.  Behavior: Behavior normal.        Thought Content: Thought content normal.   SCANS:  CT scans of his chest/abdomen/pelvis revealed the following: FINDINGS: CT CHEST FINDINGS  Cardiovascular: Accessed right chest wall port with tip at the superior cavoatrial junction. Aortic and branch vessel atherosclerosis. Normal size heart. No pericardial effusion. Lipomatous hypertrophy of the intra-atrial septum. Coronary artery calcifications. No central pulmonary embolus.  Mediastinum/Nodes: No mediastinal, hilar or axillary adenopathy.  Lungs/Pleura: The decreased size of the irregular posterior left upper lobe pulmonary nodule which measures 1.4 x 0.9 cm previously 1.4 x 1.3 cm (series 4, image 45). Mild centrilobular and paraseptal emphysema. No new suspicious pulmonary  nodules or masses. No pleural effusion. No pneumothorax.  Musculoskeletal: No suspicious lytic or blastic lesion of bone. Similar 1.2 cm probable sebaceous cyst along the posterior midline chest wall.  CT ABDOMEN PELVIS FINDINGS  Hepatobiliary: Hepatic steatosis. No suspicious hepatic lesion. Gallbladder is unremarkable. No biliary ductal dilatation.  Pancreas: Unremarkable. No pancreatic ductal dilatation or surrounding inflammatory changes.  Spleen: Normal in size without focal abnormality.  Adrenals/Urinary Tract: Adrenal glands are unremarkable. Kidneys are normal, without renal calculi, focal lesion, or hydronephrosis. No collecting system filling defect on delayed imaging. Bladder is predominantly decompressed without visualized suspicious wall thickening..  Stomach/Bowel: Stomach is predominantly decompressed limiting evaluation. Duodenal diverticulum. Appropriate positioning of the duodenum and ligament of Treitz. No abnormal small bowel dilation or wall thickening. Normal appendix. Unremarkable appearance of the terminal ileum. Colonic diverticulosis without findings of diverticulitis.  Vascular/Lymphatic: Aortic atherosclerosis. No enlarged abdominal or pelvic lymph nodes.  Reproductive: Prostate is unremarkable.  Other: No abdominopelvic ascites. No pneumoperitoneum. No abdominal wall hernia.  Musculoskeletal: Mild multilevel degenerative changes spine. Left iliac bone island. No suspicious lytic or blastic lesion of bone.  IMPRESSION: 1. Interval decrease in size of the irregular posterior left upper lobe pulmonary nodule. 2. No evidence of new metastatic disease within the chest, abdomen, or pelvis. 3. Hepatic steatosis. 4. Colonic diverticulosis without findings of diverticulitis. 5. Emphysema and aortic atherosclerosis.  Aortic Atherosclerosis (ICD10-I70.0) and Emphysema (ICD10-J43.9).  ASSESSMENT & PLAN:  Assessment/Plan:  A 75 y.o. 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  His initial biopsy revealed squamous cell carcinoma, but his metastatic temporal lobe lesion came back showing adenocarcinoma. In clinic today, I went over his CT scan images with him, for which he could see that his disease remains under ideal control from the neck down.  Included in this is the left lung lesion, which is smaller than in size.  His wife brings to my attention that he is scheduled for a brain MRI in the forthcoming weeks in [place].  For now, he will continue to take capmatinib on a daily basis.  I will see him back in 2 months for repeat clinical assessment.  The patient understands all the plans discussed today and is in agreement with them.      Madysun Thall Macarthur Critchley, MD

## 2021-01-08 ENCOUNTER — Telehealth: Payer: Self-pay | Admitting: *Deleted

## 2021-01-08 NOTE — Telephone Encounter (Signed)
Pt received pfizer 1st dose in jan 2021 and 2nd dose in feb 2021 through [facility]. Pt will get booster near him

## 2021-01-24 ENCOUNTER — Ambulatory Visit (HOSPITAL_COMMUNITY): Admission: RE | Admit: 2021-01-24 | Payer: PPO | Source: Ambulatory Visit

## 2021-01-28 ENCOUNTER — Ambulatory Visit: Payer: No Typology Code available for payment source | Admitting: Internal Medicine

## 2021-02-05 ENCOUNTER — Other Ambulatory Visit: Payer: Self-pay

## 2021-02-05 ENCOUNTER — Ambulatory Visit (HOSPITAL_COMMUNITY)
Admission: RE | Admit: 2021-02-05 | Discharge: 2021-02-05 | Disposition: A | Discharge status: Home / Self Care | Payer: No Typology Code available for payment source | Source: Ambulatory Visit | Attending: Internal Medicine | Admitting: Internal Medicine

## 2021-02-05 DIAGNOSIS — C7931 Secondary malignant neoplasm of brain: Secondary | ICD-10-CM | POA: Insufficient documentation

## 2021-02-05 IMAGING — MR MR HEAD WO/W CM
10 of 14 series · 22 of 48 positions shown · IV contrast (gadavist)
Comparison: MRI head [DATE], [DATE]

CLINICAL DATA: Metastatic lung cancer.  Assess treatment response.

EXAM:
MRI HEAD WITHOUT AND WITH CONTRAST
TECHNIQUE: Multiplanar, multiecho pulse sequences of the brain and surrounding
structures were obtained without and with intravenous contrast.
CONTRAST:  10mL GADAVIST GADOBUTROL 1 MMOL/ML IV SOLN

[Series 2: FLAIR · sagittal · 3.0mm · 0.47mm/px · 2 of 38 slices shown (1 of 2)]
[im 1/38]
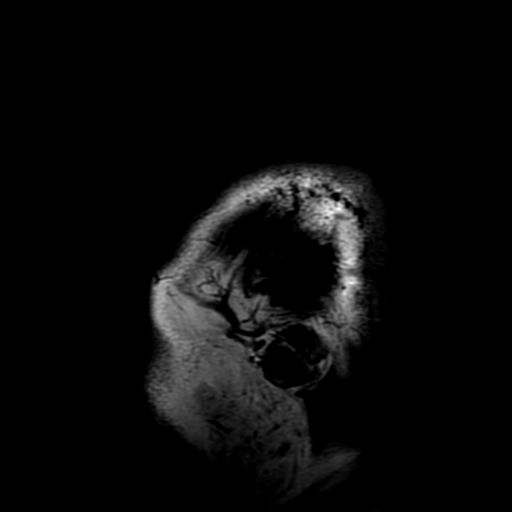
[im 38/38]
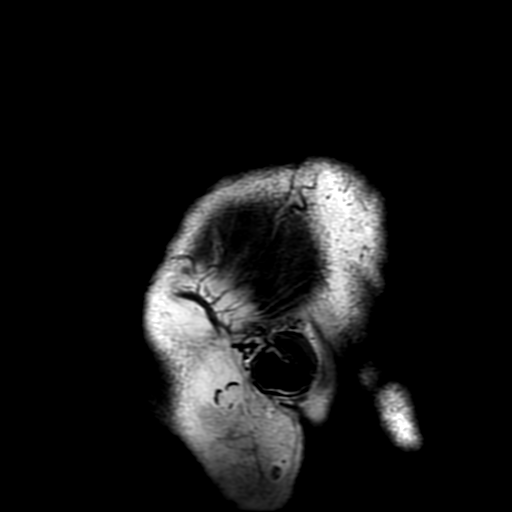

[Series 3: DWI · axial · 3.0mm · 0.94mm/px · z∈[-76,+94]mm · 4 of 116 slices shown]
[im 1/116]
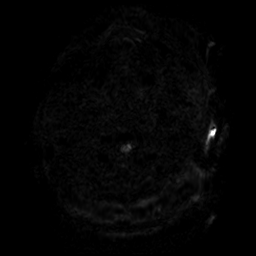
[im 39/116]
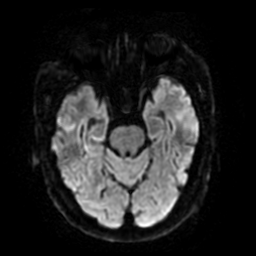
[im 77/116]
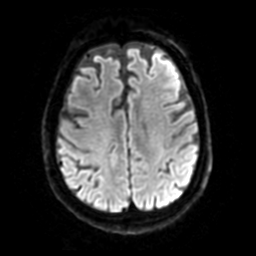
[im 116/116]
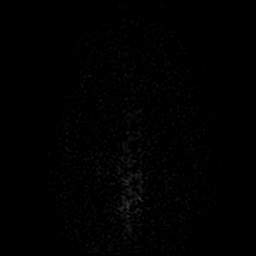

[Series 4: FLAIR · axial · 3.0mm · 0.47mm/px · 1 of 58 slices shown (2 of 2)]
[im 1/58]
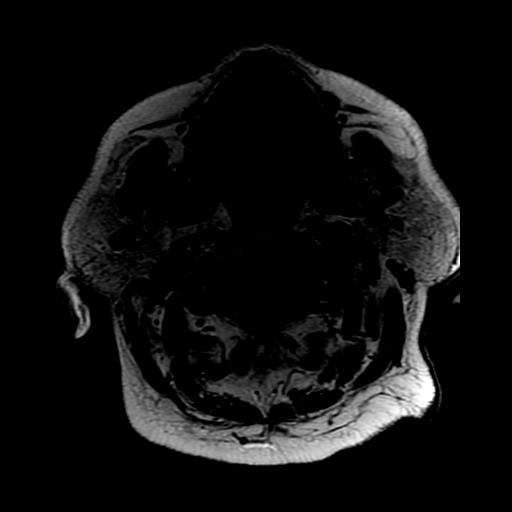

[Series 5: SWI · axial · 3.0mm · 0.47mm/px · z∈[-77,+95]mm · 3 of 116 slices shown]
[im 1/116]
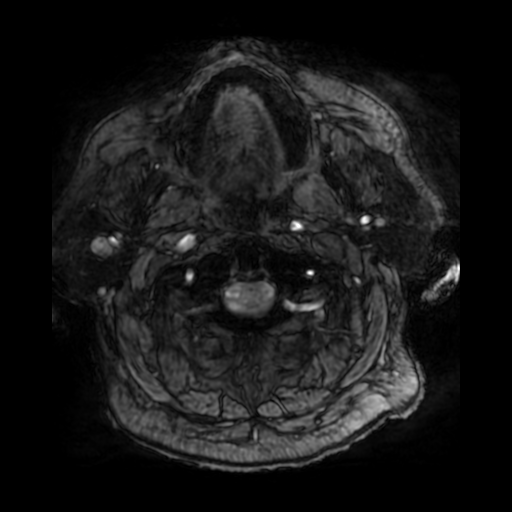
[im 58/116]
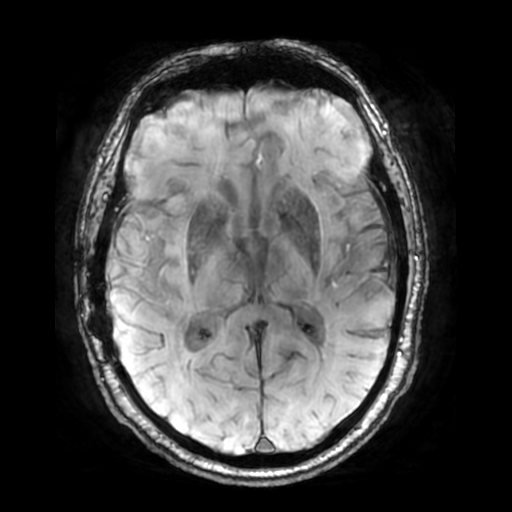
[im 116/116]
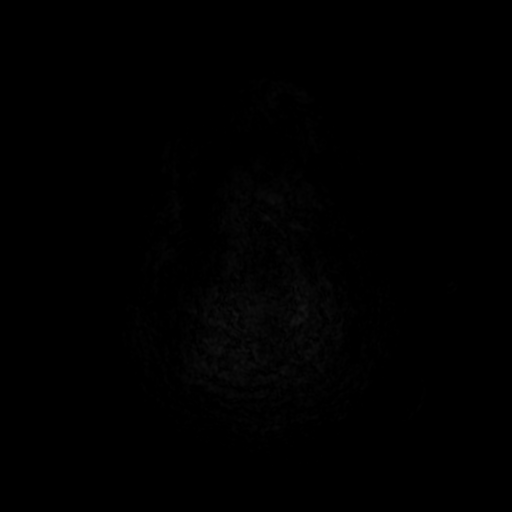

[Series 7: T2 post-contrast · coronal · 3.0mm · 0.39mm/px · 1 of 53 slices shown (1 of 2)]
[im 1/53]
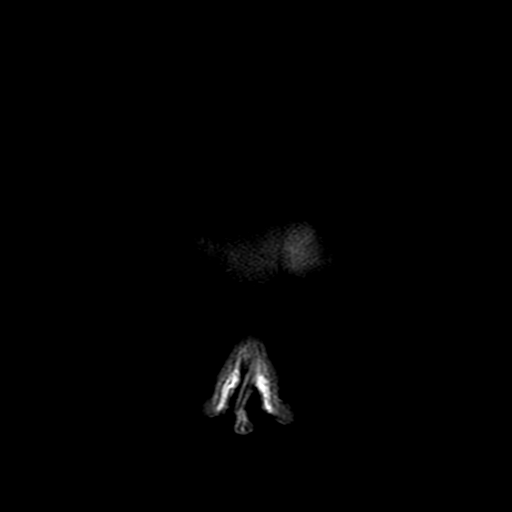

[Series 8: T2 post-contrast · axial · 5.0mm · 0.47mm/px · 1 of 29 slices shown (2 of 2)]
[im 1/29]
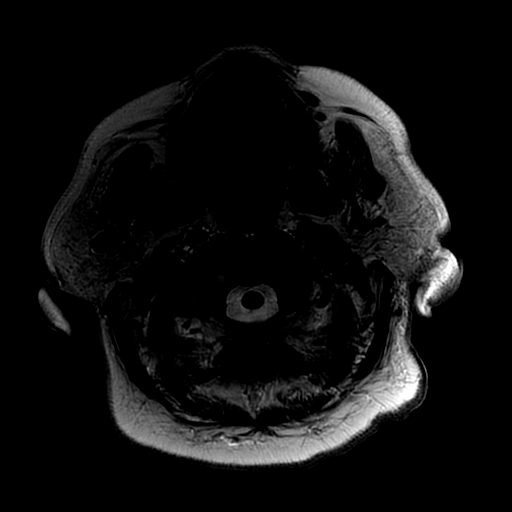

[Series 9: T1 post-contrast · coronal · 3.0mm · 0.39mm/px · 1 of 52 slices shown (1 of 2)]
[im 1/52]
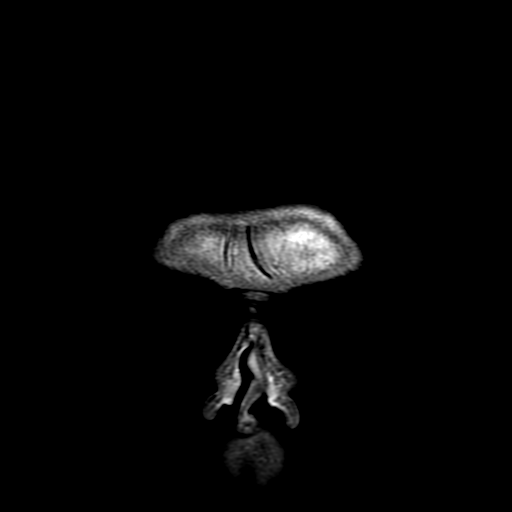

[Series 10: FLAIR post-contrast · sagittal · 3.0mm · 0.47mm/px · 1 of 38 slices shown]
[im 1/38]
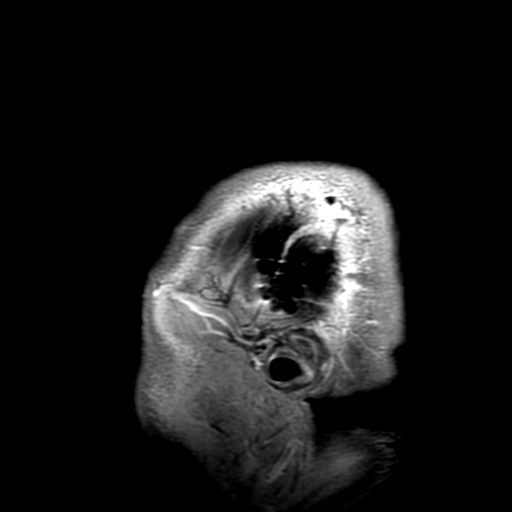

[Series 350: ADC · axial · 3.0mm · 0.94mm/px · 1 of 58 slices shown]
[im 1/58]
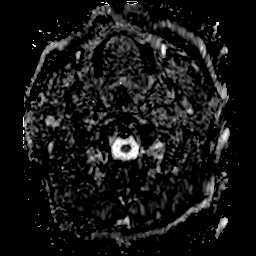

[Series 1100: T1 post-contrast · axial · 0.9mm · 0.50mm/px · z∈[-130,+123]mm · 7 of 301 slices shown (2 of 2)]
[im 1/301]
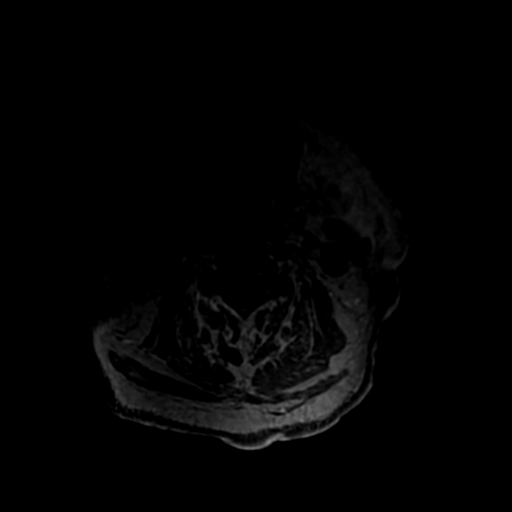
[im 51/301]
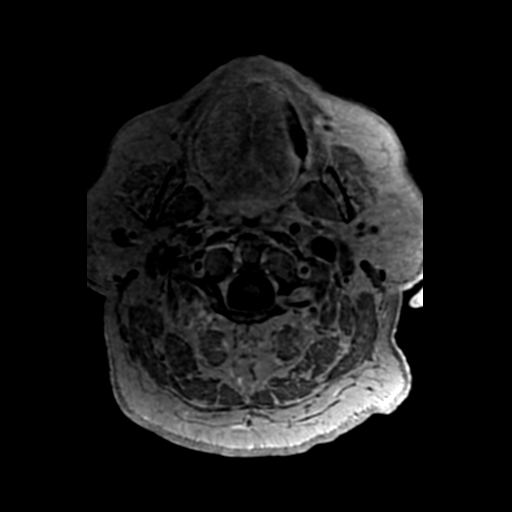
[im 101/301]
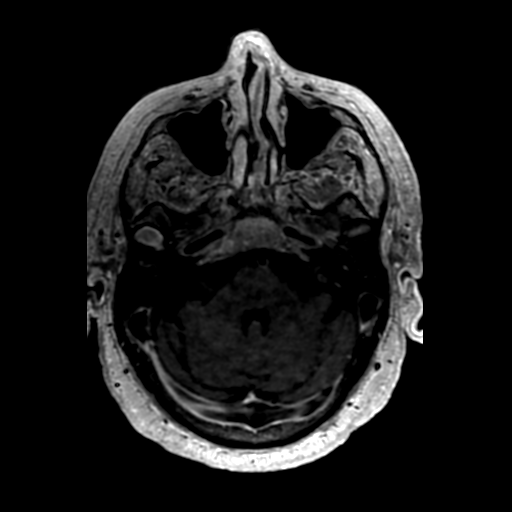
[im 151/301]
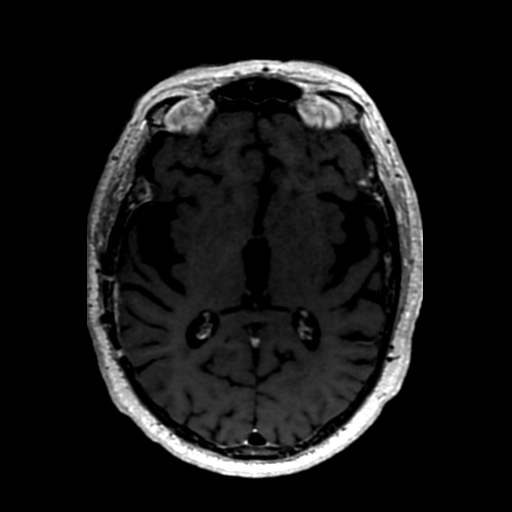
[im 201/301]
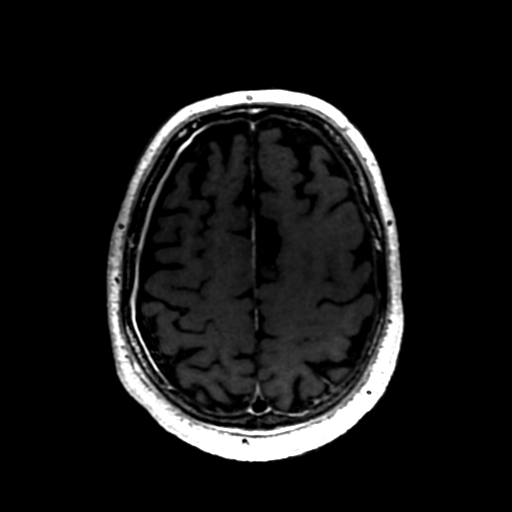
[im 251/301]
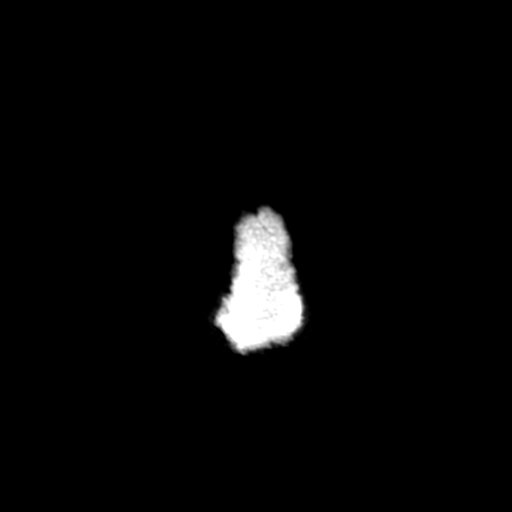
[im 301/301]
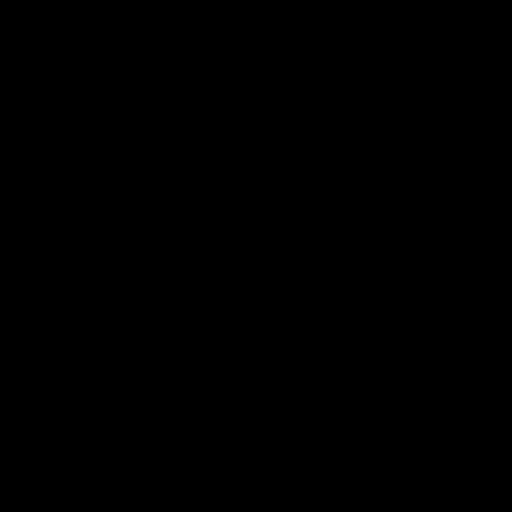

[22 of 48 positions shown; findings below may reference images not displayed]

FINDINGS: Brain: Right temporal craniotomy for tumor resection. Decreased
FLAIR edema in the right temporal lobe. Patchy enhancing area in the
right lateral temporal lobe slightly improved, measuring
approximately 2 cm in diameter.

3 mm enhancing lesion right parietal subcortical white matter region
is unchanged. No new lesions identified compared to the most recent
study.

There is dural thickening and enhancement overlying the right
cerebral hemisphere which has developed since prior studies. No
nodularity. This may be due to prior craniotomy however it is
unusual this developed at this late stage. Normal dural enhancement
on the left.

Generalized atrophy, moderate. Mild white matter changes, chronic.
No acute infarct or hemorrhage.

Vascular: Normal arterial flow voids

Skull and upper cervical spine: Right temporal craniotomy. No focal
skeletal metastasis.

Sinuses/Orbits: Negative

Other: None
IMPRESSION: Enhancing lesion at the biopsy site in the right temporal lobe
appears slightly smaller. Decreased surrounding FLAIR edema. Likely
treatment effect.

Interval development of dural thickening enhancement overlying the
right cerebral hemisphere uncertain etiology. Possible postsurgical
however close follow-up warranted.

3 mm enhancing metastasis right parietal lobe stable. No new
lesions.

## 2021-02-05 MED ORDER — GADOBUTROL 1 MMOL/ML IV SOLN
10.0000 mL | Freq: Once | INTRAVENOUS | Status: AC | PRN
  Administered 2021-02-05: 10 mL via INTRAVENOUS

## 2021-02-07 ENCOUNTER — Inpatient Hospital Stay (INDEPENDENT_AMBULATORY_CARE_PROVIDER_SITE_OTHER): Payer: No Typology Code available for payment source | Admitting: Hematology and Oncology

## 2021-02-07 ENCOUNTER — Inpatient Hospital Stay: Payer: No Typology Code available for payment source | Attending: Internal Medicine | Admitting: Internal Medicine

## 2021-02-07 ENCOUNTER — Other Ambulatory Visit: Payer: Self-pay | Admitting: Hematology and Oncology

## 2021-02-07 ENCOUNTER — Inpatient Hospital Stay: Payer: No Typology Code available for payment source

## 2021-02-07 ENCOUNTER — Other Ambulatory Visit: Payer: Self-pay

## 2021-02-07 ENCOUNTER — Telehealth: Payer: Self-pay

## 2021-02-07 VITALS — BP 169/80 | HR 57 | Temp 97.8°F | Resp 16 | Wt 238.5 lb

## 2021-02-07 VITALS — BP 144/82 | HR 56 | Temp 97.5°F | Resp 18 | Ht 70.5 in | Wt 238.7 lb

## 2021-02-07 DIAGNOSIS — Z79899 Other long term (current) drug therapy: Secondary | ICD-10-CM | POA: Diagnosis not present

## 2021-02-07 DIAGNOSIS — E785 Hyperlipidemia, unspecified: Secondary | ICD-10-CM | POA: Insufficient documentation

## 2021-02-07 DIAGNOSIS — C3412 Malignant neoplasm of upper lobe, left bronchus or lung: Secondary | ICD-10-CM

## 2021-02-07 DIAGNOSIS — Z7952 Long term (current) use of systemic steroids: Secondary | ICD-10-CM | POA: Diagnosis not present

## 2021-02-07 DIAGNOSIS — C3490 Malignant neoplasm of unspecified part of unspecified bronchus or lung: Secondary | ICD-10-CM | POA: Insufficient documentation

## 2021-02-07 DIAGNOSIS — C7931 Secondary malignant neoplasm of brain: Secondary | ICD-10-CM | POA: Insufficient documentation

## 2021-02-07 DIAGNOSIS — E119 Type 2 diabetes mellitus without complications: Secondary | ICD-10-CM | POA: Insufficient documentation

## 2021-02-07 DIAGNOSIS — Z87891 Personal history of nicotine dependence: Secondary | ICD-10-CM | POA: Diagnosis not present

## 2021-02-07 LAB — CBC AND DIFFERENTIAL
HCT: 43 (ref 41–53)
Hemoglobin: 15 (ref 13.5–17.5)
Neutrophils Absolute: 4.29
Platelets: 158 (ref 150–399)
WBC: 6.5

## 2021-02-07 LAB — CBC: RBC: 4.96 (ref 3.87–5.11)

## 2021-02-07 LAB — HEPATIC FUNCTION PANEL
ALT: 21 (ref 10–40)
AST: 9 — AB (ref 14–40)
Alkaline Phosphatase: 54 (ref 25–125)
Bilirubin, Total: 0.6

## 2021-02-07 LAB — BASIC METABOLIC PANEL
BUN: 11 (ref 4–21)
CO2: 23 — AB (ref 13–22)
Chloride: 106 (ref 99–108)
Creatinine: 1.1 (ref 0.6–1.3)
Glucose: 101
Potassium: 4.1 (ref 3.4–5.3)
Sodium: 136 — AB (ref 137–147)

## 2021-02-07 LAB — COMPREHENSIVE METABOLIC PANEL
Albumin: 3.4 — AB (ref 3.5–5.0)
Calcium: 8.4 — AB (ref 8.7–10.7)

## 2021-02-07 MED ORDER — FUROSEMIDE 20 MG PO TABS: 20.0000 mg | ORAL_TABLET | Freq: Every day | ORAL | 0 refills | Status: DC | PRN

## 2021-02-07 NOTE — Telephone Encounter (Signed)
Pt had LVM on triage line stating needed to make an appt with Dr Bobby Rumpf. His legs, arms and hands are swollen.    I called pt, & spoke with his wife,Ameryls. The swelling has been occurring for the last 2 weeks. He really doesn't feel good. I will pass this information to Melissa,NP.

## 2021-02-07 NOTE — Progress Notes (Signed)
[facility] [place], [place] [address] [phone]   Interval Evaluation  Date of Service: 02/07/21 Patient Name: Alex Cordova Patient MRN: [id number] Patient DOB: 1946-11-03 Provider: Ventura Sellers, MD  Identifying Statement:  Alex Cordova is a 75 y.o. male with Brain metastasis [facility]) [C79.31]   Primary Cancer: [place] Lung Stage IV  Oncologic History: 04/18/20: Pre-op SRS R temporal 14 Gy 04/19/20: Craniotomy, resection by Dr. Saintclair Halsted 07/30/20: Salvage SRS R subcortex  Interval History:  Alex Cordova presents today for follow up after recent MRI brain.  He denies new or progressive deficits today.  No further issues with his left side. No seizures or headaches.  Continues on Capmatinib as prior.  H+P (07/23/20) Patient presents to clinic today for follow up after 3 months post-SRS/craniotomy MRI scan.  He describes no new or progressive neurologic deficits.  No headaches or seizures.  He no longer takes Keppra and he has recently completed tapering off all corticosteroids.  He remains relatively active around the home, independent with gait and activities of daily living.  Currently on observation and serial imaging monitoring with Dr. Bobby Rumpf for lung cancer.   Medications: Current Outpatient Medications on File Prior to Visit  Medication Sig Dispense Refill  . acetaminophen (TYLENOL) 325 MG tablet Take 650 mg by mouth every 6 (six) hours as needed.    . Alogliptin Benzoate 25 MG TABS Take 0.5 tablets by mouth in the morning and at bedtime. 60 tablet 0  . calcium-vitamin D (OSCAL WITH D) 500-200 MG-UNIT tablet Take 1 tablet by mouth 2 (two) times daily.    . capmatinib (TABRECTA) 200 MG tablet Take 400 mg by mouth 2 (two) times daily.    Marland Kitchen dexamethasone (DECADRON) 4 MG tablet Take 4 mg by mouth 3 (three) times daily. (Patient not taking: Reported on 11/28/2020)    . esomeprazole (NEXIUM) 40 MG capsule  Take 40 mg by mouth daily at 12 noon.    Marland Kitchen ibuprofen (ADVIL) 200 MG tablet Take 200 mg by mouth every 6 (six) hours as needed.    Marland Kitchen LORazepam (ATIVAN) 0.5 MG tablet 1 tab po 30 minutes prior to radiation or MRI 10 tablet 0  . Psyllium (METAMUCIL PO) Take 1 Scoop by mouth daily.     No current facility-administered medications on file prior to visit.    Allergies:  Allergies  Allergen Reactions  . Sulfamethoxazole Nausea Only    Childhood allergy   Past Medical History:  Past Medical History:  Diagnosis Date  . Arthritis   . Diabetes mellitus type 2 in obese ([place])   . Full dentures   . GERD (gastroesophageal reflux disease)   . Hyperlipidemia   . Lung cancer ([place])   . Metastatic cancer to brain ([place])   . Pneumonia   . Wears glasses    Past Surgical History:  Past Surgical History:  Procedure Laterality Date  . APPLICATION OF CRANIAL NAVIGATION N/A 04/19/2020   Procedure: APPLICATION OF CRANIAL NAVIGATION;  Surgeon: Kary Kos, MD;  Location: [place];  Service: Neurosurgery;  Laterality: N/A;  APPLICATION OF CRANIAL NAVIGATION  . COLONOSCOPY W/ BIOPSIES AND POLYPECTOMY    . CRANIOTOMY Right 04/19/2020   Procedure: Right Temporal stereotactic craniotomy;  Surgeon: Kary Kos, MD;  Location: [facility];  Service: Neurosurgery;  Laterality: Right;  . FRACTURE SURGERY     right shoulder  . MULTIPLE TOOTH EXTRACTIONS     Social History:  Social History   Socioeconomic History  . Marital status: Married    Spouse name: Not on file  . Number of children: Not on file  . Years of education: Not on file  . Highest education level: Not on file  Occupational History  . Not on file  Tobacco Use  . Smoking status: Former Smoker    Types: Cigarettes  . Smokeless tobacco: Never Used  . Tobacco comment: quit smoking in 1972  Vaping Use  . Vaping Use: Never used  Substance and Sexual Activity  . Alcohol use: Not Currently    Comment: not since 1972  . Drug use: Never  . Sexual  activity: Not on file  Other Topics Concern  . Not on file  Social History Narrative  . Not on file   Social Determinants of Health   Financial Resource Strain: Not on file  Food Insecurity: Not on file  Transportation Needs: Not on file  Physical Activity: Not on file  Stress: Not on file  Social Connections: Not on file  Intimate Partner Violence: Not on file   Family History: No family history on file.  Review of Systems: Constitutional: Doesn't report fevers, chills or abnormal weight loss Eyes: Doesn't report blurriness of vision Ears, nose, mouth, throat, and face: Doesn't report sore throat Respiratory: Doesn't report cough, dyspnea or wheezes Cardiovascular: Doesn't report palpitation, chest discomfort  Gastrointestinal:  Doesn't report nausea, constipation, diarrhea GU: Doesn't report incontinence Skin: Doesn't report skin rashes Neurological: Per HPI Musculoskeletal: Doesn't report joint pain Behavioral/Psych: Doesn't report anxiety  Physical Exam: Vitals:   02/07/21 0906  BP: (!) 144/82  Pulse: (!) 56  Resp: 18  Temp: (!) 97.5 F (36.4 C)  SpO2: 98%   KPS: 90. General: Alert, cooperative, pleasant, in no acute distress Head: Normal EENT: No conjunctival injection or scleral icterus.  Lungs: Resp effort normal Cardiac: Regular rate Abdomen: Non-distended abdomen Skin: No rashes cyanosis or petechiae. Extremities: No clubbing or edema  Neurologic Exam: Mental Status: Awake, alert, attentive to examiner. Oriented to self and environment. Language is fluent with intact comprehension.  Cranial Nerves: Visual acuity is grossly normal. Visual fields are full. Extra-ocular movements intact. No ptosis. Face is symmetric Motor: Tone and bulk are normal. Power is full in both arms and legs. Reflexes are symmetric, no pathologic reflexes present.  Sensory: Intact to light touch Gait: Normal.   Labs: I have reviewed the data as listed    Component Value  Date/Time   NA 140 11/05/2020 0000   K 4.0 11/05/2020 0000   CL 104 11/05/2020 0000   CO2 28 (A) 11/05/2020 0000   GLUCOSE 133 (H) 10/08/2020 0919   BUN 11 11/05/2020 0000   CREATININE 1.1 11/05/2020 0000   CREATININE 1.38 (H) 10/08/2020 0919   CALCIUM 9.2 11/05/2020 0000   PROT 6.6 10/08/2020 0919   ALBUMIN 3.6 11/05/2020 0000   AST 23 11/05/2020 0000   AST 23 10/08/2020 0919   ALT 21 11/05/2020 0000   ALT 26 10/08/2020 0919   ALKPHOS 63 11/05/2020 0000   BILITOT 0.7 10/08/2020 0919   GFRNONAA 53 (L) 10/08/2020 0919   GFRAA >60 07/23/2020 1253   Lab Results  Component Value Date   WBC 6.3 11/05/2020   NEUTROABS 3.97 11/05/2020   HGB 15.5 11/05/2020   HCT 47 11/05/2020   MCV 89 11/05/2020   PLT 179 11/05/2020    Imaging:  MR BRAIN W WO CONTRAST  Result Date: 02/05/2021 CLINICAL DATA:  Metastatic  lung cancer.  Assess treatment response. EXAM: MRI HEAD WITHOUT AND WITH CONTRAST TECHNIQUE: Multiplanar, multiecho pulse sequences of the brain and surrounding structures were obtained without and with intravenous contrast. CONTRAST:  23mL GADAVIST GADOBUTROL 1 MMOL/ML IV SOLN COMPARISON:  MRI head 11/21/2020, 07/19/2020 FINDINGS: Brain: Right temporal craniotomy for tumor resection. Decreased FLAIR edema in the right temporal lobe. Patchy enhancing area in the right lateral temporal lobe slightly improved, measuring approximately 2 cm in diameter. 3 mm enhancing lesion right parietal subcortical white matter region is unchanged. No new lesions identified compared to the most recent study. There is dural thickening and enhancement overlying the right cerebral hemisphere which has developed since prior studies. No nodularity. This may be due to prior craniotomy however it is unusual this developed at this late stage. Normal dural enhancement on the left. Generalized atrophy, moderate. Mild white matter changes, chronic. No acute infarct or hemorrhage. Vascular: Normal arterial flow voids  Skull and upper cervical spine: Right temporal craniotomy. No focal skeletal metastasis. Sinuses/Orbits: Negative Other: None IMPRESSION: Enhancing lesion at the biopsy site in the right temporal lobe appears slightly smaller. Decreased surrounding FLAIR edema. Likely treatment effect. Interval development of dural thickening enhancement overlying the right cerebral hemisphere uncertain etiology. Possible postsurgical however close follow-up warranted. 3 mm enhancing metastasis right parietal lobe stable. No new lesions. Electronically Signed   By: Franchot Gallo M.D.   On: 02/05/2021 16:37    [place] Clinician Interpretation: I have personally reviewed the radiological images as listed.  My interpretation, in the context of the patient's clinical presentation, is treatment effect vs true progression   Assessment/Plan Brain metastasis [facility]) [C79.31]  Alex Cordova is clinically stable today.  MRI show overall regression of treated R parietal focus, but new signal abnormality along much of the dura within the right hemisphere.  Etiology of this change is likely reactive rather than neoplastic infiltration.    Today we recommended close monitoring of these changes with repeat brain MRI in 3 months.  He should remain off decadron in the interim.  If neurologic symptoms develop he will let us know.  We appreciate the opportunity to participate in the care of Alex Cordova.    We ask that Alex Cordova return to clinic in 3 months following next brain MRI, or sooner as needed.  All questions were answered. The patient knows to call the clinic with any problems, questions or concerns. No barriers to learning were detected.  I have spent a total of 30 minutes of face-to-face and non-face-to-face time, excluding clinical staff time, preparing to see patient, ordering tests and/or medications, counseling the patient, and independently interpreting results and communicating results to  the patient/family/caregiver    Ventura Sellers, MD Medical Director of Neuro-Oncology [facility] [place] 02/07/21 9:03 AM

## 2021-02-07 NOTE — Telephone Encounter (Signed)
Pt had LVM on triage line stating needed to make an appt with Dr Bobby Rumpf. His legs, arms and hands are swollen.    I called pt, & spoke with his wife,Ameryles. The swelling has been occurring for the last 2 weeks. He really doesn't feel good. I will pass this information to Melissa,NP.   Melissa,NP, willing to see pt this afternoon. Dr Bobby Rumpf is in agreement. I called them back & gave appt for 130p.

## 2021-02-07 NOTE — Progress Notes (Signed)
[facility]  [address] [place],  [place]  [address] [phone]  Clinic Day:  02/07/2021  Referring physician: Caralyn Guile, DO   CHIEF COMPLAINT:  CC: A 75 year old male with history of metastatic non-small cell lung cancer here for new onset bilateral lower extremity edema  Current Treatment:  Capmatanib   HISTORY OF PRESENT ILLNESS:  Alex Cordova is a 75 y.o. male with a history of metastatic non-small cell lung cancer, which includes brain metastasis and a metastatic focus of disease to his left acromion process. His initial lung cancer pathology showed squamous cell carcinoma. However, pathology from his brain tumor resection done earlier this year showed adenocarcinoma. 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 Recent CT imaging revealed that his disease is under ideal control from the neck down, including the left lung lesion, which is smaller in size. MRI of the brain also revealed stable/ improving lesions in the brain.    INTERVAL HISTORY:  Alex Cordova is here today for routine follow up of his lung cancer. He presents to clinic today for new onset of bilateral lower extremity edema, 2+ today. His wife states she has noticed this over the last couple of weeks. It does improve most mornings. He denies pain, redness or warmth to either leg. He denies shortness of breath, chest pain, or cough. He denies fever, chills, nausea or vomiting. He denies issue with bowel or bladder. He states he has changed his diet as he would like to get his diabetes under better control by losing weight. CBC is unremarkable today. CMP reveals total protein 5.7 and albumin 3.4.   REVIEW OF SYSTEMS:  Review of Systems  Constitutional: Negative for appetite change, chills, diaphoresis, fatigue, fever and unexpected weight change.  HENT:   Negative  for hearing loss, lump/mass, mouth sores, nosebleeds, sore throat, tinnitus, trouble swallowing and voice change.   Eyes: Negative for eye problems and icterus.  Respiratory: Negative for chest tightness, cough, hemoptysis, shortness of breath and wheezing.   Cardiovascular: Positive for leg swelling. Negative for chest pain and palpitations.  Gastrointestinal: Negative for abdominal distention, abdominal pain, blood in stool, constipation, diarrhea, nausea, rectal pain and vomiting.  Endocrine: Negative for hot flashes.  Genitourinary: Negative for bladder incontinence, difficulty urinating, dyspareunia, dysuria, frequency, hematuria and nocturia.   Musculoskeletal: Positive for back pain. Negative for arthralgias, flank pain, gait problem, myalgias, neck pain and neck stiffness.  Skin: Negative for itching, rash and wound.  Neurological: Negative for dizziness, extremity weakness, gait problem, headaches, light-headedness, numbness, seizures and speech difficulty.  Hematological: Negative for adenopathy. Does not bruise/bleed easily.  Psychiatric/Behavioral: Negative for confusion, decreased concentration, depression, sleep disturbance and suicidal ideas. The patient is not nervous/anxious.      VITALS:  Blood pressure (!) 169/80, pulse (!) 57, temperature 97.8 F (36.6 C), resp. rate 16, weight 238 lb 8 oz (108.2 kg), SpO2 96 %.  Wt Readings from Last 3 Encounters:  02/07/21 238 lb 8 oz (108.2 kg)  02/07/21 238 lb 11.2 oz (108.3 kg)  01/07/21 241 lb (109.3 kg)    Body mass index is 33.74 kg/m.  Performance status (ECOG): 1 - Symptomatic but completely ambulatory  PHYSICAL EXAM:  Physical Exam Constitutional:      General: He is not in acute distress.    Appearance: Normal appearance. He is normal weight.  He is not ill-appearing, toxic-appearing or diaphoretic.  HENT:     Head: Normocephalic and atraumatic.     Right Ear: Tympanic membrane normal.     Left Ear: Tympanic membrane  normal.     Nose: Nose normal. No congestion or rhinorrhea.     Mouth/Throat:     Mouth: Mucous membranes are moist.     Pharynx: Oropharynx is clear. No oropharyngeal exudate or posterior oropharyngeal erythema.  Eyes:     General: No scleral icterus.       Right eye: No discharge.        Left eye: No discharge.     Extraocular Movements: Extraocular movements intact.     Conjunctiva/sclera: Conjunctivae normal.     Pupils: Pupils are equal, round, and reactive to light.  Neck:     Vascular: No carotid bruit.  Cardiovascular:     Rate and Rhythm: Normal rate and regular rhythm.     Heart sounds: No murmur heard. No friction rub. No gallop.   Pulmonary:     Effort: Pulmonary effort is normal. No respiratory distress.     Breath sounds: Normal breath sounds. No stridor. No wheezing, rhonchi or rales.  Chest:     Chest wall: No tenderness.  Abdominal:     General: Abdomen is flat. Bowel sounds are normal. There is no distension.     Palpations: There is no mass.     Tenderness: There is no abdominal tenderness. There is no right CVA tenderness, left CVA tenderness, guarding or rebound.     Hernia: No hernia is present.  Musculoskeletal:        General: No tenderness, deformity or signs of injury. Normal range of motion.     Cervical back: Normal range of motion and neck supple. No rigidity or tenderness.     Right lower leg: Edema present.     Left lower leg: Edema present.  Lymphadenopathy:     Cervical: No cervical adenopathy.  Skin:    General: Skin is warm and dry.     Capillary Refill: Capillary refill takes less than 2 seconds.     Coloration: Skin is not jaundiced or pale.     Findings: No bruising, erythema, lesion or rash.  Neurological:     General: No focal deficit present.     Mental Status: He is alert and oriented to person, place, and time. Mental status is at baseline.     Cranial Nerves: No cranial nerve deficit.     Sensory: No sensory deficit.     Motor:  No weakness.     Coordination: Coordination normal.     Gait: Gait normal.     Deep Tendon Reflexes: Reflexes normal.  Psychiatric:        Mood and Affect: Mood normal.        Behavior: Behavior normal.        Thought Content: Thought content normal.        Judgment: Judgment normal.     LABS:   CBC Latest Ref Rng & Units 02/07/2021 11/05/2020 10/08/2020  WBC - 6.5 6.3 6.6  Hemoglobin 13.5 - 17.5 15.0 15.5 14.7  Hematocrit 41 - 53 43 47 44.7  Platelets 150 - 399 158 179 220   CMP Latest Ref Rng & Units 02/07/2021 11/05/2020 10/08/2020  Glucose 70 - 99 mg/dL - - 133(H)  BUN 4 - 21 11 11 12   Creatinine 0.6 - 1.3 1.1 1.1 1.38(H)  Sodium 137 - 147 136(A)  140 138  Potassium 3.4 - 5.3 4.1 4.0 3.5  Chloride 99 - 108 106 104 102  CO2 13 - 22 23(A) 28(A) 23  Calcium 8.7 - 10.7 8.4(A) 9.2 8.9  Total Protein 6.5 - 8.1 g/dL - - 6.6  Total Bilirubin 0.3 - 1.2 mg/dL - - 0.7  Alkaline Phos 25 - 125 54 63 60  AST 14 - 40 9(A) 23 23  ALT 10 - 40 21 21 26      No results found for: CEA1 / No results found for: CEA1 No results found for: PSA1 No results found for: OQH476 No results found for: CAN125  No results found for: TOTALPROTELP, ALBUMINELP, A1GS, A2GS, BETS, BETA2SER, GAMS, MSPIKE, SPEI No results found for: TIBC, FERRITIN, IRONPCTSAT No results found for: LDH  STUDIES:  MR BRAIN W WO CONTRAST  Result Date: 02/05/2021 CLINICAL DATA:  Metastatic lung cancer.  Assess treatment response. EXAM: MRI HEAD WITHOUT AND WITH CONTRAST TECHNIQUE: Multiplanar, multiecho pulse sequences of the brain and surrounding structures were obtained without and with intravenous contrast. CONTRAST:  30m GADAVIST GADOBUTROL 1 MMOL/ML IV SOLN COMPARISON:  MRI head 11/21/2020, 07/19/2020 FINDINGS: Brain: Right temporal craniotomy for tumor resection. Decreased FLAIR edema in the right temporal lobe. Patchy enhancing area in the right lateral temporal lobe slightly improved, measuring approximately 2 cm in  diameter. 3 mm enhancing lesion right parietal subcortical white matter region is unchanged. No new lesions identified compared to the most recent study. There is dural thickening and enhancement overlying the right cerebral hemisphere which has developed since prior studies. No nodularity. This may be due to prior craniotomy however it is unusual this developed at this late stage. Normal dural enhancement on the left. Generalized atrophy, moderate. Mild white matter changes, chronic. No acute infarct or hemorrhage. Vascular: Normal arterial flow voids Skull and upper cervical spine: Right temporal craniotomy. No focal skeletal metastasis. Sinuses/Orbits: Negative Other: None IMPRESSION: Enhancing lesion at the biopsy site in the right temporal lobe appears slightly smaller. Decreased surrounding FLAIR edema. Likely treatment effect. Interval development of dural thickening enhancement overlying the right cerebral hemisphere uncertain etiology. Possible postsurgical however close follow-up warranted. 3 mm enhancing metastasis right parietal lobe stable. No new lesions. Electronically Signed   By: CFranchot GalloM.D.   On: 02/05/2021 16:37      HISTORY:   Past Medical History:  Diagnosis Date  . Arthritis   . Diabetes mellitus type 2 in obese ([place]   . Full dentures   . GERD (gastroesophageal reflux disease)   . Hyperlipidemia   . Lung cancer [place]   . [place] brain ([place]   . Pneumonia   . Wears glasses     Past Surgical History:  Procedure Laterality Date  . APPLICATION OF CRANIAL NAVIGATION N/A 04/19/2020   Procedure: APPLICATION OF CRANIAL NAVIGATION;  Surgeon: CKary Kos MD;  [facility]  Service: Neurosurgery;  Laterality: N/A;  APPLICATION OF CRANIAL NAVIGATION  . COLONOSCOPY W/ BIOPSIES AND POLYPECTOMY    . CRANIOTOMY Right 04/19/2020   Procedure: Right Temporal stereotactic craniotomy;  Surgeon: CKary Kos MD;  [facility]  Service: Neurosurgery;  Laterality:  Right;  . FRACTURE SURGERY     right shoulder  . MULTIPLE TOOTH EXTRACTIONS      No family history on file.  Social History:  reports that he has quit smoking. His smoking use included cigarettes. He has never used smokeless tobacco. He reports previous alcohol use. He reports that he does  not use drugs.The patient is accompanied by wife today.  Allergies:  Allergies  Allergen Reactions  . Sulfamethoxazole Nausea Only    Childhood allergy    Current Medications: Current Outpatient Medications  Medication Sig Dispense Refill  . furosemide (LASIX) 20 MG tablet Take 1 tablet (20 mg total) by mouth daily as needed for edema. 30 tablet 0  . acetaminophen (TYLENOL) 325 MG tablet Take 650 mg by mouth every 6 (six) hours as needed.    . Alogliptin Benzoate 25 MG TABS Take 0.5 tablets by mouth in the morning and at bedtime. 60 tablet 0  . calcium-vitamin D (OSCAL WITH D) 500-200 MG-UNIT tablet Take 1 tablet by mouth 2 (two) times daily.    . capmatinib (TABRECTA) 200 MG tablet Take 400 mg by mouth 2 (two) times daily.    Marland Kitchen dexamethasone (DECADRON) 4 MG tablet Take 4 mg by mouth 3 (three) times daily. (Patient not taking: Reported on 11/28/2020)    . esomeprazole (NEXIUM) 40 MG capsule Take 40 mg by mouth daily at 12 noon.    Marland Kitchen ibuprofen (ADVIL) 200 MG tablet Take 200 mg by mouth every 6 (six) hours as needed.    Marland Kitchen LORazepam (ATIVAN) 0.5 MG tablet 1 tab po 30 minutes prior to radiation or MRI 10 tablet 0  . Psyllium (METAMUCIL PO) Take 1 Scoop by mouth daily.     No current facility-administered medications for this visit.     ASSESSMENT & PLAN:   Assessment:  Alex Cordova is a 75 y.o. male with metastatic non-small cell lung cancer. He notes bilateral lower extremity edema over the last couple of weeks. This is most likely related to his capmatinib. He does not notice any pain and the swelling mostly goes away by morning. He notes feeling bloated on occasion and feels like he may  have some fluid on his abdomen as well. Physical exam is negative today. We discussed including more protein in his diet as his total protein and albumin are decreased today and this can lead to edema.   Plan: I will send in prn lasix 20 mg. I have advised him to only take this as needed for swelling and to take it in the mornings if he does need it. He will include more protein in his diet.  He will keep his scheduled follow up with Dr. Bobby Rumpf.  Both he and his wife verbalize understanding of the plans discussed today. They know to call the office should any new questions or concerns arise.      Melodye Ped, NP

## 2021-02-10 ENCOUNTER — Inpatient Hospital Stay: Payer: No Typology Code available for payment source

## 2021-02-21 ENCOUNTER — Other Ambulatory Visit: Payer: Self-pay | Admitting: Radiation Therapy

## 2021-03-03 DIAGNOSIS — C7952 Secondary malignant neoplasm of bone marrow: Secondary | ICD-10-CM | POA: Insufficient documentation

## 2021-03-03 DIAGNOSIS — C7951 Secondary malignant neoplasm of bone: Secondary | ICD-10-CM | POA: Insufficient documentation

## 2021-03-04 ENCOUNTER — Other Ambulatory Visit: Payer: Self-pay | Admitting: Hematology and Oncology

## 2021-03-04 MED ORDER — FUROSEMIDE 20 MG PO TABS: 20.0000 mg | ORAL_TABLET | Freq: Every day | ORAL | 0 refills | Status: DC | PRN

## 2021-03-06 NOTE — Progress Notes (Signed)
[facility]  [address] [place],  [place]  [address] [phone]  Clinic Day:  03/07/2021  Referring physician: Caralyn Guile, DO   HISTORY OF PRESENT ILLNESS:  The patient is a 75 y.o. male with metastatic non-small cell lung cancer, which includes brain metastasis and a metastatic focus of disease to his left acromion process.  His initial lung cancer pathology showed squamous cell carcinoma.  However, pathology from his brain resection done earlier this year showed adenocarcinoma. 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  Recent scans showed an improvement in his lung nodule.  A brain MRI done recently did not show obvious evidence of CNS progression.   He comes in today for routine follow-up.  Since his last visit, the patient has been doing very well.  He denies having any CNS, respiratory or other systemic symptoms which concern him for disease progression.  PHYSICAL EXAM:  Blood pressure (!) 204/98, pulse (!) 55, temperature 98 F (36.7 C), resp. rate 16, height 5' 10.5" (1.791 m), weight 237 lb 12.8 oz (107.9 kg), SpO2 96 %. Wt Readings from Last 3 Encounters:  03/07/21 237 lb 12.8 oz (107.9 kg)  02/07/21 238 lb 8 oz (108.2 kg)  02/07/21 238 lb 11.2 oz (108.3 kg)   Body mass index is 33.64 kg/m. Performance status: 1 Physical Exam Constitutional:      Appearance: Normal appearance. He is not ill-appearing.  HENT:     Mouth/Throat:     Mouth: Mucous membranes are moist.     Pharynx: Oropharynx is clear. No oropharyngeal exudate or posterior oropharyngeal erythema.  Cardiovascular:     Rate and Rhythm: Normal rate and regular rhythm.     Heart sounds: No murmur heard. No friction rub. No gallop.   Pulmonary:     Effort: Pulmonary effort is normal. No respiratory distress.     Breath sounds: Normal breath sounds. No wheezing, rhonchi or  rales.  Chest:  Breasts:     Right: No axillary adenopathy or supraclavicular adenopathy.     Left: No axillary adenopathy or supraclavicular adenopathy.    Abdominal:     General: Bowel sounds are normal. There is no distension.     Palpations: Abdomen is soft. There is no mass.     Tenderness: There is no abdominal tenderness.  Musculoskeletal:        General: No swelling.     Right lower leg: No edema.     Left lower leg: No edema.  Lymphadenopathy:     Cervical: No cervical adenopathy.     Upper Body:     Right upper body: No supraclavicular or axillary adenopathy.     Left upper body: No supraclavicular or axillary adenopathy.     Lower Body: No right inguinal adenopathy. No left inguinal adenopathy.  Skin:    General: Skin is warm.     Coloration: Skin is not jaundiced.     Findings: No lesion or rash.  Neurological:     General: No focal deficit present.     Mental Status: He is alert and oriented to person, place, and time. Mental status is at baseline.     Cranial Nerves: Cranial nerves are intact.  Psychiatric:        Mood and Affect: Mood normal.        Behavior: Behavior normal.  Thought Content: Thought content normal.    ASSESSMENT & PLAN:  Assessment/Plan:  A 75 y.o. 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  His initial biopsy revealed squamous cell carcinoma, but his metastatic temporal lobe lesion came back showing adenocarcinoma.  Clinically, the patient is doing very well.  He continues to tolerate his current capmatinib dose without any significant side effects.  I will see him back in 2 months for repeat clinical assessment.  Repeat scans will be done before his next visit to ensure there remains no radiographic evidence of disease progression while on capmatinib.  The patient understands all the plans discussed today and is in agreement with them.    Raquel Sayres Macarthur Critchley, MD

## 2021-03-07 ENCOUNTER — Other Ambulatory Visit: Payer: Self-pay | Admitting: Hematology and Oncology

## 2021-03-07 ENCOUNTER — Telehealth: Payer: Self-pay | Admitting: Oncology

## 2021-03-07 ENCOUNTER — Inpatient Hospital Stay (INDEPENDENT_AMBULATORY_CARE_PROVIDER_SITE_OTHER): Payer: No Typology Code available for payment source | Admitting: Oncology

## 2021-03-07 ENCOUNTER — Other Ambulatory Visit: Payer: Self-pay

## 2021-03-07 ENCOUNTER — Inpatient Hospital Stay: Payer: No Typology Code available for payment source | Attending: Internal Medicine

## 2021-03-07 ENCOUNTER — Other Ambulatory Visit: Payer: Self-pay | Admitting: Oncology

## 2021-03-07 DIAGNOSIS — C3412 Malignant neoplasm of upper lobe, left bronchus or lung: Secondary | ICD-10-CM

## 2021-03-07 DIAGNOSIS — C7952 Secondary malignant neoplasm of bone marrow: Secondary | ICD-10-CM

## 2021-03-07 DIAGNOSIS — C7951 Secondary malignant neoplasm of bone: Secondary | ICD-10-CM | POA: Diagnosis not present

## 2021-03-07 LAB — CBC AND DIFFERENTIAL
HCT: 47 (ref 41–53)
Hemoglobin: 15.1 (ref 13.5–17.5)
Neutrophils Absolute: 3.84
Platelets: 168 (ref 150–399)
WBC: 6.1

## 2021-03-07 LAB — BASIC METABOLIC PANEL
BUN: 12 (ref 4–21)
CO2: 26 — AB (ref 13–22)
Chloride: 106 (ref 99–108)
Creatinine: 1.2 (ref 0.6–1.3)
Glucose: 117
Potassium: 4 (ref 3.4–5.3)
Sodium: 137 (ref 137–147)

## 2021-03-07 LAB — COMPREHENSIVE METABOLIC PANEL
Albumin: 3.4 — AB (ref 3.5–5.0)
Calcium: 8.3 — AB (ref 8.7–10.7)

## 2021-03-07 LAB — HEPATIC FUNCTION PANEL
ALT: 23 (ref 10–40)
AST: 25 (ref 14–40)
Alkaline Phosphatase: 55 (ref 25–125)
Bilirubin, Total: 0.5

## 2021-03-07 LAB — CBC
MCV: 89 (ref 80–94)
RBC: 5.24 — AB (ref 3.87–5.11)

## 2021-03-07 MED ORDER — FUROSEMIDE 20 MG PO TABS: 20.0000 mg | ORAL_TABLET | Freq: Every day | ORAL | 5 refills | Status: DC | PRN

## 2021-03-07 NOTE — Telephone Encounter (Signed)
Per Dr Bobby Rumpf, patient scheduled for 5/31 Labs, CT scans - 6/2 Follow Up.  Gave patient Orders/Instructions/Appt Summary

## 2021-03-19 DIAGNOSIS — R0600 Dyspnea, unspecified: Secondary | ICD-10-CM | POA: Diagnosis not present

## 2021-03-19 DIAGNOSIS — R051 Acute cough: Secondary | ICD-10-CM | POA: Diagnosis not present

## 2021-04-25 ENCOUNTER — Other Ambulatory Visit: Payer: Self-pay

## 2021-04-25 ENCOUNTER — Ambulatory Visit (HOSPITAL_COMMUNITY)
Admission: RE | Admit: 2021-04-25 | Discharge: 2021-04-25 | Disposition: A | Discharge status: Home / Self Care | Payer: No Typology Code available for payment source | Source: Ambulatory Visit | Attending: Internal Medicine | Admitting: Internal Medicine

## 2021-04-25 DIAGNOSIS — C7931 Secondary malignant neoplasm of brain: Secondary | ICD-10-CM | POA: Diagnosis not present

## 2021-04-25 IMAGING — MR MR HEAD WO/W CM
10 of 14 series · 22 of 48 positions shown · IV contrast (10 ML gad)
Comparison: MRI of the brain [DATE].

CLINICAL DATA: Brain/CNS neoplasm.

EXAM:
MRI HEAD WITHOUT AND WITH CONTRAST
TECHNIQUE: Multiplanar, multiecho pulse sequences of the brain and surrounding
structures were obtained without and with intravenous contrast.
CONTRAST:  10mL GADAVIST GADOBUTROL 1 MMOL/ML IV SOLN

[Series 2: FLAIR · sagittal · 3.0mm · 0.47mm/px · 1 of 48 slices shown (1 of 2)]
[im 1/48]
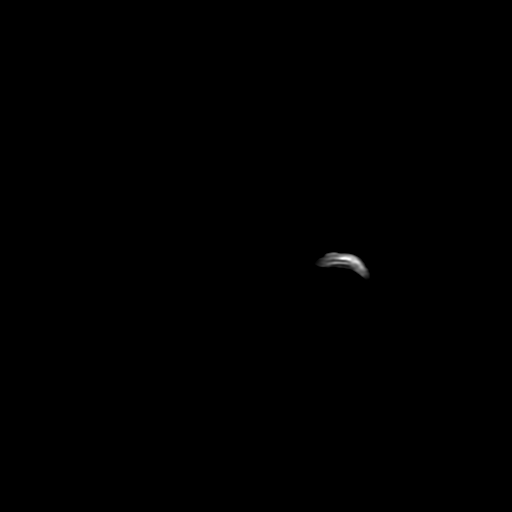

[Series 3: DWI · axial · 3.0mm · 0.94mm/px · z∈[-97,+104]mm · 3 of 136 slices shown]
[im 1/136]
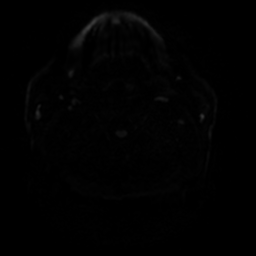
[im 68/136]
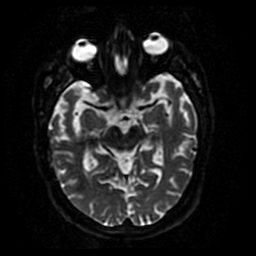
[im 136/136]
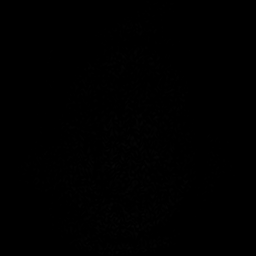

[Series 4: FLAIR · axial · 3.0mm · 0.47mm/px · z∈[-87,+114]mm · 2 of 68 slices shown (2 of 2)]
[im 1/68]
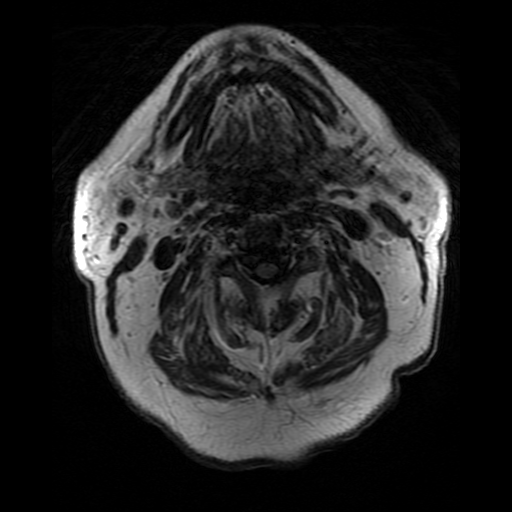
[im 68/68]
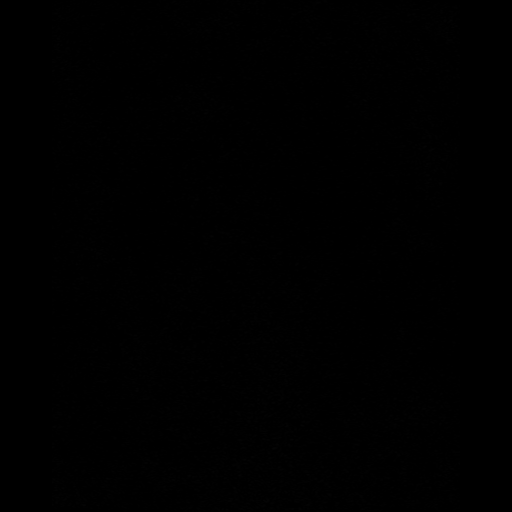

[Series 5: SWI · axial · 3.0mm · 0.47mm/px · z∈[-89,+105]mm · 3 of 130 slices shown]
[im 1/130]
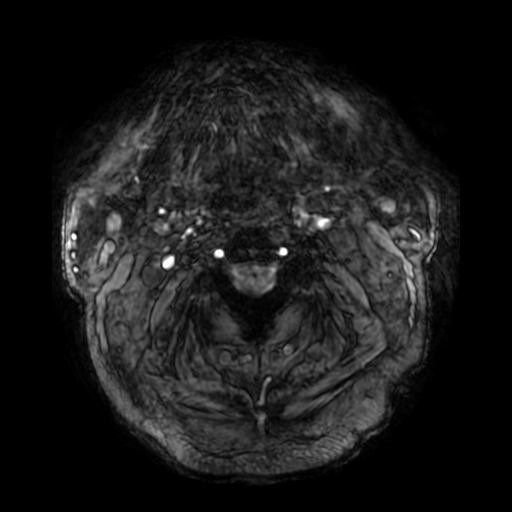
[im 65/130]
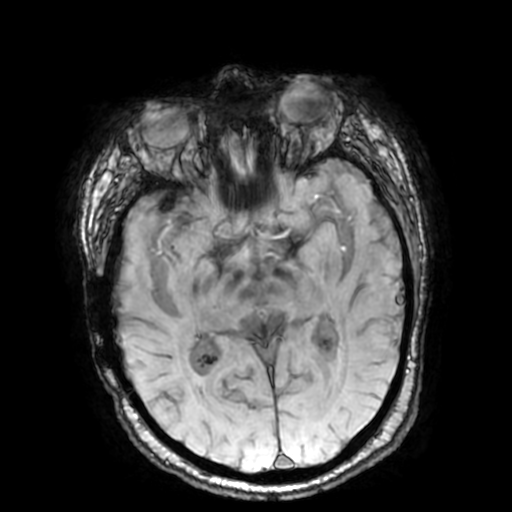
[im 130/130]
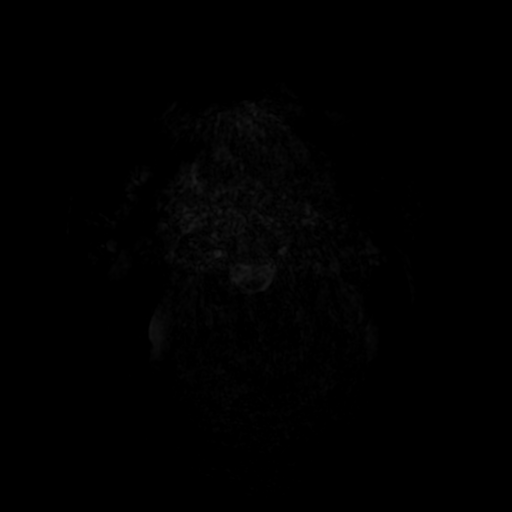

[Series 7: T2 post-contrast · coronal · 3.0mm · 0.39mm/px · 1 of 57 slices shown (1 of 2)]
[im 1/57]
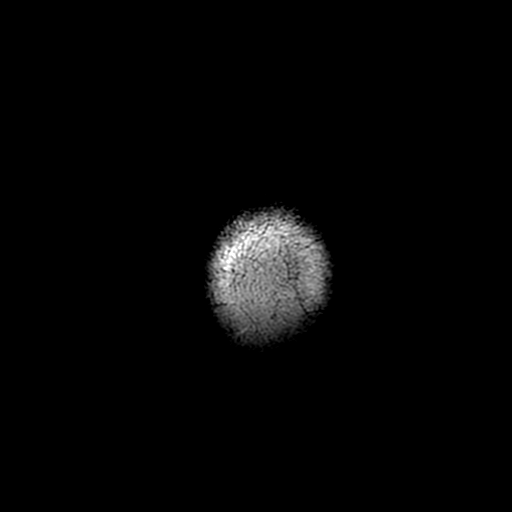

[Series 8: T2 post-contrast · axial · 5.0mm · 0.47mm/px · 1 of 36 slices shown (2 of 2)]
[im 1/36]
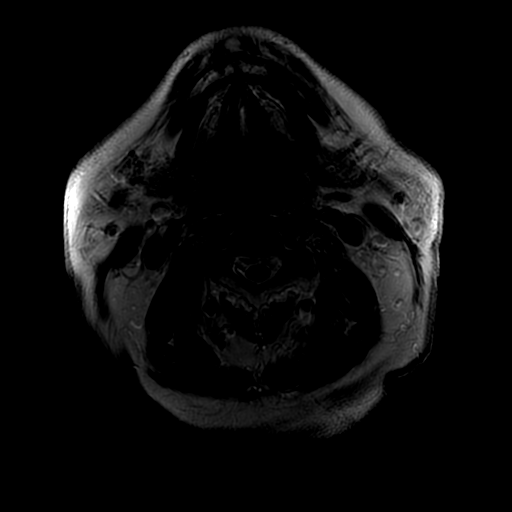

[Series 9: T1 post-contrast · coronal · 3.0mm · 0.39mm/px · 1 of 57 slices shown (1 of 2)]
[im 1/57]
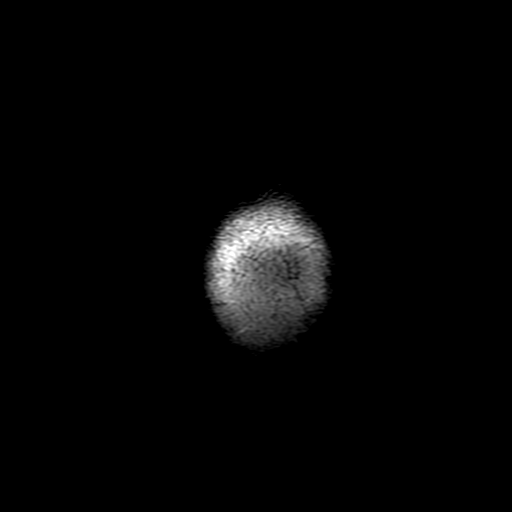

[Series 10: FLAIR post-contrast · sagittal · 3.0mm · 0.47mm/px · 1 of 48 slices shown]
[im 1/48]
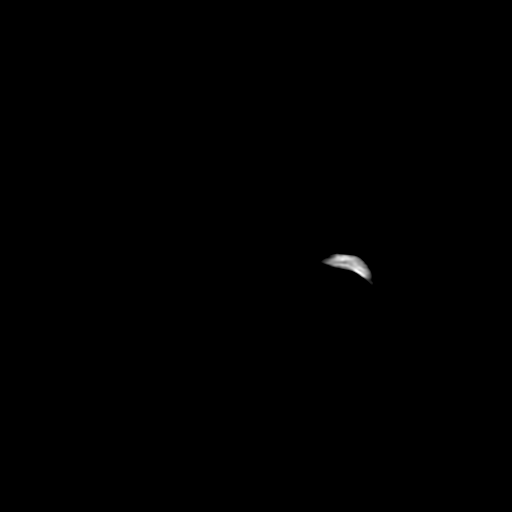

[Series 350: ADC · axial · 3.0mm · 0.94mm/px · z∈[-97,+104]mm · 2 of 68 slices shown]
[im 1/68]
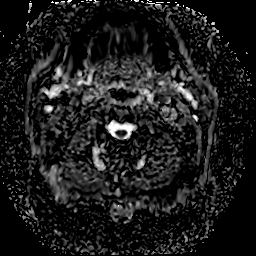
[im 68/68]
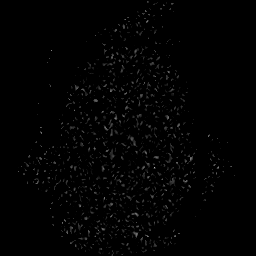

[Series 1100: T1 post-contrast · axial · 0.9mm · 0.50mm/px · z∈[-135,+119]mm · 7 of 301 slices shown (2 of 2)]
[im 1/301]
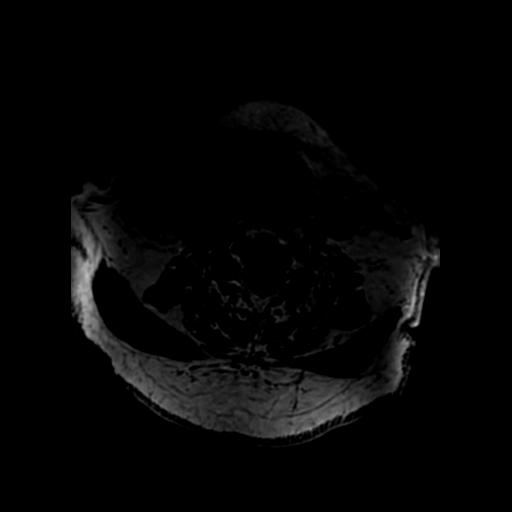
[im 51/301]
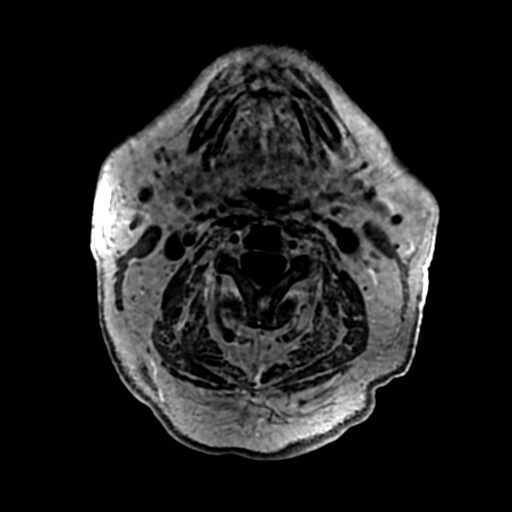
[im 101/301]
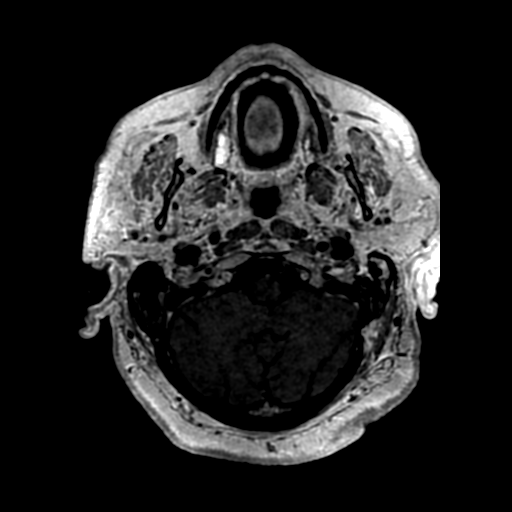
[im 151/301]
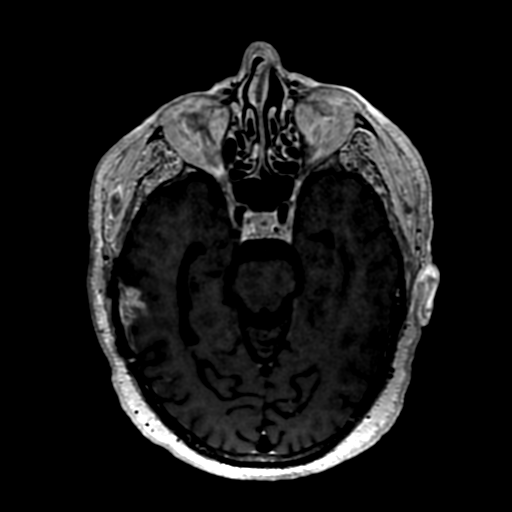
[im 201/301]
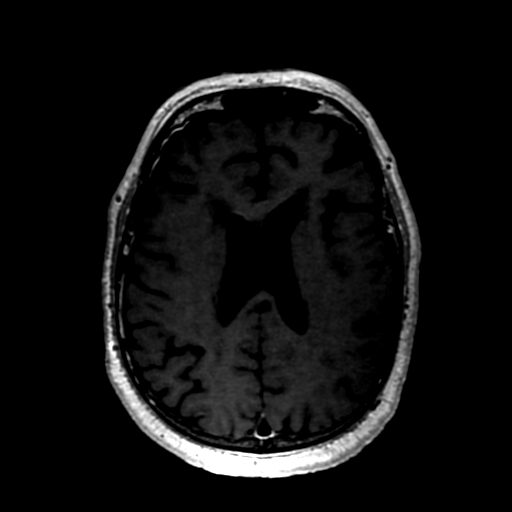
[im 251/301]
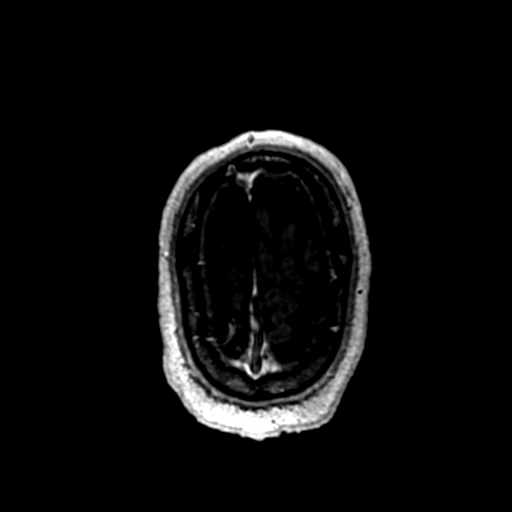
[im 301/301]
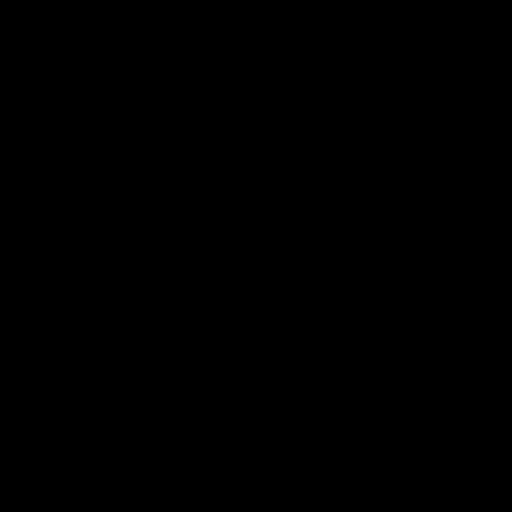

[22 of 48 positions shown; findings below may reference images not displayed]

FINDINGS: Brain: Postsurgical changes from right craniotomy for tumor
resection. Stable appearance of the area of irregular contrast
enhancement in the surgical cavity in the right temporal lobe
measuring approximately 2 x 1.2 centum with improvement of the
surrounding T2 hyperintensity. Increased thickness and contrast
enhancement subjacent to the craniotomy is stable while the diffuse
right-sided dural thickening and contrast enhancement described on
prior MRI has largely resolved. Again seen is a 2 mm focus of
contrast enhancement within the subcortical white matter of the
right parietal lobes, unchanged. No new focus of abnormal contrast
enhancement.

No acute infarction, hemorrhage, hydrocephalus, or extra-axial
collection.

Scattered and confluent foci of T2 hyperintensity are seen within
the white matter of the cerebral hemispheres, nonspecific, may be
related to small vessel ischemia.

Vascular: Normal flow voids.

Skull and upper cervical spine: Postsurgical changes from right
temporal craniotomy. Marrow signal characteristics are otherwise
maintained.

Sinuses/Orbits: Negative.

Other: Minimal bilateral mastoid effusion.
IMPRESSION: 1. Stable appearance of area of irregular contrast enhancement
within the surgical cavity in the right temporal lobe with decrease
T2 hyperintensity within the adjacent brain parenchyma.
2. Stable appearance of 2 mm focus of contrast enhancement in the
right parietal lobe.
3. Interval near resolution of the right convexity diffusely
increased thickness and contrast enhancement.

## 2021-04-25 MED ORDER — GADOBUTROL 1 MMOL/ML IV SOLN
10.0000 mL | Freq: Once | INTRAVENOUS | Status: AC | PRN
  Administered 2021-04-25: 10 mL via INTRAVENOUS

## 2021-04-28 ENCOUNTER — Inpatient Hospital Stay: Payer: No Typology Code available for payment source | Attending: Internal Medicine

## 2021-05-06 NOTE — Progress Notes (Signed)
[facility]  [address] [place],  [place]  [address] [phone]  Clinic Day:  05/08/2021  Referring physician: Caralyn Guile, DO  This document serves as a record of services personally performed by Marice Potter, MD. It was created on their behalf by Curry,Lauren E, a trained medical scribe. The creation of this record is based on the scribe's personal observations and the provider's statements to them.  HISTORY OF PRESENT ILLNESS:  The patient is a 75 y.o. male with metastatic non-small cell lung cancer, which includes brain metastasis and a metastatic focus of disease to his left acromion process.  His initial lung cancer pathology showed squamous cell carcinoma.  However, pathology from his brain resection done earlier this year showed adenocarcinoma. 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  He comes in today to go over his CT scans to ascertain his new disease baseline.  Since his last visit, the patient has been doing fairly well.  He does get occasionally dizzy, which he attributes to the lasix he takes for peripheral edema.  There are times in which he loses multiple pounds daily, which he attributes to the excessive diuresis of fluids from his body.  From a lung cancer standpoint, he denies having any new symptoms which concern him for disease recurrence.  He also had a brain MRI done recently, which showed no evidence of new CNS disease.  PHYSICAL EXAM:  Blood pressure (!) 164/88, pulse (!) 57, temperature 98 F (36.7 C), resp. rate 16, height 5' 10.5" (1.791 m), weight 237 lb 11.2 oz (107.8 kg), SpO2 95 %. Wt Readings from Last 3 Encounters:  05/08/21 237 lb 11.2 oz (107.8 kg)  03/07/21 237 lb 12.8 oz (107.9 kg)  02/07/21 238 lb 8 oz (108.2 kg)   Body mass index is 33.62 kg/m. Performance status: 1 Physical Exam Constitutional:       Appearance: Normal appearance. He is not ill-appearing.  HENT:     Mouth/Throat:     Mouth: Mucous membranes are moist.     Pharynx: Oropharynx is clear. No oropharyngeal exudate or posterior oropharyngeal erythema.  Cardiovascular:     Rate and Rhythm: Normal rate and regular rhythm.     Heart sounds: No murmur heard. No friction rub. No gallop.   Pulmonary:     Effort: Pulmonary effort is normal. No respiratory distress.     Breath sounds: Normal breath sounds. No wheezing, rhonchi or rales.  Chest:  Breasts:     Right: No axillary adenopathy or supraclavicular adenopathy.     Left: No axillary adenopathy or supraclavicular adenopathy.    Abdominal:     General: Bowel sounds are normal. There is no distension.     Palpations: Abdomen is soft. There is no mass.     Tenderness: There is no abdominal tenderness.  Musculoskeletal:        General: No swelling.     Right lower leg: No edema.     Left lower leg: No edema.  Lymphadenopathy:     Cervical: No cervical adenopathy.     Upper Body:     Right upper body: No supraclavicular or axillary adenopathy.     Left upper body: No supraclavicular or axillary adenopathy.     Lower Body: No right inguinal adenopathy. No left inguinal adenopathy.  Skin:    General: Skin is warm.  Coloration: Skin is not jaundiced.     Findings: No lesion or rash.  Neurological:     General: No focal deficit present.     Mental Status: He is alert and oriented to person, place, and time. Mental status is at baseline.     Cranial Nerves: Cranial nerves are intact.  Psychiatric:        Mood and Affect: Mood normal.        Behavior: Behavior normal.        Thought Content: Thought content normal.    SCAN RESULTS;CT scans of his chest/abdomen/pelvis revealed the following: FINDINGS: CT CHEST FINDINGS  Cardiovascular: Accessed right chest Port-A-Cath with tip at the superior cavoatrial junction. Aortic atherosclerosis without aneurysmal  dilation. No central pulmonary embolus. Normal size heart. No significant pericardial effusion/thickening.  Mediastinum/Nodes: No pathologically enlarged supraclavicular, mediastinal, hilar or axillary lymph nodes. No suspicious thyroid nodularity. The trachea and esophagus are grossly unremarkable.  Lungs/Pleura: Increase size of the irregular posterior left upper lobe pulmonary nodule which measures 1.7 x 1.0 cm previously 1.2 x 0.9 cm when remeasured for consistency. No new suspicious pulmonary nodules or masses.  Musculoskeletal: No aggressive lytic or blastic lesions of bone. Unchanged size of 1.2 cm probable sebaceous cyst in the posterior paramedian subcutaneous soft tissues on image 15/2.Marland Kitchen  CT ABDOMEN PELVIS FINDINGS  Hepatobiliary: Hepatic steatosis. Gallbladder is unremarkable. No biliary ductal dilation.  Pancreas: Within normal limits.  Spleen: Within normal limits.  Adrenals/Urinary Tract: Bilateral adrenal glands are unremarkable.  No hydronephrosis. No solid enhancing renal lesions. Symmetric enhancement excretion of contrast in bilateral kidneys.  Urinary bladder is grossly unremarkable for degree of distension.  Stomach/Bowel: Stomach is grossly unremarkable. Duodenal diverticulum. Normal positioning of the duodenum/ligament of Treitz. No pathologic dilation of small bowel. The appendix and terminal ileum are grossly unremarkable. Colonic diverticulosis without findings of acute diverticulitis.  Vascular/Lymphatic: Aortic atherosclerosis without aneurysmal dilation. No pathologically enlarged abdominal or pelvic lymph nodes.  Reproductive: Prostate is unremarkable.  Other: No abdominopelvic ascites.  Musculoskeletal: Multilevel degenerative changes spine. Left iliac bone island. No aggressive lytic or blastic lesions of bone.  IMPRESSION: 1. Increase size of the irregular posterior left upper lobe pulmonary nodule, consistent with disease  progression. 2. No evidence of new metastatic disease in the chest abdomen or pelvis 3. Hepatic steatosis. 4. Colonic diverticulosis without findings of acute diverticulitis. 5. Emphysema and aortic atherosclerosis. Aortic Atherosclerosis (ICD10-I70.0) and Emphysema (ICD10-J43.9).  ASSESSMENT & PLAN:  Assessment/Plan:  A 75 y.o. 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  His initial biopsy revealed squamous cell carcinoma, but his metastatic temporal lobe lesion came back showing adenocarcinoma.  In clinic today, I went over his CT scan images with him, for which he could see how his lung lung mass has grown very mildly in size.  There appears to be no other evidence of disease progression.  I would like for this patient to be seen by radiation oncology in [facility] to discuss stereotactic radiation to his lung nodule..Clinically, the patient is doing very well.  Despite his mild disease progression, he will continue taking his capmatinib  I will see him back in 2 months for repeat clinical assessment. The patient understands all the plans discussed today and is in agreement with them.    I, Rita Ohara, am acting as scribe for Marice Potter, MD    I have reviewed this report as typed by the medical scribe, and it  is complete and accurate.  Dequincy Macarthur Critchley, MD

## 2021-05-08 ENCOUNTER — Other Ambulatory Visit: Payer: Self-pay

## 2021-05-08 ENCOUNTER — Other Ambulatory Visit: Payer: Self-pay | Admitting: Oncology

## 2021-05-08 ENCOUNTER — Telehealth: Payer: Self-pay | Admitting: Oncology

## 2021-05-08 ENCOUNTER — Inpatient Hospital Stay: Payer: No Typology Code available for payment source | Attending: Internal Medicine | Admitting: Oncology

## 2021-05-08 VITALS — BP 164/88 | HR 57 | Temp 98.0°F | Resp 16 | Ht 70.5 in | Wt 237.7 lb

## 2021-05-08 DIAGNOSIS — C7931 Secondary malignant neoplasm of brain: Secondary | ICD-10-CM

## 2021-05-08 DIAGNOSIS — C3412 Malignant neoplasm of upper lobe, left bronchus or lung: Secondary | ICD-10-CM

## 2021-05-08 NOTE — Telephone Encounter (Signed)
Per 6/2 LOS,patient scheduled for Aug Appt's.  Gave patient Appt Summary

## 2021-05-13 ENCOUNTER — Inpatient Hospital Stay: Payer: No Typology Code available for payment source | Attending: Internal Medicine | Admitting: Internal Medicine

## 2021-05-13 ENCOUNTER — Other Ambulatory Visit: Payer: Self-pay

## 2021-05-13 VITALS — BP 128/79 | HR 83 | Temp 98.1°F | Resp 17 | Wt 234.4 lb

## 2021-05-13 DIAGNOSIS — Z87891 Personal history of nicotine dependence: Secondary | ICD-10-CM | POA: Insufficient documentation

## 2021-05-13 DIAGNOSIS — E119 Type 2 diabetes mellitus without complications: Secondary | ICD-10-CM | POA: Diagnosis not present

## 2021-05-13 DIAGNOSIS — Z79899 Other long term (current) drug therapy: Secondary | ICD-10-CM | POA: Insufficient documentation

## 2021-05-13 DIAGNOSIS — E785 Hyperlipidemia, unspecified: Secondary | ICD-10-CM | POA: Diagnosis not present

## 2021-05-13 DIAGNOSIS — C3490 Malignant neoplasm of unspecified part of unspecified bronchus or lung: Secondary | ICD-10-CM | POA: Insufficient documentation

## 2021-05-13 DIAGNOSIS — Z7952 Long term (current) use of systemic steroids: Secondary | ICD-10-CM | POA: Diagnosis not present

## 2021-05-13 DIAGNOSIS — C7931 Secondary malignant neoplasm of brain: Secondary | ICD-10-CM | POA: Insufficient documentation

## 2021-05-13 NOTE — Progress Notes (Signed)
[facility] [place], [place] [address] [phone]   Interval Evaluation  Date of Service: 05/13/21 Patient Name: Kastiel Simonian Patient MRN: [id number] Patient DOB: Jan 29, 1946 Provider: Ventura Sellers, MD  Identifying Statement:  Shyloh Krinke is a 75 y.o. male with Brain metastasis [facility]) [C79.31]   Primary Cancer: [place] Lung Stage IV  Oncologic History: 04/18/20: Pre-op SRS R temporal 14 Gy 04/19/20: Craniotomy, resection by Dr. Saintclair Halsted 07/30/20: Salvage SRS R subcortex  Interval History:  Abanoub Hanken presents today for follow up after recent MRI brain.  No new or progressive neurologic deficits described.  No further issues with his left side. No seizures or headaches.  Continues on Capmatinib as prior, though he may be getting radiation to lung mass which has progressed somewhat.  H+P (07/23/20) Patient presents to clinic today for follow up after 3 months post-SRS/craniotomy MRI scan.  He describes no new or progressive neurologic deficits.  No headaches or seizures.  He no longer takes Keppra and he has recently completed tapering off all corticosteroids.  He remains relatively active around the home, independent with gait and activities of daily living.  Currently on observation and serial imaging monitoring with Dr. Bobby Rumpf for lung cancer.   Medications: Current Outpatient Medications on File Prior to Visit  Medication Sig Dispense Refill  . acetaminophen (TYLENOL) 325 MG tablet Take 650 mg by mouth every 6 (six) hours as needed.    . Alogliptin Benzoate 25 MG TABS Take 0.5 tablets by mouth in the morning and at bedtime. 60 tablet 0  . calcium-vitamin D (OSCAL WITH D) 500-200 MG-UNIT tablet Take 1 tablet by mouth 2 (two) times daily.    . capmatinib (TABRECTA) 200 MG tablet Take 400 mg by mouth 2 (two) times daily.    Marland Kitchen dexamethasone (DECADRON) 4 MG tablet Take 4 mg by mouth 3 (three) times daily.  (Patient not taking: Reported on 11/28/2020)    . esomeprazole (NEXIUM) 40 MG capsule Take 40 mg by mouth daily at 12 noon.    . furosemide (LASIX) 20 MG tablet Take 1 tablet (20 mg total) by mouth daily as needed for edema. 30 tablet 5  . ibuprofen (ADVIL) 200 MG tablet Take 200 mg by mouth every 6 (six) hours as needed.    Marland Kitchen LORazepam (ATIVAN) 0.5 MG tablet 1 tab po 30 minutes prior to radiation or MRI 10 tablet 0  . Psyllium (METAMUCIL PO) Take 1 Scoop by mouth daily.     No current facility-administered medications on file prior to visit.    Allergies:  Allergies  Allergen Reactions  . Sulfamethoxazole Nausea Only    Childhood allergy   Past Medical History:  Past Medical History:  Diagnosis Date  . Arthritis   . Diabetes mellitus type 2 in obese ([employer])   . Full dentures   . GERD (gastroesophageal reflux disease)   . Hyperlipidemia   . Lung cancer ([place])   . Metastatic cancer to brain ([place])   . Pneumonia   . Wears glasses    Past Surgical History:  Past Surgical History:  Procedure Laterality Date  . APPLICATION OF CRANIAL NAVIGATION N/A 04/19/2020   Procedure: APPLICATION OF CRANIAL NAVIGATION;  Surgeon: Kary Kos, MD;  Location: [facility];  Service: Neurosurgery;  Laterality: N/A;  APPLICATION OF CRANIAL NAVIGATION  . COLONOSCOPY W/ BIOPSIES AND POLYPECTOMY    . CRANIOTOMY Right 04/19/2020   Procedure: Right Temporal stereotactic craniotomy;  Surgeon: Kary Kos, MD;  Location: [facility];  Service: Neurosurgery;  Laterality: Right;  . FRACTURE SURGERY     right shoulder  . MULTIPLE TOOTH EXTRACTIONS     Social History:  Social History   Socioeconomic History  . Marital status: Married    Spouse name: Not on file  . Number of children: Not on file  . Years of education: Not on file  . Highest education level: Not on file  Occupational History  . Not on file  Tobacco Use  . Smoking status: Former Smoker    Types: Cigarettes  . Smokeless tobacco: Never Used  . Tobacco  comment: quit smoking in 1972  Vaping Use  . Vaping Use: Never used  Substance and Sexual Activity  . Alcohol use: Not Currently    Comment: not since 1972  . Drug use: Never  . Sexual activity: Not on file  Other Topics Concern  . Not on file  Social History Narrative  . Not on file   Social Determinants of Health   Financial Resource Strain: Not on file  Food Insecurity: Not on file  Transportation Needs: Not on file  Physical Activity: Not on file  Stress: Not on file  Social Connections: Not on file  Intimate Partner Violence: Not on file   Family History: No family history on file.  Review of Systems: Constitutional: Doesn't report fevers, chills or abnormal weight loss Eyes: Doesn't report blurriness of vision Ears, nose, mouth, throat, and face: Doesn't report sore throat Respiratory: Doesn't report cough, dyspnea or wheezes Cardiovascular: Doesn't report palpitation, chest discomfort  Gastrointestinal:  Doesn't report nausea, constipation, diarrhea GU: Doesn't report incontinence Skin: Doesn't report skin rashes Neurological: Per HPI Musculoskeletal: Doesn't report joint pain Behavioral/Psych: Doesn't report anxiety  Physical Exam: Vitals:   05/13/21 1040  BP: 128/79  Pulse: 83  Resp: 17  Temp: 98.1 F (36.7 C)  SpO2: 94%   KPS: 90. General: Alert, cooperative, pleasant, in no acute distress Head: Normal EENT: No conjunctival injection or scleral icterus.  Lungs: Resp effort normal Cardiac: Regular rate Abdomen: Non-distended abdomen Skin: No rashes cyanosis or petechiae. Extremities: No clubbing or edema  Neurologic Exam: Mental Status: Awake, alert, attentive to examiner. Oriented to self and environment. Language is fluent with intact comprehension.  Cranial Nerves: Visual acuity is grossly normal. Visual fields are full. Extra-ocular movements intact. No ptosis. Face is symmetric Motor: Tone and bulk are normal. Power is full in both arms and  legs. Reflexes are symmetric, no pathologic reflexes present.  Sensory: Intact to light touch Gait: Normal.   Labs: I have reviewed the data as listed    Component Value Date/Time   NA 137 03/07/2021 0000   K 4.0 03/07/2021 0000   CL 106 03/07/2021 0000   CO2 26 (A) 03/07/2021 0000   GLUCOSE 133 (H) 10/08/2020 0919   BUN 12 03/07/2021 0000   CREATININE 1.2 03/07/2021 0000   CREATININE 1.38 (H) 10/08/2020 0919   CALCIUM 8.3 (A) 03/07/2021 0000   PROT 6.6 10/08/2020 0919   ALBUMIN 3.4 (A) 03/07/2021 0000   AST 25 03/07/2021 0000   AST 23 10/08/2020 0919   ALT 23 03/07/2021 0000   ALT 26 10/08/2020 0919   ALKPHOS 55 03/07/2021 0000   BILITOT 0.7 10/08/2020 0919   GFRNONAA 53 (L) 10/08/2020 0919   GFRAA >60 07/23/2020 1253   Lab Results  Component Value Date   WBC 6.1 03/07/2021   NEUTROABS 3.84 03/07/2021   HGB  15.1 03/07/2021   HCT 47 03/07/2021   MCV 89 03/07/2021   PLT 168 03/07/2021    Imaging:  MR BRAIN W WO CONTRAST  Result Date: 04/27/2021 CLINICAL DATA:  Brain/CNS neoplasm. EXAM: MRI HEAD WITHOUT AND WITH CONTRAST TECHNIQUE: Multiplanar, multiecho pulse sequences of the brain and surrounding structures were obtained without and with intravenous contrast. CONTRAST:  56mL GADAVIST GADOBUTROL 1 MMOL/ML IV SOLN COMPARISON:  MRI of the brain February 05, 2021. FINDINGS: Brain: Postsurgical changes from right craniotomy for tumor resection. Stable appearance of the area of irregular contrast enhancement in the surgical cavity in the right temporal lobe measuring approximately 2 x 1.2 centum with improvement of the surrounding T2 hyperintensity. Increased thickness and contrast enhancement subjacent to the craniotomy is stable while the diffuse right-sided dural thickening and contrast enhancement described on prior MRI has largely resolved. Again seen is a 2 mm focus of contrast enhancement within the subcortical white matter of the right parietal lobes, unchanged. No new focus  of abnormal contrast enhancement. No acute infarction, hemorrhage, hydrocephalus, or extra-axial collection. Scattered and confluent foci of T2 hyperintensity are seen within the white matter of the cerebral hemispheres, nonspecific, may be related to small vessel ischemia. Vascular: Normal flow voids. Skull and upper cervical spine: Postsurgical changes from right temporal craniotomy. Marrow signal characteristics are otherwise maintained. Sinuses/Orbits: Negative. Other: Minimal bilateral mastoid effusion. IMPRESSION: 1. Stable appearance of area of irregular contrast enhancement within the surgical cavity in the right temporal lobe with decrease T2 hyperintensity within the adjacent brain parenchyma. 2. Stable appearance of 2 mm focus of contrast enhancement in the right parietal lobe. 3. Interval near resolution of the right convexity diffusely increased thickness and contrast enhancement. Electronically Signed   By: Pedro Earls M.D.   On: 04/27/2021 20:12    [place] Clinician Interpretation: I have personally reviewed the radiological images as listed.  My interpretation, in the context of the patient's clinical presentation, is stable disease   Assessment/Plan Brain metastasis [facility]) [C79.31]  Taino Maertens is clinically and radiographically stable today.  MRI shows regression of reactive enhancement along dura within the right hemisphere.    He will meet with radiation oncology later this week to discuss treatment plan for localized lung progression.  He should remain off decadron in the interim.  If neurologic symptoms develop he will let us know.  We appreciate the opportunity to participate in the care of Finnian Husted.    We ask that Hadrian Yarbrough return to clinic in 4 months following next brain MRI, or sooner as needed.  All questions were answered. The patient knows to call the clinic with any problems, questions or concerns. No barriers to  learning were detected.  I have spent a total of 30 minutes of face-to-face and non-face-to-face time, excluding clinical staff time, preparing to see patient, ordering tests and/or medications, counseling the patient, and independently interpreting results and communicating results to the patient/family/caregiver    Ventura Sellers, MD Medical Director of Neuro-Oncology [facility] 05/13/21 10:39 AM

## 2021-05-15 ENCOUNTER — Encounter: Payer: Self-pay | Admitting: Radiation Oncology

## 2021-05-15 ENCOUNTER — Ambulatory Visit
Admission: RE | Admit: 2021-05-15 | Discharge: 2021-05-15 | Disposition: A | Discharge status: Home / Self Care | Payer: No Typology Code available for payment source | Source: Ambulatory Visit | Attending: Radiation Oncology | Admitting: Radiation Oncology

## 2021-05-15 ENCOUNTER — Other Ambulatory Visit: Payer: Self-pay

## 2021-05-15 VITALS — BP 146/64 | HR 81 | Temp 97.7°F | Resp 20 | Ht 70.0 in | Wt 234.6 lb

## 2021-05-15 DIAGNOSIS — E119 Type 2 diabetes mellitus without complications: Secondary | ICD-10-CM | POA: Diagnosis not present

## 2021-05-15 DIAGNOSIS — Z87891 Personal history of nicotine dependence: Secondary | ICD-10-CM | POA: Diagnosis not present

## 2021-05-15 DIAGNOSIS — C7931 Secondary malignant neoplasm of brain: Secondary | ICD-10-CM | POA: Insufficient documentation

## 2021-05-15 DIAGNOSIS — C3412 Malignant neoplasm of upper lobe, left bronchus or lung: Secondary | ICD-10-CM | POA: Diagnosis not present

## 2021-05-15 DIAGNOSIS — K219 Gastro-esophageal reflux disease without esophagitis: Secondary | ICD-10-CM | POA: Diagnosis not present

## 2021-05-15 DIAGNOSIS — E785 Hyperlipidemia, unspecified: Secondary | ICD-10-CM | POA: Insufficient documentation

## 2021-05-15 DIAGNOSIS — Z923 Personal history of irradiation: Secondary | ICD-10-CM | POA: Diagnosis not present

## 2021-05-15 DIAGNOSIS — Z79899 Other long term (current) drug therapy: Secondary | ICD-10-CM | POA: Diagnosis not present

## 2021-05-15 DIAGNOSIS — C7949 Secondary malignant neoplasm of other parts of nervous system: Secondary | ICD-10-CM

## 2021-05-15 NOTE — Progress Notes (Signed)
Radiation Oncology         [phone]) [phone] ________________________________  Name: Alex Cordova        MRN: [id number]  Date of Service: 05/15/2021 DOB: Jan 12, 1946  HL:KTGY, Alex Drilling, DO  Alex Potter, MD     REFERRING PHYSICIAN: Marice Potter, MD   DIAGNOSIS: The primary encounter diagnosis was Brain metastasis ([place]). A diagnosis of Malignant neoplasm of upper lobe of left lung (HCC) was also pertinent to this visit.   HISTORY OF PRESENT ILLNESS: Alex Cordova is a 75 y.o. male seen at the request of Dr. Bobby Rumpf and has a history of stage IV non-small cell adenocarcinoma of the left lung.   He was originally diagnosed in the spring 2020 with pain in the acromion which ultimately led to the work-up and diagnosis of squamous cell carcinoma felt to be arising from the left lung.  He was treated with systemic therapy and clinically did extremely well, he was found to have recurrence in the brain and was treated with preoperative SRS in May 2021.  Interestingly his surgical resection from his brain tumor in the right temporal lobe showed an adenocarcinoma consistent with non-small cell lung primary.  Dr. 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  He has continued to be followed in brain oncology conference, and in August was found to have a new lesion in the right parietal lobe that was also treated with SRS in 1 fraction.  He has since been followed in surveillance without recurrent disease in the brain.  He continues on capmatinib with Dr. Bobby Rumpf, recent imaging on 05/06/2021 showed an increase of the lesion in the left upper lobe pulmonary nodule concerning for disease progression.  No other evidence of metastatic disease was seen throughout the chest abdomen and pelvis.  The lesion in the left upper lobe measures 1.7 x 1 cm and previously was 1.2 x 0.9 cm.  He is seen to consider additional radiotaherapy and clinically has been doing quite well.   He is due for his next MRI in October 2022.    PREVIOUS RADIATION THERAPY: Yes   07/30/20 SRS Treatment: PTV2 Rt Parietal 30m, 20 Gy in 1 fraction  04/18/20 Preoperative SRS Treatment: PTV1 Right Temporal 22 mm target was treated to 15 Gy in 1 fraction    PAST MEDICAL HISTORY:  Past Medical History:  Diagnosis Date   Arthritis    Diabetes mellitus type 2 in obese ([place]    Full dentures    GERD (gastroesophageal reflux disease)    Hyperlipidemia    [place] cancer [place]    [place] cancer [place] brain ([place]    Pneumonia    Wears glasses        PAST SURGICAL HISTORY: Past Surgical History:  Procedure Laterality Date   APPLICATION OF CRANIAL NAVIGATION N/A 04/19/2020   Procedure: APPLICATION OF CRANIAL NAVIGATION;  Surgeon: CKary Kos MD;  [facility]  Service: Neurosurgery;  Laterality: N/A;  APPLICATION OF CRANIAL NAVIGATION   COLONOSCOPY W/ BIOPSIES AND POLYPECTOMY     CRANIOTOMY Right 04/19/2020   Procedure: Right Temporal stereotactic craniotomy;  Surgeon: CKary Kos MD;  [place] [facility]  Service: Neurosurgery;  Laterality: Right;   FRACTURE SURGERY     right shoulder   MULTIPLE TOOTH EXTRACTIONS       FAMILY HISTORY: No family history on file.   SOCIAL HISTORY:  reports that he has quit smoking. His smoking use included cigarettes.  He has never used smokeless tobacco. He reports previous alcohol use. He reports that he does not use drugs. The patient is married and lives in [place]. He's a retired Administrator.   ALLERGIES: Metformin and Sulfamethoxazole   MEDICATIONS:  Current Outpatient Medications  Medication Sig Dispense Refill   acetaminophen (TYLENOL) 325 MG tablet Take 650 mg by mouth every 6 (six) hours as needed.     Alogliptin Benzoate 25 MG TABS Take 0.5 tablets by mouth in the morning and at bedtime. 60 tablet 0   calcium-vitamin D (OSCAL WITH D) 500-200 MG-UNIT tablet Take 1 tablet by mouth 2 (two) times daily.     capmatinib (TABRECTA) 200 MG  tablet Take 400 mg by mouth 2 (two) times daily.     esomeprazole (NEXIUM) 40 MG capsule Take 40 mg by mouth daily at 12 noon.     furosemide (LASIX) 20 MG tablet Take 1 tablet (20 mg total) by mouth daily as needed for edema. 30 tablet 5   ibuprofen (ADVIL) 200 MG tablet Take 200 mg by mouth every 6 (six) hours as needed.     LORazepam (ATIVAN) 0.5 MG tablet 1 tab po 30 minutes prior to radiation or MRI 10 tablet 0   potassium chloride (KLOR-CON) 20 MEQ packet Take 20 mEq by mouth daily.     Psyllium (METAMUCIL PO) Take 1 Scoop by mouth daily.     No current facility-administered medications for this encounter.     REVIEW OF SYSTEMS: On review of systems, the patient reports that he is doing well. He denies any new cough, shortness of breath, fevers, chills, chest pain, or pain. He describes fatigue from his targeted therapy as well as some ongoing swelling of his arms and hands that are felt to be the result of his targeted therapy. He has occasional headaches but no progressive symptoms of headache, changes in speech or movement. No other complaints are noted.      PHYSICAL EXAM:  Wt Readings from Last 3 Encounters:  05/15/21 234 lb 9.6 oz (106.4 kg)  05/13/21 234 lb 6.4 oz (106.3 kg)  05/08/21 237 lb 11.2 oz (107.8 kg)   Temp Readings from Last 3 Encounters:  05/15/21 97.7 F (36.5 C)  05/13/21 98.1 F (36.7 C) (Oral)  05/08/21 98 F (36.7 C)   BP Readings from Last 3 Encounters:  05/15/21 (!) 146/64  05/13/21 128/79  05/08/21 (!) 164/88   Pulse Readings from Last 3 Encounters:  05/15/21 81  05/13/21 83  05/08/21 (!) 57   Pain Assessment Pain Score: 0-No pain/10  In general this is a well appearing caucasian male in no acute distress. He's alert and oriented x4 and appropriate throughout the examination. Cardiopulmonary assessment is negative for acute distress and he exhibits normal effort.     ECOG = 0  0 - Asymptomatic (Fully active, able to carry on all  predisease activities without restriction)  1 - Symptomatic but completely ambulatory (Restricted in physically strenuous activity but ambulatory and able to carry out work of a light or sedentary nature. For example, light housework, office work)  2 - Symptomatic, <50% in bed during the day (Ambulatory and capable of all self care but unable to carry out any work activities. Up and about more than 50% of waking hours)  3 - Symptomatic, >50% in bed, but not bedbound (Capable of only limited self-care, confined to bed or chair 50% or more of waking hours)  4 - Bedbound (Completely disabled. Cannot  carry on any self-care. Totally confined to bed or chair)  5 - Death   Eustace Pen MM, Creech RH, Tormey DC, et al. [phone]). "Toxicity and response criteria of the [facility]". [place] Oncol. 5 (6): 649-55    LABORATORY DATA:  Lab Results  Component Value Date   WBC 6.1 03/07/2021   HGB 15.1 03/07/2021   HCT 47 03/07/2021   MCV 89 03/07/2021   PLT 168 03/07/2021   Lab Results  Component Value Date   NA 137 03/07/2021   K 4.0 03/07/2021   CL 106 03/07/2021   CO2 26 (A) 03/07/2021   Lab Results  Component Value Date   ALT 23 03/07/2021   AST 25 03/07/2021   ALKPHOS 55 03/07/2021   BILITOT 0.7 10/08/2020      RADIOGRAPHY: MR BRAIN W WO CONTRAST  Result Date: 04/27/2021 CLINICAL DATA:  Brain/CNS neoplasm. EXAM: MRI HEAD WITHOUT AND WITH CONTRAST TECHNIQUE: Multiplanar, multiecho pulse sequences of the brain and surrounding structures were obtained without and with intravenous contrast. CONTRAST:  65m GADAVIST GADOBUTROL 1 MMOL/ML IV SOLN COMPARISON:  MRI of the brain February 05, 2021. FINDINGS: Brain: Postsurgical changes from right craniotomy for tumor resection. Stable appearance of the area of irregular contrast enhancement in the surgical cavity in the right temporal lobe measuring approximately 2 x 1.2 centum with improvement of the surrounding T2  hyperintensity. Increased thickness and contrast enhancement subjacent to the craniotomy is stable while the diffuse right-sided dural thickening and contrast enhancement described on prior MRI has largely resolved. Again seen is a 2 mm focus of contrast enhancement within the subcortical white matter of the right parietal lobes, unchanged. No new focus of abnormal contrast enhancement. No acute infarction, hemorrhage, hydrocephalus, or extra-axial collection. Scattered and confluent foci of T2 hyperintensity are seen within the white matter of the cerebral hemispheres, nonspecific, may be related to small vessel ischemia. Vascular: Normal flow voids. Skull and upper cervical spine: Postsurgical changes from right temporal craniotomy. Marrow signal characteristics are otherwise maintained. Sinuses/Orbits: Negative. Other: Minimal bilateral mastoid effusion. IMPRESSION: 1. Stable appearance of area of irregular contrast enhancement within the surgical cavity in the right temporal lobe with decrease T2 hyperintensity within the adjacent brain parenchyma. 2. Stable appearance of 2 mm focus of contrast enhancement in the right parietal lobe. 3. Interval near resolution of the right convexity diffusely increased thickness and contrast enhancement. Electronically Signed   By: KPedro EarlsM.D.   On: 04/27/2021 20:12       IMPRESSION/PLAN: 1. Stage IV, NSCLC, SCC of the LLL with metastatic disease to the acromion and metastatic adenocarcinoma felt to be lung primary to the brain, now with local progression of his LLL nodule. Dr. MLisbeth Renshawdiscusses the pathology findings and reviews the nature of locally progressive disease. His clinical course has been very different than most patients with stage IV lung cancer, likely due to his MET exon 14 mutation. He will continue taking his targeted therapy. Dr. MLisbeth Renshawreviews the rationale for stereotactic body radiotherapy to the LLL nodule given his clinical course  has been so favorable.  We discussed the risks, benefits, short, and long term effects of radiotherapy, as well as the curative intent for the lesion itself, knowing that this is not curative overall given his advanced cancer diagnosis, and the patient is interested in proceeding. Dr. MLisbeth Renshawdiscusses the delivery and logistics of radiotherapy and anticipates a course of 3-5 fractions  radiotherapy. Written consent is obtained  and placed in the chart, a copy was provided to the patient. He will simulate tomorrow morning and begin radiation the following week.  2. Brain disease. The patient will continue to be followed by Dr. Mickeal Skinner and will be due for his next MRI in October 2022. We will review this together as well during his case being presented in multidisciplinary brain oncology conference.   In a visit lasting 45 minutes, greater than 50% of the time was spent face to face discussing the patient's condition, in preparation for the discussion, and coordinating the patient's care.   The above documentation reflects my direct findings during this shared patient visit. Please see the separate note by Dr. Lisbeth Renshaw on this date for the remainder of the patient's plan of care.    Carola Rhine, [facility]   **Disclaimer: This note was dictated with voice recognition software. Similar sounding words can inadvertently be transcribed and this note may contain transcription errors which may not have been corrected upon publication of note.**

## 2021-05-16 ENCOUNTER — Encounter: Payer: Self-pay | Admitting: Oncology

## 2021-05-16 ENCOUNTER — Ambulatory Visit
Admission: RE | Admit: 2021-05-16 | Discharge: 2021-05-16 | Disposition: A | Discharge status: Home / Self Care | Payer: No Typology Code available for payment source | Source: Ambulatory Visit | Attending: Radiation Oncology | Admitting: Radiation Oncology

## 2021-05-16 DIAGNOSIS — Z510 Encounter for antineoplastic radiation therapy: Secondary | ICD-10-CM | POA: Diagnosis not present

## 2021-05-16 DIAGNOSIS — C7931 Secondary malignant neoplasm of brain: Secondary | ICD-10-CM | POA: Insufficient documentation

## 2021-05-16 DIAGNOSIS — Z87891 Personal history of nicotine dependence: Secondary | ICD-10-CM | POA: Diagnosis not present

## 2021-05-16 DIAGNOSIS — C3412 Malignant neoplasm of upper lobe, left bronchus or lung: Secondary | ICD-10-CM | POA: Insufficient documentation

## 2021-05-28 DIAGNOSIS — Z510 Encounter for antineoplastic radiation therapy: Secondary | ICD-10-CM | POA: Diagnosis not present

## 2021-05-29 ENCOUNTER — Other Ambulatory Visit: Payer: Self-pay

## 2021-05-29 ENCOUNTER — Ambulatory Visit
Admission: RE | Admit: 2021-05-29 | Discharge: 2021-05-29 | Disposition: A | Discharge status: Home / Self Care | Payer: No Typology Code available for payment source | Source: Ambulatory Visit | Attending: Radiation Oncology | Admitting: Radiation Oncology

## 2021-05-29 DIAGNOSIS — Z510 Encounter for antineoplastic radiation therapy: Secondary | ICD-10-CM | POA: Diagnosis not present

## 2021-05-30 ENCOUNTER — Ambulatory Visit: Payer: No Typology Code available for payment source | Admitting: Radiation Oncology

## 2021-06-02 ENCOUNTER — Ambulatory Visit: Payer: No Typology Code available for payment source | Admitting: Radiation Oncology

## 2021-06-03 ENCOUNTER — Encounter: Payer: Self-pay | Admitting: Radiation Oncology

## 2021-06-03 ENCOUNTER — Ambulatory Visit: Payer: No Typology Code available for payment source | Admitting: Radiation Oncology

## 2021-06-03 ENCOUNTER — Ambulatory Visit
Admission: RE | Admit: 2021-06-03 | Discharge: 2021-06-03 | Disposition: A | Discharge status: Home / Self Care | Payer: No Typology Code available for payment source | Source: Ambulatory Visit | Attending: Radiation Oncology | Admitting: Radiation Oncology

## 2021-06-03 DIAGNOSIS — Z510 Encounter for antineoplastic radiation therapy: Secondary | ICD-10-CM | POA: Diagnosis not present

## 2021-06-03 NOTE — Progress Notes (Signed)
The patient has received 2/3 planned SBRT treatments to the left lower lobe. He has tolerated this treatment well so far. He will finish up with his last fraction on Thursday. He has had some mild fatigue but has noticed persistent swelling that he's been told is realted to his targeted therapy. He is taking 40 mg Lasix per day but still has equal edema of the lower extremities and even into his left hand and arm, and right hand. He denies any shortness of breath chest pain, arm or calf pain. He has a small excoration on his posterior chest wall between the shoulder blades, and has been following the healing of this site as it was associated with a previous carbuncle per report. We will follow this expectantly and he will see Dr. Bobby Rumpf for follow up in August, but I encouraged him to let Dr. Bobby Rumpf know about his persistent edema.

## 2021-06-04 ENCOUNTER — Ambulatory Visit: Payer: No Typology Code available for payment source | Admitting: Radiation Oncology

## 2021-06-05 ENCOUNTER — Other Ambulatory Visit: Payer: Self-pay

## 2021-06-05 ENCOUNTER — Ambulatory Visit: Payer: No Typology Code available for payment source | Admitting: Radiation Oncology

## 2021-06-05 ENCOUNTER — Encounter: Payer: Self-pay | Admitting: Radiation Oncology

## 2021-06-05 ENCOUNTER — Ambulatory Visit
Admission: RE | Admit: 2021-06-05 | Discharge: 2021-06-05 | Disposition: A | Discharge status: Home / Self Care | Payer: No Typology Code available for payment source | Source: Ambulatory Visit | Attending: Radiation Oncology | Admitting: Radiation Oncology

## 2021-06-05 DIAGNOSIS — Z510 Encounter for antineoplastic radiation therapy: Secondary | ICD-10-CM | POA: Diagnosis not present

## 2021-06-06 ENCOUNTER — Ambulatory Visit: Payer: No Typology Code available for payment source | Admitting: Radiation Oncology

## 2021-06-06 NOTE — Progress Notes (Signed)
                                                                                                                                                             Patient Name: Alex Cordova MRN: [id number] DOB: 06-07-1946 Referring Physician: Alcus Dad (Profile Not Attached) Date of Service: 06/05/2021 [facility], [place]                                                        End Of Treatment Note  Diagnoses: C34.12-Malignant neoplasm of upper lobe, left bronchus or lung C79.31-Secondary malignant neoplasm of brain  Cancer Staging: Stage IV, NSCLC, SCC of the LLL with metastatic disease to the acromion and metastatic adenocarcinoma felt to be lung primary to the brain, now with local progression of his LLL nodule  Intent: Curative  Radiation Treatment Dates: 05/29/2021 through 06/05/2021 SBRT Site Technique Total Dose (Gy) Dose per Fx (Gy) Completed Fx Beam Energies  Lung, Left: Lung_Lt IMRT 54/54 18 3/3 6XFFF   Narrative: The patient tolerated radiation therapy relatively well.   Plan: The patient will receive a call in about one month from the radiation oncology department. He will continue follow up with Dr. Mickeal Skinner and Dr. Bobby Rumpf as well.   ________________________________________________    Carola Rhine, [facility]

## 2021-06-10 ENCOUNTER — Ambulatory Visit: Payer: No Typology Code available for payment source | Admitting: Radiation Oncology

## 2021-06-11 ENCOUNTER — Ambulatory Visit: Payer: No Typology Code available for payment source | Admitting: Radiation Oncology

## 2021-06-12 ENCOUNTER — Ambulatory Visit: Payer: No Typology Code available for payment source | Admitting: Radiation Oncology

## 2021-06-26 DIAGNOSIS — R112 Nausea with vomiting, unspecified: Secondary | ICD-10-CM | POA: Diagnosis not present

## 2021-06-26 DIAGNOSIS — J111 Influenza due to unidentified influenza virus with other respiratory manifestations: Secondary | ICD-10-CM | POA: Diagnosis not present

## 2021-06-26 DIAGNOSIS — R5383 Other fatigue: Secondary | ICD-10-CM | POA: Diagnosis not present

## 2021-06-26 DIAGNOSIS — R0602 Shortness of breath: Secondary | ICD-10-CM | POA: Diagnosis not present

## 2021-06-26 DIAGNOSIS — M791 Myalgia, unspecified site: Secondary | ICD-10-CM | POA: Diagnosis not present

## 2021-06-26 DIAGNOSIS — Z20828 Contact with and (suspected) exposure to other viral communicable diseases: Secondary | ICD-10-CM | POA: Diagnosis not present

## 2021-06-26 DIAGNOSIS — R509 Fever, unspecified: Secondary | ICD-10-CM | POA: Diagnosis not present

## 2021-06-26 DIAGNOSIS — R059 Cough, unspecified: Secondary | ICD-10-CM | POA: Diagnosis not present

## 2021-07-08 ENCOUNTER — Inpatient Hospital Stay (INDEPENDENT_AMBULATORY_CARE_PROVIDER_SITE_OTHER): Payer: No Typology Code available for payment source | Admitting: Hematology and Oncology

## 2021-07-08 ENCOUNTER — Inpatient Hospital Stay: Payer: No Typology Code available for payment source | Attending: Internal Medicine

## 2021-07-08 ENCOUNTER — Other Ambulatory Visit: Payer: Self-pay | Admitting: Hematology and Oncology

## 2021-07-08 ENCOUNTER — Other Ambulatory Visit: Payer: Self-pay

## 2021-07-08 ENCOUNTER — Telehealth: Payer: Self-pay | Admitting: Hematology and Oncology

## 2021-07-08 ENCOUNTER — Encounter: Payer: Self-pay | Admitting: Hematology and Oncology

## 2021-07-08 VITALS — BP 180/84 | HR 58 | Temp 97.6°F | Resp 18 | Ht 70.5 in | Wt 234.6 lb

## 2021-07-08 DIAGNOSIS — C3412 Malignant neoplasm of upper lobe, left bronchus or lung: Secondary | ICD-10-CM | POA: Diagnosis not present

## 2021-07-08 LAB — COMPREHENSIVE METABOLIC PANEL
Albumin: 3.2 — AB (ref 3.5–5.0)
Calcium: 8.6 — AB (ref 8.7–10.7)

## 2021-07-08 LAB — BASIC METABOLIC PANEL
BUN: 12 (ref 4–21)
CO2: 27 — AB (ref 13–22)
Chloride: 106 (ref 99–108)
Creatinine: 1.1 (ref 0.6–1.3)
Glucose: 102
Potassium: 4.1 (ref 3.4–5.3)
Sodium: 138 (ref 137–147)

## 2021-07-08 LAB — CBC AND DIFFERENTIAL
HCT: 46 (ref 41–53)
Hemoglobin: 15.6 (ref 13.5–17.5)
Neutrophils Absolute: 3.36
Platelets: 176 (ref 150–399)
WBC: 5.6

## 2021-07-08 LAB — HEPATIC FUNCTION PANEL
ALT: 35 (ref 10–40)
AST: 25 (ref 14–40)
Alkaline Phosphatase: 57 (ref 25–125)
Bilirubin, Total: 0.7

## 2021-07-08 LAB — CBC: RBC: 5.16 — AB (ref 3.87–5.11)

## 2021-07-08 MED ORDER — CAPMATINIB HCL 200 MG PO TABS: 400.0000 mg | ORAL_TABLET | Freq: Two times a day (BID) | ORAL | 3 refills | Status: DC

## 2021-07-08 NOTE — Progress Notes (Signed)
[facility]  [address] [place],  [place]  [address] [phone]  Clinic Day:  05/08/2021  Referring physician: Caralyn Guile, DO   HISTORY OF PRESENT ILLNESS:  The patient is a 75 y.o. male with metastatic non-small cell lung cancer, which includes brain metastasis and a metastatic focus of disease to his left acromion process.  His initial lung cancer pathology showed squamous cell carcinoma.  However, pathology from his brain resection done earlier this year showed adenocarcinoma. 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   Since his last visit, he has completed stereotactic radiation in [place] to his lung. He tolerated this well. He will follow up with them in approximately one month. He continues capmatinib twice daily and is tolerating well. He denies fever, chills, nausea or vomiting. He has a chronic nonproductive cough. He denies shortness of breath or chest pain. He denies issue with bowel or bladder. CBC and CMP are unremarkable today other than a decreased albumin and protein.  PHYSICAL EXAM:  Blood pressure (!) 180/84, pulse (!) 58, temperature 97.6 F (36.4 C), temperature source Oral, resp. rate 18, height 5' 10.5" (1.791 m), weight 234 lb 9.6 oz (106.4 kg), SpO2 95 %. Wt Readings from Last 3 Encounters:  07/08/21 234 lb 9.6 oz (106.4 kg)  05/15/21 234 lb 9.6 oz (106.4 kg)  05/13/21 234 lb 6.4 oz (106.3 kg)   Body mass index is 33.19 kg/m. Performance status: 1 Physical Exam Constitutional:      Appearance: Normal appearance. He is not ill-appearing.  HENT:     Mouth/Throat:     Mouth: Mucous membranes are moist.     Pharynx: Oropharynx is clear. No oropharyngeal exudate or posterior oropharyngeal erythema.  Cardiovascular:     Rate and Rhythm: Normal rate and regular rhythm.     Heart sounds: No murmur heard.   No friction  rub. No gallop.  Pulmonary:     Effort: Pulmonary effort is normal. No respiratory distress.     Breath sounds: Normal breath sounds. No wheezing, rhonchi or rales.  Chest:  Breasts:    Right: No axillary adenopathy or supraclavicular adenopathy.     Left: No axillary adenopathy or supraclavicular adenopathy.  Abdominal:     General: Bowel sounds are normal. There is no distension.     Palpations: Abdomen is soft. There is no mass.     Tenderness: There is no abdominal tenderness.  Musculoskeletal:        General: No swelling.     Right lower leg: No edema.     Left lower leg: No edema.  Lymphadenopathy:     Cervical: No cervical adenopathy.     Upper Body:     Right upper body: No supraclavicular or axillary adenopathy.     Left upper body: No supraclavicular or axillary adenopathy.     Lower Body: No right inguinal adenopathy. No left inguinal adenopathy.  Skin:    General: Skin is warm.     Coloration: Skin is not jaundiced.     Findings: No lesion or rash.  Neurological:     General: No focal deficit present.     Mental Status: He is alert and oriented to person, place, and time. Mental status is at baseline.     Cranial Nerves: Cranial nerves are intact.  Psychiatric:        Mood  and Affect: Mood normal.        Behavior: Behavior normal.        Thought Content: Thought content normal.     ASSESSMENT & PLAN:  Assessment/Plan:  A 75 y.o. 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  His initial biopsy revealed squamous cell carcinoma, but his metastatic temporal lobe lesion came back showing adenocarcinoma. He completed stereotactic radiation to the lung and continues daily capmatinib. I will refill this today. We discussed increasing protein in his diet. He will continue to follow with neuro and radiation oncology. We will see him back in 2 months for repeat evaluation.   Both he and his wife verbalize understanding  of and agreement to the plans discussed today. They know to call the office should any new questions or concerns arise.   Dayton Scrape, FNP- [facility]

## 2021-07-08 NOTE — Telephone Encounter (Signed)
Per 8/2 LOS, patient scheduled for Oct Appt's.  Gave patient Appt Summary

## 2021-07-14 ENCOUNTER — Ambulatory Visit
Admission: RE | Admit: 2021-07-14 | Discharge: 2021-07-14 | Disposition: A | Discharge status: Home / Self Care | Payer: No Typology Code available for payment source | Source: Ambulatory Visit | Attending: Internal Medicine | Admitting: Internal Medicine

## 2021-07-14 ENCOUNTER — Other Ambulatory Visit: Payer: Self-pay

## 2021-07-14 DIAGNOSIS — C3412 Malignant neoplasm of upper lobe, left bronchus or lung: Secondary | ICD-10-CM

## 2021-07-14 DIAGNOSIS — C7931 Secondary malignant neoplasm of brain: Secondary | ICD-10-CM

## 2021-07-14 NOTE — Progress Notes (Signed)
  Radiation Oncology         [phone]) [phone] ________________________________  Name: Alex Cordova MRN: [id number]  Date of Service: 07/14/2021  DOB: Jun 23, 1946  Post Treatment Telephone Note  Diagnosis:   Stage IV, NSCLC, SCC of the LLL with metastatic disease to the acromion and metastatic adenocarcinoma felt to be lung primary to the brain, now with local progression of his LLL nodule  Interval Since Last Radiation:  6 weeks  05/29/2021 through 06/05/2021 SBRT Site Technique Total Dose (Gy) Dose per Fx (Gy) Completed Fx Beam Energies  Lung, Left: Lung_Lt IMRT 54/54 18 3/3 6XFFF    Narrative:  The patient was contacted today for routine follow-up. During treatment he did very well with radiotherapy and did not have significant desquamation.   Impression/Plan: 1. Stage IV, NSCLC, SCC of the LLL with metastatic disease to the acromion and metastatic adenocarcinoma felt to be lung primary to the brain, now with local progression of his LLL nodule. I called the patient but was unable to reach him and I left a voicemail. On the message I discussed that we would be happy to continue to follow him as needed, but he will also continue to follow up with Dr. Bobby Rumpf and Dr. Mickeal Skinner in medical and neuro oncology.      Carola Rhine, PAC

## 2021-08-03 DIAGNOSIS — Z20828 Contact with and (suspected) exposure to other viral communicable diseases: Secondary | ICD-10-CM | POA: Diagnosis not present

## 2021-08-03 DIAGNOSIS — R051 Acute cough: Secondary | ICD-10-CM | POA: Diagnosis not present

## 2021-08-03 DIAGNOSIS — J029 Acute pharyngitis, unspecified: Secondary | ICD-10-CM | POA: Diagnosis not present

## 2021-08-03 DIAGNOSIS — R0981 Nasal congestion: Secondary | ICD-10-CM | POA: Diagnosis not present

## 2021-08-03 DIAGNOSIS — R519 Headache, unspecified: Secondary | ICD-10-CM | POA: Diagnosis not present

## 2021-08-27 ENCOUNTER — Telehealth: Payer: Self-pay

## 2021-08-27 NOTE — Telephone Encounter (Signed)
Pt's wife LVM on triage line req to speak with Dr Bobby Rumpf' nurse. She wants to know when [facility]'s CT will be done?

## 2021-08-28 ENCOUNTER — Other Ambulatory Visit: Payer: Self-pay | Admitting: Oncology

## 2021-08-28 ENCOUNTER — Telehealth: Payer: Self-pay

## 2021-08-28 DIAGNOSIS — C3412 Malignant neoplasm of upper lobe, left bronchus or lung: Secondary | ICD-10-CM

## 2021-08-28 NOTE — Telephone Encounter (Signed)
error 

## 2021-09-01 ENCOUNTER — Telehealth: Payer: Self-pay | Admitting: Oncology

## 2021-09-01 NOTE — Progress Notes (Signed)
[facility]  [address] [place],  [place]  [address] [phone]  Clinic Day:  09/08/2021  Referring physician: Caralyn Guile, DO  This document serves as a record of services personally performed by Marice Potter, MD. It was created on their behalf by Curry,Lauren E, a trained medical scribe. The creation of this record is based on the scribe's personal observations and the provider's statements to them.  HISTORY OF PRESENT ILLNESS:  The patient is a 75 y.o. male with metastatic non-small cell lung cancer, which includes brain metastasis and a metastatic focus of disease to his left acromion process.  His initial lung cancer pathology showed squamous cell carcinoma.  However, pathology from his brain resection done earlier this year showed adenocarcinoma. 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 He has also undergone stereotactic radiation in [place] to his left lung mass. He comes in today for routine follow up, as well as to review his recent CT scans.  Since his last visit, the patient has been doing well.  He denies having any new systemic symptoms which concern him for disease progression.  He has minor fluid retention in his legs from his capmatinib therapy, but this has been very manageable.   PHYSICAL EXAM:  Blood pressure (!) 184/82, pulse (!) 56, temperature 98 F (36.7 C), resp. rate 16, height 5' 10.5" (1.791 m), weight 239 lb 4.8 oz (108.5 kg), SpO2 96 %. Wt Readings from Last 3 Encounters:  09/08/21 239 lb 4.8 oz (108.5 kg)  07/08/21 234 lb 9.6 oz (106.4 kg)  05/15/21 234 lb 9.6 oz (106.4 kg)   Body mass index is 33.85 kg/m. Performance status: 1 Physical Exam Constitutional:      Appearance: Normal appearance. He is not ill-appearing.  HENT:     Mouth/Throat:     Mouth: Mucous membranes are moist.     Pharynx: Oropharynx is  clear. No oropharyngeal exudate or posterior oropharyngeal erythema.  Cardiovascular:     Rate and Rhythm: Normal rate and regular rhythm.     Heart sounds: No murmur heard.   No friction rub. No gallop.  Pulmonary:     Effort: Pulmonary effort is normal. No respiratory distress.     Breath sounds: Normal breath sounds. No wheezing, rhonchi or rales.  Abdominal:     General: Bowel sounds are normal. There is no distension.     Palpations: Abdomen is soft. There is no mass.     Tenderness: There is no abdominal tenderness.  Musculoskeletal:        General: No swelling.     Right lower leg: No edema.     Left lower leg: No edema.  Lymphadenopathy:     Cervical: No cervical adenopathy.     Upper Body:     Right upper body: No supraclavicular or axillary adenopathy.     Left upper body: No supraclavicular or axillary adenopathy.     Lower Body: No right inguinal adenopathy. No left inguinal adenopathy.  Skin:    General: Skin is warm.     Coloration: Skin is not jaundiced.     Findings: No lesion or rash.  Neurological:     General: No focal deficit present.     Mental Status: He is alert and oriented to person, place, and time. Mental status is at baseline.     Cranial Nerves: Cranial  nerves are intact.  Psychiatric:        Mood and Affect: Mood normal.        Behavior: Behavior normal.        Thought Content: Thought content normal.   SCANS:  CT scans of her chest/abdomen/pelvis revealed the following: FINDINGS:  CT CHEST FINDINGS  Cardiovascular: No acute findings. Aortic and coronary  atherosclerotic calcification noted.  Mediastinum/Lymph Nodes: No masses or pathologically enlarged lymph  nodes identified on this unenhanced exam.  Lungs/Pleura: Pulmonary nodule is again seen in the posterior left  upper lobe abutting the fissure. This measures 1.4 x 1.2 cm on image  41/4, without significant change since previous study, although it  is margins appear less well-defined.  Increased surrounding  ground-glass opacity is seen with a new 7 mm nodule in the adjacent  superior left lower lobe, suspicious for radiation changes.  Musculoskeletal: No suspicious bone lesions identified.  CT ABDOMEN AND PELVIS FINDINGS  Hepatobiliary: No masses visualized on this unenhanced exam.  Gallbladder is unremarkable. No evidence of biliary ductal  dilatation.  Pancreas: No mass or inflammatory changes identified on this  unenhanced exam.  Spleen: Within normal limits in size.  Adrenals/Urinary Tract: No evidence of urolithiasis or hydronephrosis. Unremarkable appearance of bladder.  Stomach/Bowel: No evidence of obstruction, inflammatory process, or  abnormal fluid collections. Diverticulosis is seen mainly involving  the descending and sigmoid colon, however there is no evidence of  diverticulitis.  Vascular/Lymphatic: No pathologically enlarged lymph nodes  identified. No abdominal aortic aneurysm. Aortic atherosclerotic  calcification noted.  Reproductive: No masses or other significant abnormality.  Other: None.  Musculoskeletal: No suspicious bone lesions identified.   IMPRESSION:  Stable size of 1.4 cm posterior left upper lobe pulmonary nodule.  Increased surrounding ground-glass opacity and new 7 mm nodule in  adjacent superior left lower lobe are suspicious for radiation  changes. Recommend clinical correlation for radiation therapy, and  recommend short-term follow-up by CT in 3 months if patient is not  undergoing or has not undergone recent radiation therapy.  No evidence of abdominal or pelvic metastatic disease.  Colonic diverticulosis, without radiographic evidence of  diverticulitis.  Aortic Atherosclerosis (ICD10-I70.0).   ASSESSMENT & PLAN:  Assessment/Plan:  A 75 y.o. 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  His initial biopsy revealed squamous cell carcinoma, but his metastatic  temporal lobe lesion came back showing adenocarcinoma. In clinic today, I went over all of his CT scan images, for which he could see that his disease remains under ideal control.  There remain no new sites of disease.  He brings to my attention that he is scheduled for his next brain MRI later this week in [place].  Clinically, this gentleman is doing very well.  I will see him back in 2 months for repeat assessment.  The patient understands all the plans discussed today and is in agreement with them.     I, Rita Ohara, am acting as scribe for Marice Potter, MD    I have reviewed this report as typed by the medical scribe, and it is complete and accurate.  Dequincy Macarthur Critchley, MD

## 2021-09-01 NOTE — Telephone Encounter (Signed)
Patient notified of scheduled 9/27 Labs, CT Scan to check in at [facility] at 9:30 am - Gave patient Instructions/Pick Up Contrast today before 4:30 pm

## 2021-09-02 ENCOUNTER — Encounter: Payer: Self-pay | Admitting: Hematology and Oncology

## 2021-09-02 ENCOUNTER — Inpatient Hospital Stay: Payer: No Typology Code available for payment source | Attending: Internal Medicine | Admitting: Hematology and Oncology

## 2021-09-02 ENCOUNTER — Other Ambulatory Visit: Payer: Self-pay

## 2021-09-02 DIAGNOSIS — R609 Edema, unspecified: Secondary | ICD-10-CM | POA: Diagnosis not present

## 2021-09-02 DIAGNOSIS — T7840XA Allergy, unspecified, initial encounter: Secondary | ICD-10-CM

## 2021-09-02 DIAGNOSIS — T508X5A Adverse effect of diagnostic agents, initial encounter: Secondary | ICD-10-CM | POA: Diagnosis not present

## 2021-09-02 NOTE — Progress Notes (Signed)
[facility]  [address] [place],  [place]  [address] [phone]  Clinic Day:  09/02/2021  Referring physician: Caralyn Guile, DO  ASSESSMENT & PLAN:   Assessment & Plan: Allergic reaction Presents to clinic with complaints of swelling to lips and tongue with rash. This noted to start last night and has increased in severity this morning. Of note, he did drink oral contrast last night at 8 pm for CT imaging scheduled today. He is hypertensive upon presentation, but appears calm without distress. He states no previous reaction to contrast. We will send him to ED for management. I have spoken with the ED physician and charge nurse. They will see him immediately upon arrival. He was transported via wheelchair per Georgette Shell, RN. He will attempt CT imaging once stabilized and keep scheduled follow up with Dr. Bobby Rumpf.   The patient understands the plans discussed today and is in agreement with them.  He knows to contact our office if he develops concerns prior to his next appointment.     Melodye Ped, NP  [facility] [address] [place] [address] Dept: [phone] Dept Fax: [phone]   No orders of the defined types were placed in this encounter.     CHIEF COMPLAINT:  CC: A 75 year old male with history of lung cancer,  mets to brain here for scheduled CT imaging and new onset swelling of lips and tongue.  Current Treatment:  Surveillance   HISTORY OF PRESENT ILLNESS:   Oncology History   No history exists.  The patient is a 75 y.o. male with metastatic non-small cell lung cancer, which includes brain metastasis and a metastatic focus of disease to his left acromion process.  His initial lung cancer pathology showed squamous cell carcinoma.  However, pathology from his brain resection done earlier this year showed adenocarcinoma. 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   INTERVAL HISTORY:  Pepe is here today for symptom management. He drank his oral contrast for CT last night and this morning has developed swelling of lips and tongue. He denies previous experiences with CT contrast. He has minimal shortness of breath, denies chest pain or cough. He denies fever, chills, nausea or vomiting. He denies issue with bowel or bladder. We will send to ED for management.  REVIEW OF SYSTEMS:  Review of Systems  Constitutional:  Negative for appetite change, chills, diaphoresis, fatigue, fever and unexpected weight change.  HENT:   Negative for hearing loss, lump/mass, mouth sores, nosebleeds, sore throat, tinnitus, trouble swallowing and voice change.        Swelling of lips and tongue.  Eyes:  Negative for eye problems and icterus.  Respiratory:  Positive for shortness of breath. Negative for chest tightness, cough, hemoptysis and wheezing.   Cardiovascular:  Negative for chest pain, leg swelling and palpitations.  Gastrointestinal:  Negative for abdominal distention, abdominal pain, blood in stool, constipation, diarrhea, nausea, rectal pain and vomiting.  Endocrine: Negative for hot flashes.  Genitourinary:  Negative for bladder incontinence, difficulty urinating, dyspareunia, dysuria, frequency, hematuria and nocturia.   Musculoskeletal:  Negative for arthralgias, back pain, flank pain, gait problem, myalgias, neck pain and neck stiffness.  Skin:  Negative for itching, rash and wound.  Neurological:  Negative for dizziness, extremity weakness, gait problem, headaches, light-headedness, numbness, seizures  and speech difficulty.  Hematological:  Negative for adenopathy. Does not bruise/bleed easily.  Psychiatric/Behavioral:  Negative for confusion, decreased concentration, depression, sleep disturbance and suicidal ideas. The patient is not nervous/anxious.      VITALS:  Blood pressure (!) 144/95, pulse 93, resp. rate 18, SpO2 95 %.  Wt Readings from Last 3 Encounters:  07/08/21 234 lb 9.6 oz (106.4 kg)  05/15/21 234 lb 9.6 oz (106.4 kg)  05/13/21 234 lb 6.4 oz (106.3 kg)    There is no height or weight on file to calculate BMI.  Performance status (ECOG): 1 - Symptomatic but completely ambulatory  PHYSICAL EXAM:  Physical Exam Constitutional:      General: He is not in acute distress.    Appearance: Normal appearance. He is normal weight. He is not ill-appearing, toxic-appearing or diaphoretic.  HENT:     Head: Normocephalic and atraumatic.     Nose: Nose normal. No congestion or rhinorrhea.     Mouth/Throat:     Mouth: Mucous membranes are moist.     Pharynx: Oropharynx is clear. No oropharyngeal exudate or posterior oropharyngeal erythema.     Comments: Swelling of lips and tongue with speech difficulty Eyes:     General: No scleral icterus.       Right eye: No discharge.        Left eye: No discharge.     Extraocular Movements: Extraocular movements intact.     Conjunctiva/sclera: Conjunctivae normal.     Pupils: Pupils are equal, round, and reactive to light.  Neck:     Vascular: No carotid bruit.  Cardiovascular:     Rate and Rhythm: Normal rate and regular rhythm.     Heart sounds: No murmur heard.   No friction rub. No gallop.  Pulmonary:     Effort: Pulmonary effort is normal. No respiratory distress.     Breath sounds: Normal breath sounds. No stridor. No wheezing, rhonchi or rales.  Chest:     Chest wall: No tenderness.  Abdominal:     General: Abdomen is flat. Bowel sounds are normal. There is no distension.     Palpations: There is no mass.     Tenderness: There is no abdominal tenderness. There is no right CVA tenderness, left CVA tenderness, guarding or rebound.     Hernia: No hernia is present.  Musculoskeletal:        General: No swelling, tenderness, deformity or signs of injury. Normal range of motion.      Cervical back: Normal range of motion and neck supple. No rigidity or tenderness.     Right lower leg: No edema.     Left lower leg: No edema.  Lymphadenopathy:     Cervical: No cervical adenopathy.  Skin:    General: Skin is warm and dry.     Capillary Refill: Capillary refill takes less than 2 seconds.     Coloration: Skin is not jaundiced or pale.     Findings: No bruising, erythema, lesion or rash.  Neurological:     General: No focal deficit present.     Mental Status: He is alert and oriented to person, place, and time. Mental status is at baseline.     Cranial Nerves: No cranial nerve deficit.     Sensory: No sensory deficit.     Motor: No weakness.     Coordination: Coordination normal.     Gait: Gait normal.     Deep Tendon Reflexes: Reflexes normal.  Psychiatric:  Mood and Affect: Mood normal.        Behavior: Behavior normal.        Thought Content: Thought content normal.        Judgment: Judgment normal.    LABS:   CBC Latest Ref Rng & Units 07/08/2021 03/07/2021 02/07/2021  WBC - 5.6 6.1 6.5  Hemoglobin 13.5 - 17.5 15.6 15.1 15.0  Hematocrit 41 - 53 46 47 43  Platelets 150 - 399 176 168 158   CMP Latest Ref Rng & Units 07/08/2021 03/07/2021 02/07/2021  Glucose 70 - 99 mg/dL - - -  BUN 4 - _0 Creatinine 0.6 - 1.3 1.1 1.2 1.1  Sodium 137 - 147 138 137 136(A)  Potassium 3.4 - 5.3 4.1 4.0 4.1  Chloride 99 - 108 106 106 106  CO2 13 - 22 27(A) 26(A) 23(A)  Calcium 8.7 - 10.7 8.6(A) 8.3(A) 8.4(A)  Total Protein 6.5 - 8.1 g/dL - - -  Total Bilirubin 0.3 - 1.2 mg/dL - - -  Alkaline Phos 25 - 125 57 55 54  AST 14 - 40 25 25 9(A)  ALT 10 - 40 35 23 21     No results found for: CEA1 / No results found for: CEA1 No results found for: PSA1 No results found for: VQM086 No results found for: PYP950  No results found for: TOTALPROTELP, ALBUMINELP, A1GS, A2GS, BETS, BETA2SER, GAMS, MSPIKE, SPEI No results found for: TIBC, FERRITIN, IRONPCTSAT No results  found for: LDH  STUDIES:  No results found.    HISTORY:   Past Medical History:  Diagnosis Date   Arthritis    Diabetes mellitus type 2 in obese (HCC)    Full dentures    GERD (gastroesophageal reflux disease)    Hyperlipidemia    Lung cancer ([place])    Metastatic cancer to brain ([facility] [facility])    Pneumonia    Wears glasses     Past Surgical History:  Procedure Laterality Date   APPLICATION OF CRANIAL NAVIGATION N/A 04/19/2020   Procedure: APPLICATION OF CRANIAL NAVIGATION;  Surgeon: Kary Kos, MD;  Location: [facility];  Service: Neurosurgery;  Laterality: N/A;  APPLICATION OF CRANIAL NAVIGATION   COLONOSCOPY W/ BIOPSIES AND POLYPECTOMY     CRANIOTOMY Right 04/19/2020   Procedure: Right Temporal stereotactic craniotomy;  Surgeon: Kary Kos, MD;  Location: [facility];  Service: Neurosurgery;  Laterality: Right;   FRACTURE SURGERY     right shoulder   MULTIPLE TOOTH EXTRACTIONS      History reviewed. No pertinent family history.  Social History:  reports that he has quit smoking. His smoking use included cigarettes. He has never used smokeless tobacco. He reports that he does not currently use alcohol. He reports that he does not use drugs.The patient is accompanied by wife today.  Allergies:  Allergies  Allergen Reactions   Metformin Diarrhea   Sulfamethoxazole Nausea Only    Childhood allergy    Current Medications: Current Outpatient Medications  Medication Sig Dispense Refill   acetaminophen (TYLENOL) 325 MG tablet Take 650 mg by mouth every 6 (six) hours as needed.     Alogliptin Benzoate 25 MG TABS Take 0.5 tablets by mouth in the morning and at bedtime. 60 tablet 0   calcium-vitamin D (OSCAL WITH D) 500-200 MG-UNIT tablet Take 1 tablet by mouth 2 (two) times daily.     capmatinib (TABRECTA) 200 MG tablet Take 2 tablets (400 mg total) by mouth 2 (two) times daily. 120 tablet 3  esomeprazole (NEXIUM) 40 MG capsule Take 40 mg by mouth daily at 12 noon.     furosemide (LASIX) 20 MG  tablet Take 1 tablet (20 mg total) by mouth daily as needed for edema. 30 tablet 5   ibuprofen (ADVIL) 200 MG tablet Take 200 mg by mouth every 6 (six) hours as needed.     LORazepam (ATIVAN) 0.5 MG tablet 1 tab po 30 minutes prior to radiation or MRI 10 tablet 0   potassium chloride (KLOR-CON) 20 MEQ packet Take 20 mEq by mouth daily.     Psyllium (METAMUCIL PO) Take 1 Scoop by mouth daily.     No current facility-administered medications for this visit.

## 2021-09-02 NOTE — Assessment & Plan Note (Signed)
Presents to clinic with complaints of swelling to lips and tongue with rash. This noted to start last night and has increased in severity this morning. Of note, he did drink oral contrast last night at 8 pm for CT imaging scheduled today. He is hypertensive upon presentation, but appears calm without distress. He states no previous reaction to contrast. We will send him to ED for management. I have spoken with the ED physician and charge nurse. They will see him immediately upon arrival. He was transported via wheelchair per Georgette Shell, RN. He will attempt CT imaging once stabilized and keep scheduled follow up with Dr. Bobby Rumpf.

## 2021-09-02 NOTE — Progress Notes (Signed)
Pt presented to the Cancer center with tongue swelling, facial swelling, speech impediment. Took oral contrast last night, symptoms of allergic reaction started @ 6:30 to 7am this morning, symptoms improved then second episode started around 7:45am.  Instead of going straight to ED patient spouse brought him here for evaluation.  Pt brought to exam room 6, VS took, documented under rooming tab.   Melissa P. NP spoke with patient and the recommendation was to transport to ED.  Pt took to ED via wheelchair by Adonis Huguenin, Therapist, sports.  No problems occurred during transport.  Pt was left in Rm 2C with Larry Sierras.

## 2021-09-04 ENCOUNTER — Other Ambulatory Visit: Payer: Self-pay | Admitting: Radiation Therapy

## 2021-09-05 ENCOUNTER — Telehealth: Payer: Self-pay | Admitting: Internal Medicine

## 2021-09-05 NOTE — Telephone Encounter (Signed)
Called patient regarding October appointments, voicemail was left.

## 2021-09-08 ENCOUNTER — Inpatient Hospital Stay (INDEPENDENT_AMBULATORY_CARE_PROVIDER_SITE_OTHER): Payer: No Typology Code available for payment source | Admitting: Oncology

## 2021-09-08 ENCOUNTER — Other Ambulatory Visit: Payer: Self-pay | Admitting: Radiation Therapy

## 2021-09-08 ENCOUNTER — Other Ambulatory Visit: Payer: Self-pay | Admitting: Hematology and Oncology

## 2021-09-08 ENCOUNTER — Inpatient Hospital Stay: Payer: No Typology Code available for payment source | Attending: Internal Medicine

## 2021-09-08 VITALS — BP 184/82 | HR 56 | Temp 98.0°F | Resp 16 | Ht 70.5 in | Wt 239.3 lb

## 2021-09-08 DIAGNOSIS — C7951 Secondary malignant neoplasm of bone: Secondary | ICD-10-CM

## 2021-09-08 DIAGNOSIS — C3412 Malignant neoplasm of upper lobe, left bronchus or lung: Secondary | ICD-10-CM | POA: Diagnosis not present

## 2021-09-08 DIAGNOSIS — G40909 Epilepsy, unspecified, not intractable, without status epilepticus: Secondary | ICD-10-CM | POA: Diagnosis not present

## 2021-09-08 DIAGNOSIS — E119 Type 2 diabetes mellitus without complications: Secondary | ICD-10-CM | POA: Insufficient documentation

## 2021-09-08 DIAGNOSIS — E785 Hyperlipidemia, unspecified: Secondary | ICD-10-CM | POA: Insufficient documentation

## 2021-09-08 DIAGNOSIS — C7952 Secondary malignant neoplasm of bone marrow: Secondary | ICD-10-CM | POA: Diagnosis not present

## 2021-09-08 DIAGNOSIS — C7931 Secondary malignant neoplasm of brain: Secondary | ICD-10-CM | POA: Insufficient documentation

## 2021-09-08 DIAGNOSIS — Z87891 Personal history of nicotine dependence: Secondary | ICD-10-CM | POA: Insufficient documentation

## 2021-09-08 DIAGNOSIS — Z79899 Other long term (current) drug therapy: Secondary | ICD-10-CM | POA: Diagnosis not present

## 2021-09-08 DIAGNOSIS — C3490 Malignant neoplasm of unspecified part of unspecified bronchus or lung: Secondary | ICD-10-CM | POA: Diagnosis present

## 2021-09-08 LAB — CBC AND DIFFERENTIAL
HCT: 47 (ref 41–53)
Hemoglobin: 15.4 (ref 13.5–17.5)
Neutrophils Absolute: 4.71
Platelets: 158 (ref 150–399)
WBC: 7.6

## 2021-09-08 LAB — CBC
Calcium, Corrected: 9.7
MCV: 90 (ref 80–94)
RBC: 5.23 — AB (ref 3.87–5.11)

## 2021-09-08 LAB — COMPREHENSIVE METABOLIC PANEL
Albumin: 3.2 — AB (ref 3.5–5.0)
Calcium: 8.9 (ref 8.7–10.7)

## 2021-09-08 LAB — BASIC METABOLIC PANEL
BUN: 18 (ref 4–21)
CO2: 29 — AB (ref 13–22)
Chloride: 102 (ref 99–108)
Creatinine: 1.3 (ref 0.6–1.3)
Glucose: 104
Potassium: 3.7 (ref 3.4–5.3)
Sodium: 136 — AB (ref 137–147)

## 2021-09-08 LAB — HEPATIC FUNCTION PANEL
ALT: 19 (ref 10–40)
AST: 24 (ref 14–40)
Alkaline Phosphatase: 53 (ref 25–125)
Bilirubin, Total: 0.6

## 2021-09-08 LAB — PROTEIN, TOTAL: Total Protein: 5.7 g/dL — AB (ref 6.3–8.2)

## 2021-09-08 MED ORDER — HEPARIN SOD (PORK) LOCK FLUSH 100 UNIT/ML IV SOLN
500.0000 [IU] | Freq: Once | INTRAVENOUS | Status: AC | PRN
  Administered 2021-09-08: 500 [IU]

## 2021-09-08 MED ORDER — SODIUM CHLORIDE 0.9% FLUSH
10.0000 mL | INTRAVENOUS | Status: AC | PRN
  Administered 2021-09-08: 10 mL

## 2021-09-08 NOTE — Addendum Note (Signed)
Addended byGeorgette Shell on: 09/08/2021 11:05 AM   Modules accepted: Orders

## 2021-09-11 ENCOUNTER — Other Ambulatory Visit: Payer: Self-pay | Admitting: *Deleted

## 2021-09-11 DIAGNOSIS — Z95828 Presence of other vascular implants and grafts: Secondary | ICD-10-CM

## 2021-09-12 ENCOUNTER — Other Ambulatory Visit: Payer: Self-pay

## 2021-09-12 ENCOUNTER — Ambulatory Visit
Admission: RE | Admit: 2021-09-12 | Discharge: 2021-09-12 | Disposition: A | Discharge status: Home / Self Care | Payer: No Typology Code available for payment source | Source: Ambulatory Visit | Attending: Internal Medicine | Admitting: Internal Medicine

## 2021-09-12 DIAGNOSIS — C801 Malignant (primary) neoplasm, unspecified: Secondary | ICD-10-CM | POA: Diagnosis not present

## 2021-09-12 DIAGNOSIS — C7931 Secondary malignant neoplasm of brain: Secondary | ICD-10-CM

## 2021-09-12 DIAGNOSIS — Z95828 Presence of other vascular implants and grafts: Secondary | ICD-10-CM

## 2021-09-12 IMAGING — MR MR HEAD WO/W CM
13 series · 48 of 48 positions shown · IV contrast (multihance)
Comparison: [DATE]

CLINICAL DATA: Brain/CNS neoplasm, assess treatment response.
Metastatic lung cancer.

EXAM:
MRI HEAD WITHOUT AND WITH CONTRAST
TECHNIQUE: Multiplanar, multiecho pulse sequences of the brain and surrounding
structures were obtained without and with intravenous contrast.
CONTRAST:  20mL MULTIHANCE GADOBENATE DIMEGLUMINE 529 MG/ML IV SOLN
Contrast was administered via a port which was accessed by a
registered nurse.

[Series 2: FLAIR · sagittal · 3.0mm · 0.75mm/px · 1 of 39 slices shown (1 of 2)]
[im 1/39]
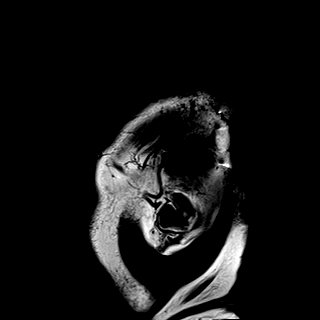

[Series 3: DWI · axial · 3.0mm · 1.50mm/px · z∈[-81,+77]mm · 3 of 84 slices shown (1 of 2)]
[im 1/84]
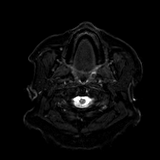
[im 42/84]
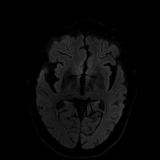
[im 84/84]
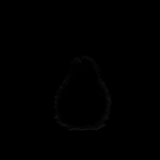

[Series 4: DWI · axial · 3.0mm · 1.50mm/px · z∈[-81,+77]mm · 2 of 41 slices shown (2 of 2)]
[im 1/41]
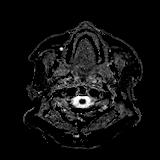
[im 41/41]
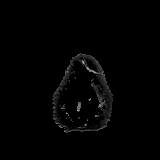

[Series 5: T2 · axial · 5.0mm · 0.60mm/px · 1 of 27 slices shown (1 of 2)]
[im 1/27]
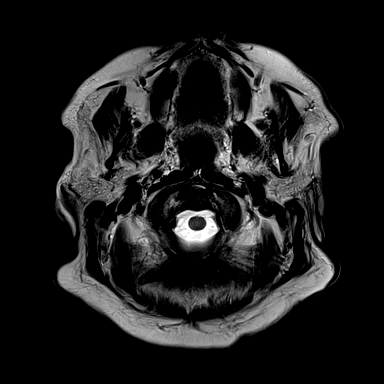

[Series 6: swi_images · axial · 1.5mm · 0.90mm/px · z∈[-78,+75]mm · 5 of 104 slices shown]
[im 1/104]
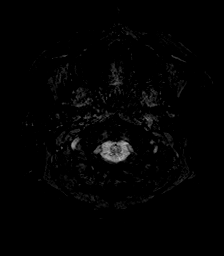
[im 26/104]
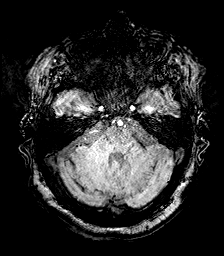
[im 52/104]
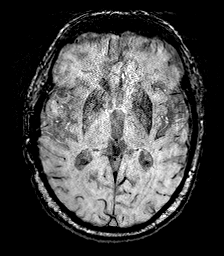
[im 78/104]
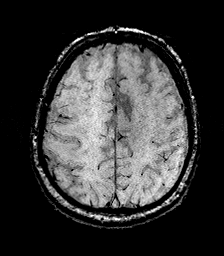
[im 104/104]
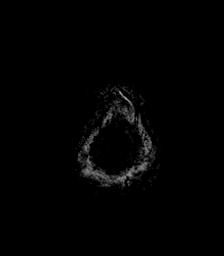

[Series 7: mip_images(sw) · axial · 12.0mm · 0.90mm/px · z∈[-73,+70]mm · 4 of 97 slices shown]
[im 1/97]
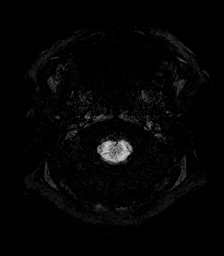
[im 33/97]
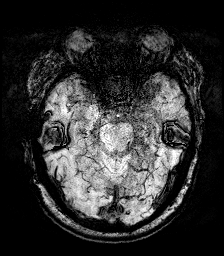
[im 65/97]
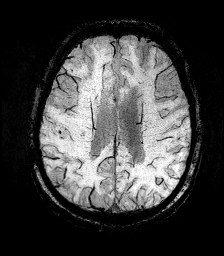
[im 97/97]
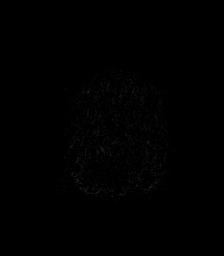

[Series 8: FLAIR · axial · 3.0mm · 0.90mm/px · z∈[-123,+69]mm · 3 of 65 slices shown (2 of 2)]
[im 1/65]
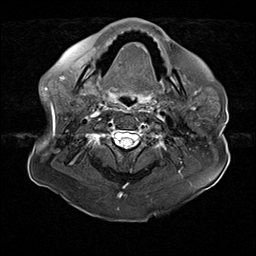
[im 33/65]
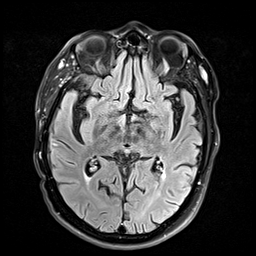
[im 65/65]
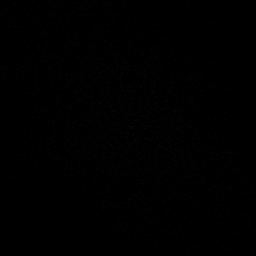

[Series 9: T2 · axial · non-contrast · 1.0mm · 0.90mm/px · z∈[-110,+62]mm · 8 of 176 slices shown (2 of 2)]
[im 1/176]
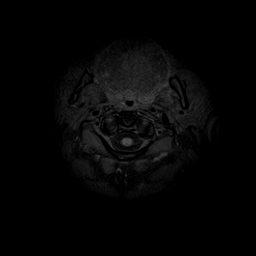
[im 26/176]
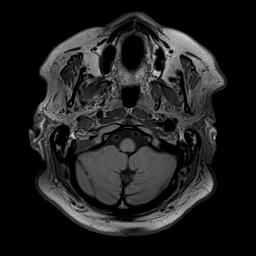
[im 51/176]
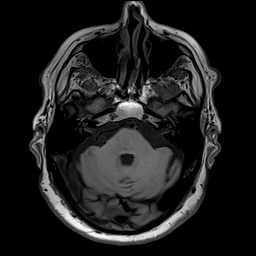
[im 76/176]
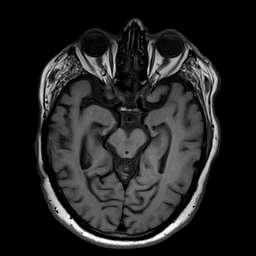
[im 101/176]
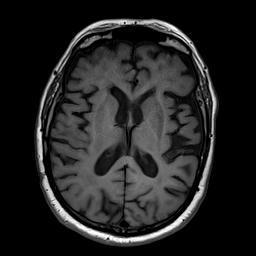
[im 126/176]
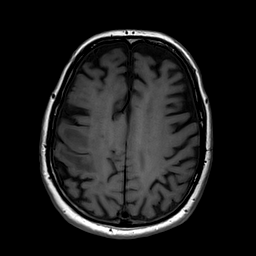
[im 151/176]
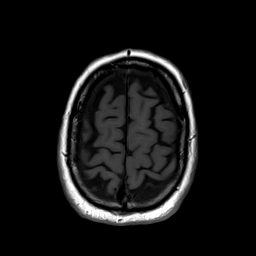
[im 176/176]
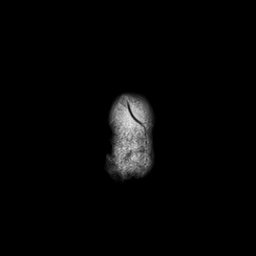

[Series 10: T2 post-contrast · coronal · 3.0mm · 0.57mm/px · 2 of 48 slices shown (1 of 2)]
[im 1/48]
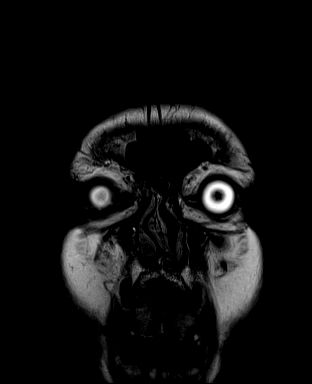
[im 48/48]
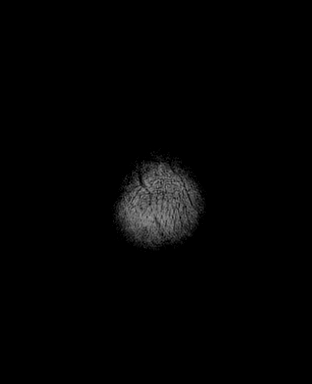

[Series 11: T2 post-contrast · axial · 1.0mm · 0.90mm/px · z∈[-110,+62]mm · 8 of 176 slices shown (2 of 2)]
[im 1/176]
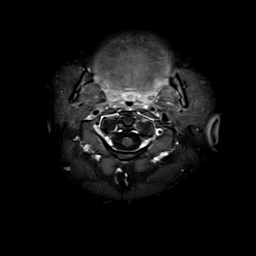
[im 26/176]
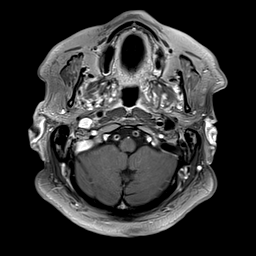
[im 51/176]
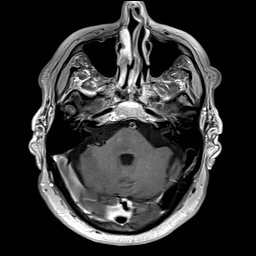
[im 76/176]
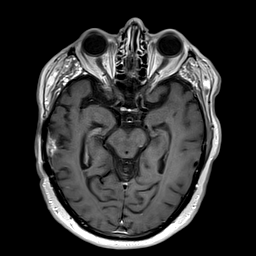
[im 101/176]
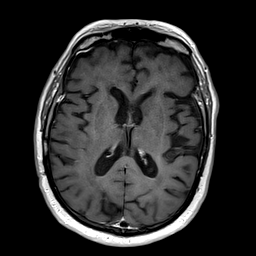
[im 126/176]
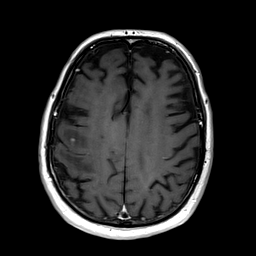
[im 151/176]
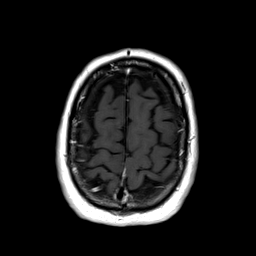
[im 176/176]
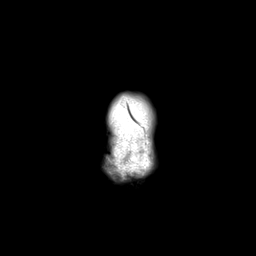

[Series 12: T1 post-contrast · axial · 1.0mm · 0.75mm/px · z∈[-104,+55]mm · 7 of 160 slices shown (1 of 2)]
[im 1/160]
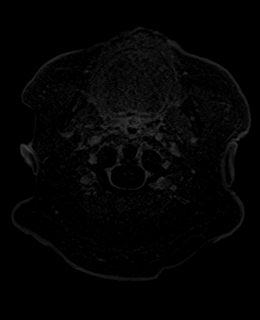
[im 27/160]
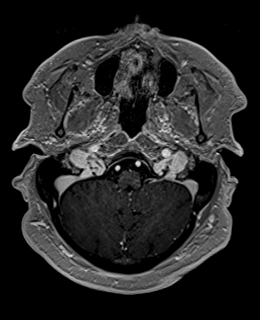
[im 54/160]
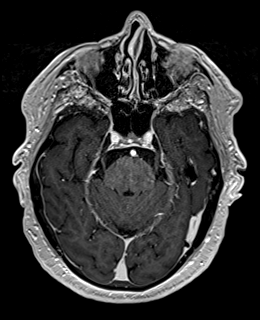
[im 80/160]
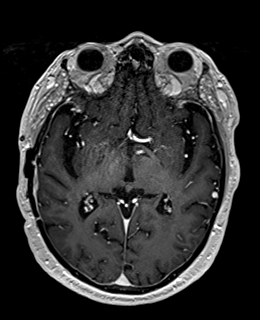
[im 107/160]
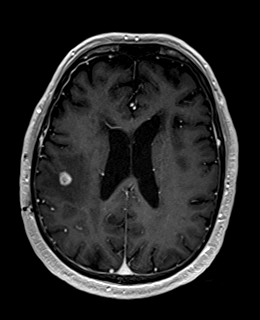
[im 133/160]
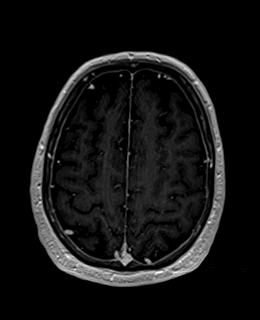
[im 160/160]
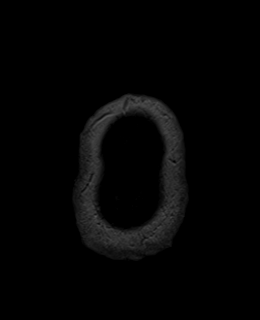

[Series 13: T1 post-contrast · coronal · 3.0mm · 0.57mm/px · 2 of 48 slices shown (2 of 2)]
[im 1/48]
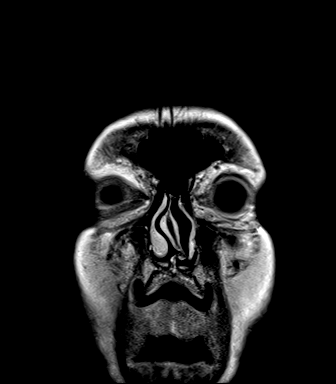
[im 48/48]
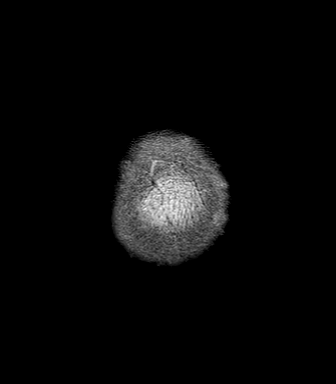

[Series 14: FLAIR post-contrast · sagittal · 3.0mm · 0.75mm/px · 2 of 39 slices shown]
[im 1/39]
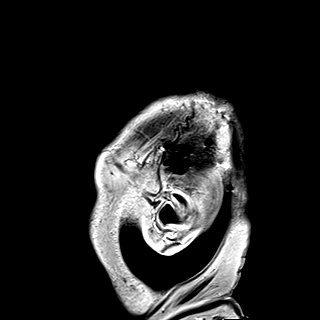
[im 39/39]
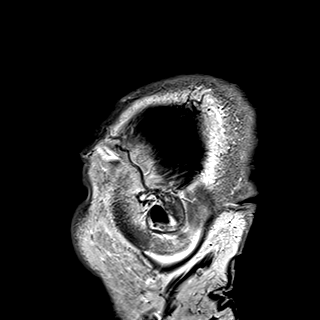

[48 of 48 positions shown; findings below may reference images not displayed]

FINDINGS: Brain:

New lesions:

1. 15 mm lesion with irregular peripheral enhancement in the
posterior right frontal lobe with mild to moderate edema (series 11,
image 117).

Larger lesions:

1. 4 mm enhancing subcortical lesion in the right parietal lobe
(series 11, image 121, previously 2 mm).

Stable or smaller lesions:

1. Nonprogressive enhancement at the right temporal resection site
with unchanged mild edema (series 11, image 73).

Other brain findings: No acute infarct, midline shift, or
extra-axial fluid collection is identified. A small amount of
chronic blood products are noted at the right temporal resection
site. Small T2 hyperintensities elsewhere in the cerebral white
matter bilaterally are unchanged and nonspecific but compatible with
mild-to-moderate chronic small vessel ischemic disease. There is
mild-to-moderate cerebral atrophy.

Vascular: Major intracranial vascular flow voids are preserved.

Skull and upper cervical spine: Right temporal craniotomy. No
suspicious marrow lesion.

Sinuses/Orbits: Unremarkable orbits. Paranasal sinuses and mastoid
air cells are clear.

Other: None.
IMPRESSION: 1. New 15 mm right frontal metastasis with mild to moderate edema.
2. Slightly increased size of 4 mm right parietal metastasis.
3. Nonprogressive enhancement at the right temporal resection site.

## 2021-09-12 MED ORDER — SODIUM CHLORIDE 0.9% FLUSH
10.0000 mL | INTRAVENOUS | Status: DC | PRN
  Administered 2021-09-12: 10 mL via INTRAVENOUS

## 2021-09-12 MED ORDER — GADOBENATE DIMEGLUMINE 529 MG/ML IV SOLN
20.0000 mL | Freq: Once | INTRAVENOUS | Status: AC | PRN
  Administered 2021-09-12: 20 mL via INTRAVENOUS

## 2021-09-12 MED ORDER — HEPARIN SOD (PORK) LOCK FLUSH 100 UNIT/ML IV SOLN
500.0000 [IU] | Freq: Once | INTRAVENOUS | Status: AC
  Administered 2021-09-12: 500 [IU] via INTRAVENOUS

## 2021-09-15 ENCOUNTER — Inpatient Hospital Stay: Payer: No Typology Code available for payment source

## 2021-09-16 ENCOUNTER — Telehealth: Payer: Self-pay

## 2021-09-16 ENCOUNTER — Inpatient Hospital Stay (HOSPITAL_BASED_OUTPATIENT_CLINIC_OR_DEPARTMENT_OTHER): Payer: No Typology Code available for payment source | Admitting: Internal Medicine

## 2021-09-16 ENCOUNTER — Other Ambulatory Visit: Payer: Self-pay

## 2021-09-16 VITALS — BP 148/98 | HR 89 | Temp 97.6°F | Resp 18 | Wt 239.9 lb

## 2021-09-16 DIAGNOSIS — C7931 Secondary malignant neoplasm of brain: Secondary | ICD-10-CM | POA: Diagnosis not present

## 2021-09-16 MED ORDER — LEVETIRACETAM 500 MG PO TABS: 500.0000 mg | ORAL_TABLET | Freq: Two times a day (BID) | ORAL | 3 refills | Status: DC

## 2021-09-16 MED ORDER — LORAZEPAM 0.5 MG PO TABS: ORAL_TABLET | ORAL | 0 refills | Status: DC

## 2021-09-16 NOTE — Telephone Encounter (Addendum)
09/17/21 - Dr Bobby Rumpf wants pt to continue taking the [place]. Pt's wife notified, & verbalized understanding.  09/06/10/22 - I notified Dr Bobby Rumpf of below @ 1540.    09/16/21 - @ 89 I spoke with pt's wife. She called to make Dr Bobby Rumpf aware that "Alex Cordova has a new lesion on the brain. This one is larger than the other one. The doctor put him on Keppra 500mg  po BID and he will get more radiation". Pt is still taking the Tabrecta.

## 2021-09-16 NOTE — Progress Notes (Signed)
[facility] at [place], [place] [address] [phone]   Interval Evaluation  Date of Service: 09/16/21 Patient Name: Alex Cordova Patient MRN: [id number] Patient DOB: Feb 27, 1946 Provider: Ventura Sellers, MD  Identifying Statement:  Alex Cordova is a 75 y.o. male with Brain metastasis [facility]) [C79.31]   Primary Cancer: [place] Lung Stage IV  Oncologic History: 04/18/20: Pre-op SRS R temporal 14 Gy 04/19/20: Craniotomy, resection by Dr. Saintclair Halsted 07/30/20: Salvage SRS R subcortex 09/16/21: New 1.5cm R frontal met, rec SRS  Interval History:  Alex Cordova presents today for follow up after recent MRI brain.  He and his wife describe five episodes of "left face twitching, tongue protruding to the left, and left facial numbness" over the past days/weeks.  Each episode lasts several minutes and resolves on its own.  He also describes occasionally "smelling smoke when it's clearly not there" over the past 6-8 weeks.  No further issues with his left side. Continues on Capmatinib as prior with Dr. Bobby Rumpf in [place].  H+P (07/23/20) Patient presents to clinic today for follow up after 3 months post-SRS/craniotomy MRI scan.  He describes no new or progressive neurologic deficits.  No headaches or seizures.  He no longer takes Keppra and he has recently completed tapering off all corticosteroids.  He remains relatively active around the home, independent with gait and activities of daily living.  Currently on observation and serial imaging monitoring with Dr. Bobby Rumpf for lung cancer.   Medications: Current Outpatient Medications on File Prior to Visit  Medication Sig Dispense Refill   acetaminophen (TYLENOL) 325 MG tablet Take 650 mg by mouth every 6 (six) hours as needed.     Alogliptin Benzoate 25 MG TABS Take 0.5 tablets by mouth in the morning and at bedtime. 60 tablet 0   calcium-vitamin D (OSCAL WITH D) 500-200 MG-UNIT  tablet Take 1 tablet by mouth 2 (two) times daily.     capmatinib (TABRECTA) 200 MG tablet Take 2 tablets (400 mg total) by mouth 2 (two) times daily. 120 tablet 3   esomeprazole (NEXIUM) 40 MG capsule Take 40 mg by mouth daily at 12 noon.     furosemide (LASIX) 20 MG tablet Take 1 tablet (20 mg total) by mouth daily as needed for edema. 30 tablet 5   ibuprofen (ADVIL) 200 MG tablet Take 200 mg by mouth every 6 (six) hours as needed.     LORazepam (ATIVAN) 0.5 MG tablet 1 tab po 30 minutes prior to radiation or MRI 10 tablet 0   potassium chloride (KLOR-CON) 20 MEQ packet Take 20 mEq by mouth daily.     Psyllium (METAMUCIL PO) Take 1 Scoop by mouth daily.     Current Facility-Administered Medications on File Prior to Visit  Medication Dose Route Frequency Provider Last Rate Last Admin   sodium chloride flush (NS) 0.9 % injection 10 mL  10 mL Intracatheter PRN Mosher, Kelli A, PA-C   10 mL at 09/08/21 0900    Allergies:  Allergies  Allergen Reactions   Metformin Diarrhea   Sulfamethoxazole Nausea Only    Childhood allergy   Past Medical History:  Past Medical History:  Diagnosis Date   Arthritis    Diabetes mellitus type 2 in obese (HCC)    Full dentures    GERD (gastroesophageal reflux disease)    Hyperlipidemia    Lung cancer ([facility])    Metastatic cancer to brain ([place])    Pneumonia  Wears glasses    Past Surgical History:  Past Surgical History:  Procedure Laterality Date   APPLICATION OF CRANIAL NAVIGATION N/A 04/19/2020   Procedure: APPLICATION OF CRANIAL NAVIGATION;  Surgeon: Kary Kos, MD;  Location: [facility];  Service: Neurosurgery;  Laterality: N/A;  APPLICATION OF CRANIAL NAVIGATION   COLONOSCOPY W/ BIOPSIES AND POLYPECTOMY     CRANIOTOMY Right 04/19/2020   Procedure: Right Temporal stereotactic craniotomy;  Surgeon: Kary Kos, MD;  Location: [facility];  Service: Neurosurgery;  Laterality: Right;   FRACTURE SURGERY     right shoulder   MULTIPLE TOOTH EXTRACTIONS      Social History:  Social History   Socioeconomic History   Marital status: Married    Spouse name: Not on file   Number of children: Not on file   Years of education: Not on file   Highest education level: Not on file  Occupational History   Not on file  Tobacco Use   Smoking status: Former    Types: Cigarettes   Smokeless tobacco: Never   Tobacco comments:    quit smoking in 1972  Vaping Use   Vaping Use: Never used  Substance and Sexual Activity   Alcohol use: Not Currently    Comment: not since 1972   Drug use: Never   Sexual activity: Not on file  Other Topics Concern   Not on file  Social History Narrative   Not on file   Social Determinants of Health   Financial Resource Strain: Not on file  Food Insecurity: Not on file  Transportation Needs: Not on file  Physical Activity: Not on file  Stress: Not on file  Social Connections: Not on file  Intimate Partner Violence: Not on file   Family History: No family history on file.  Review of Systems: Constitutional: Doesn't report fevers, chills or abnormal weight loss Eyes: Doesn't report blurriness of vision Ears, nose, mouth, throat, and face: Doesn't report sore throat Respiratory: Doesn't report cough, dyspnea or wheezes Cardiovascular: Doesn't report palpitation, chest discomfort  Gastrointestinal:  Doesn't report nausea, constipation, diarrhea GU: Doesn't report incontinence Skin: Doesn't report skin rashes Neurological: Per HPI Musculoskeletal: Doesn't report joint pain Behavioral/Psych: Doesn't report anxiety  Physical Exam: Vitals:   09/16/21 1122  BP: (!) 148/98  Pulse: 89  Resp: 18  Temp: 97.6 F (36.4 C)  SpO2: 97%   KPS: 90. General: Alert, cooperative, pleasant, in no acute distress Head: Normal EENT: No conjunctival injection or scleral icterus.  Lungs: Resp effort normal Cardiac: Regular rate Abdomen: Non-distended abdomen Skin: No rashes cyanosis or petechiae. Extremities: No  clubbing or edema  Neurologic Exam: Mental Status: Awake, alert, attentive to examiner. Oriented to self and environment. Language is fluent with intact comprehension.  Cranial Nerves: Visual acuity is grossly normal. Visual fields are full. Extra-ocular movements intact. No ptosis. Face is symmetric Motor: Tone and bulk are normal. Power is full in both arms and legs. Reflexes are symmetric, no pathologic reflexes present.  Sensory: Intact to light touch Gait: Normal.   Labs: I have reviewed the data as listed    Component Value Date/Time   NA 136 (A) 09/08/2021 0000   K 3.7 09/08/2021 0000   CL 102 09/08/2021 0000   CO2 29 (A) 09/08/2021 0000   GLUCOSE 133 (H) 10/08/2020 0919   BUN 18 09/08/2021 0000   CREATININE 1.3 09/08/2021 0000   CREATININE 1.38 (H) 10/08/2020 0919   CALCIUM 8.9 09/08/2021 0000   PROT 5.7 (A) 09/08/2021 0000  ALBUMIN 3.2 (A) 09/08/2021 0000   AST 24 09/08/2021 0000   AST 23 10/08/2020 0919   ALT 19 09/08/2021 0000   ALT 26 10/08/2020 0919   ALKPHOS 53 09/08/2021 0000   BILITOT 0.7 10/08/2020 0919   GFRNONAA 53 (L) 10/08/2020 0919   GFRAA >60 07/23/2020 1253   Lab Results  Component Value Date   WBC 7.6 09/08/2021   NEUTROABS 4.71 09/08/2021   HGB 15.4 09/08/2021   HCT 47 09/08/2021   MCV 90 09/08/2021   PLT 158 09/08/2021    Imaging:  MR BRAIN W WO CONTRAST  Result Date: 09/13/2021 CLINICAL DATA:  Brain/CNS neoplasm, assess treatment response. Metastatic lung cancer. EXAM: MRI HEAD WITHOUT AND WITH CONTRAST TECHNIQUE: Multiplanar, multiecho pulse sequences of the brain and surrounding structures were obtained without and with intravenous contrast. CONTRAST:  52m MULTIHANCE GADOBENATE DIMEGLUMINE 529 MG/ML IV SOLN Contrast was administered via a port which was accessed by a rEquities trader COMPARISON:  04/25/2021 FINDINGS: Brain: New lesions: 1. 15 mm lesion with irregular peripheral enhancement in the posterior right frontal lobe with mild  to moderate edema (series 11, image 117). Larger lesions: 1. 4 mm enhancing subcortical lesion in the right parietal lobe (series 11, image 121, previously 2 mm). Stable or smaller lesions: 1. Nonprogressive enhancement at the right temporal resection site with unchanged mild edema (series 11, image 73). Other brain findings: No acute infarct, midline shift, or extra-axial fluid collection is identified. A small amount of chronic blood products are noted at the right temporal resection site. Small T2 hyperintensities elsewhere in the cerebral white matter bilaterally are unchanged and nonspecific but compatible with mild-to-moderate chronic small vessel ischemic disease. There is mild-to-moderate cerebral atrophy. Vascular: Major intracranial vascular flow voids are preserved. Skull and upper cervical spine: Right temporal craniotomy. No suspicious marrow lesion. Sinuses/Orbits: Unremarkable orbits. Paranasal sinuses and mastoid air cells are clear. Other: None. IMPRESSION: 1. New 15 mm right frontal metastasis with mild to moderate edema. 2. Slightly increased size of 4 mm right parietal metastasis. 3. Nonprogressive enhancement at the right temporal resection site. Electronically Signed   By: ALogan BoresM.D.   On: 09/13/2021 11:55     CTurnervilleClinician Interpretation: I have personally reviewed the radiological images as listed.  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  Etiology is new R frontal metastasis from primary lung cancer.  MRI also demonstrates mild progression of previously treated R corona radiata metastasis, within expected margin for radiation treatment effect.  We discussed his case in brain/spine tumor board this week, recommended salvage radiosurgery.   He is agreeable with this plan.  For seizures, he will initiate therapy with Keppra 5050mBID.  This had been well tolerated previously in the post-operative period.  He should remain off decadron in the interim, unless focal symptoms develop or seizures are refractory to AED.  We appreciate the opportunity to participate in the care of KeNafis Farnan He has [facility]  We ask that KeLaakea Pereiraeturn to clinic in 3 months following post-SRS brain MRI, or sooner as needed.  He will also revisit utility of Capmatinib therapy with Dr. LeBobby Rumpfiven this CNS progression.  All questions were answered. The patient knows to call the clinic  with any problems, questions or concerns. No barriers to learning were detected.  I have spent a total of 40 minutes of face-to-face and non-face-to-face time, excluding clinical staff time, preparing to see patient, ordering tests and/or medications, counseling the patient, and independently interpreting results and communicating results to the patient/family/caregiver    Ventura Sellers, MD Medical Director of Neuro-Oncology [facility] at [place] 09/16/21 11:23 AM

## 2021-09-17 ENCOUNTER — Telehealth: Payer: Self-pay | Admitting: Internal Medicine

## 2021-09-17 ENCOUNTER — Other Ambulatory Visit: Payer: Self-pay | Admitting: Radiation Therapy

## 2021-09-17 ENCOUNTER — Encounter: Payer: Self-pay | Admitting: Hematology and Oncology

## 2021-09-17 NOTE — Telephone Encounter (Signed)
Scheduled appt per 10/11 los. Called pt, no answer. Left msg with appt date and time.

## 2021-09-18 ENCOUNTER — Ambulatory Visit
Admission: RE | Admit: 2021-09-18 | Discharge: 2021-09-18 | Disposition: A | Discharge status: Home / Self Care | Payer: No Typology Code available for payment source | Source: Ambulatory Visit | Attending: Radiation Oncology | Admitting: Radiation Oncology

## 2021-09-18 ENCOUNTER — Encounter: Payer: Self-pay | Admitting: Radiation Oncology

## 2021-09-18 ENCOUNTER — Ambulatory Visit
Admission: RE | Admit: 2021-09-18 | Discharge: 2021-09-18 | Disposition: A | Discharge status: Home / Self Care | Payer: No Typology Code available for payment source | Source: Ambulatory Visit | Attending: Internal Medicine | Admitting: Internal Medicine

## 2021-09-18 ENCOUNTER — Other Ambulatory Visit: Payer: Self-pay

## 2021-09-18 VITALS — BP 149/93 | HR 74 | Temp 97.5°F | Resp 18 | Ht 70.0 in | Wt 236.4 lb

## 2021-09-18 DIAGNOSIS — K219 Gastro-esophageal reflux disease without esophagitis: Secondary | ICD-10-CM | POA: Insufficient documentation

## 2021-09-18 DIAGNOSIS — E119 Type 2 diabetes mellitus without complications: Secondary | ICD-10-CM | POA: Insufficient documentation

## 2021-09-18 DIAGNOSIS — M129 Arthropathy, unspecified: Secondary | ICD-10-CM | POA: Diagnosis not present

## 2021-09-18 DIAGNOSIS — R609 Edema, unspecified: Secondary | ICD-10-CM | POA: Diagnosis not present

## 2021-09-18 DIAGNOSIS — C3412 Malignant neoplasm of upper lobe, left bronchus or lung: Secondary | ICD-10-CM

## 2021-09-18 DIAGNOSIS — Z8701 Personal history of pneumonia (recurrent): Secondary | ICD-10-CM | POA: Insufficient documentation

## 2021-09-18 DIAGNOSIS — C7931 Secondary malignant neoplasm of brain: Secondary | ICD-10-CM

## 2021-09-18 DIAGNOSIS — D496 Neoplasm of unspecified behavior of brain: Secondary | ICD-10-CM

## 2021-09-18 DIAGNOSIS — C3432 Malignant neoplasm of lower lobe, left bronchus or lung: Secondary | ICD-10-CM | POA: Diagnosis not present

## 2021-09-18 DIAGNOSIS — Z87891 Personal history of nicotine dependence: Secondary | ICD-10-CM | POA: Insufficient documentation

## 2021-09-18 DIAGNOSIS — Z7952 Long term (current) use of systemic steroids: Secondary | ICD-10-CM | POA: Insufficient documentation

## 2021-09-18 DIAGNOSIS — Z79899 Other long term (current) drug therapy: Secondary | ICD-10-CM | POA: Insufficient documentation

## 2021-09-18 DIAGNOSIS — E785 Hyperlipidemia, unspecified: Secondary | ICD-10-CM | POA: Diagnosis not present

## 2021-09-18 MED ORDER — SODIUM CHLORIDE 0.9% FLUSH
10.0000 mL | Freq: Once | INTRAVENOUS | Status: AC
  Administered 2021-09-18: 10 mL via INTRAVENOUS

## 2021-09-18 MED ORDER — HEPARIN SOD (PORK) LOCK FLUSH 100 UNIT/ML IV SOLN
500.0000 [IU] | Freq: Once | INTRAVENOUS | Status: AC
  Administered 2021-09-18: 500 [IU] via INTRAVENOUS

## 2021-09-18 NOTE — Progress Notes (Signed)
Has armband been applied?  Yes.    Does patient have an allergy to IV contrast dye?: No.   Has patient ever received premedication for IV contrast dye?: No.   Does patient take metformin?: No.  If patient does take metformin when was the last dose: n/a  Date of lab work: 09/08/2021 BUN: 18 CR: 1.3 eGfr:    IV site: Right Chest Port  Has IV site been added to flowsheet?  Yes  Anniebelle Devore M. Quincy Simmonds [facility]

## 2021-09-18 NOTE — Progress Notes (Signed)
Patient states doing well. No symptoms reported at this time.  Meaningful use complete.  BP (!) 149/93 (BP Location: Left Arm, Patient Position: Sitting, Cuff Size: Normal)   Pulse 74   Temp (!) 97.5 F (36.4 C) (Oral)   Resp 18   Ht 5\' 10"  (1.778 m)   Wt 236 lb 6.4 oz (107.2 kg)   SpO2 98%   BMI 33.92 kg/m

## 2021-09-23 DIAGNOSIS — C7931 Secondary malignant neoplasm of brain: Secondary | ICD-10-CM | POA: Diagnosis not present

## 2021-09-25 ENCOUNTER — Ambulatory Visit: Payer: No Typology Code available for payment source | Admitting: Radiation Oncology

## 2021-09-26 ENCOUNTER — Encounter: Payer: Self-pay | Admitting: Radiation Oncology

## 2021-09-26 ENCOUNTER — Ambulatory Visit
Admission: RE | Admit: 2021-09-26 | Discharge: 2021-09-26 | Disposition: A | Discharge status: Home / Self Care | Payer: No Typology Code available for payment source | Source: Ambulatory Visit | Attending: Radiation Oncology | Admitting: Radiation Oncology

## 2021-09-26 ENCOUNTER — Other Ambulatory Visit: Payer: Self-pay

## 2021-09-26 VITALS — BP 156/93 | HR 68 | Temp 97.3°F | Resp 18

## 2021-09-26 DIAGNOSIS — C7931 Secondary malignant neoplasm of brain: Secondary | ICD-10-CM

## 2021-09-26 NOTE — Progress Notes (Signed)
  Radiation Oncology         [phone] ________________________________  Stereotactic Treatment Procedure Note  Name: Alex Cordova MRN: [id number]  Date: 09/26/2021  DOB: 1946/09/30  SPECIAL TREATMENT PROCEDURE    ICD-10-CM   1. Brain metastasis ([place])  C79.31       3D TREATMENT PLANNING AND DOSIMETRY:  The patient's radiation plan was reviewed and approved by neurosurgery and radiation oncology prior to treatment.  It showed 3-dimensional radiation distributions overlaid onto the planning CT/MRI image set.  The [facility] for the target structures as well as the organs at risk were reviewed. The documentation of the 3D plan and dosimetry are filed in the radiation oncology EMR.  NARRATIVE:  Alex Cordova was brought to the TrueBeam stereotactic radiation treatment machine and placed supine on the CT couch. The head frame was applied, and the patient was set up for stereotactic radiosurgery.  Neurosurgery was present for the set-up and delivery  SIMULATION VERIFICATION:  In the couch zero-angle position, the patient underwent Exactrac imaging using the Brainlab system with orthogonal KV images.  These were carefully aligned and repeated to confirm treatment position for each of the isocenters.  The Exactrac snap film verification was repeated at each couch angle.  PROCEDURE: Alex Cordova received stereotactic radiosurgery to the following targets: Right Frontal 15 mm target was treated using 4 Rapid Arc VMAT Beams to a prescription dose of 20 Gy.  ExacTrac registration was performed for each couch angle.  The 100% isodose line was prescribed.  6 MV X-rays were delivered in the flattening filter free beam mode.  STEREOTACTIC TREATMENT MANAGEMENT:  Following delivery, the patient was transported to nursing in stable condition and monitored for possible acute effects.  Vital signs were recorded. The patient tolerated treatment without significant acute effects, and was  discharged to home in stable condition.    PLAN: Follow-up in one month.  ________________________________  Sheral Apley. Tammi Klippel, M.D.

## 2021-09-26 NOTE — Progress Notes (Addendum)
Alex Cordova rested with [place] for 30 minutes following his SRS treatment.  Patient denies headache, dizziness, nausea, diplopia or ringing in the ears. Denies fatigue. Patient without complaints. Understands to avoid strenuous activity for the next 24 hours and call [phone] with needs.  Patient ambulated out of the clinic without difficulty with his wife.  BP (!) 156/93   Pulse 68   Temp (!) 97.3 F (36.3 C)   Resp 18   SpO2 99%    Alex Cordova, BSN

## 2021-09-29 ENCOUNTER — Telehealth: Payer: Self-pay

## 2021-09-29 NOTE — Telephone Encounter (Addendum)
Ameryles, pt's wife, called to make sure she was doing everything she should be doing to take care of Alex Cordova. She states, "He just acts like he is getting real weal. His demeanor has changed. He is still eating and drinking ok. His speech is slurred at times, and other times it's fine. His face is still droopy on one side, which they said it was from one of those seizures he had". His last radiation treatment was last Friday. Dr Mickeal Skinner increased his Keppra up to 4 tablets per day on Friday. I assured Ameryles that she was doing a good job taking care of him and to continue monitoring him for any changes.    I sent the above message to [facility] P,NP,&  Dr Bobby Rumpf.

## 2021-09-30 ENCOUNTER — Encounter: Payer: Self-pay | Admitting: Hematology and Oncology

## 2021-09-30 ENCOUNTER — Telehealth: Payer: Self-pay | Admitting: Radiation Therapy

## 2021-09-30 NOTE — Telephone Encounter (Signed)
I called to check in on Alex Cordova today, he was not available, but I spoke with his wife. Alex Cordova said that he is very fatigued, just has no endurance at all. She believes this has worsened since the increase of Keppra started on Friday 10/21. She also mentioned that the left side of his face has "been droopy", but said that has been going on for a week now and no worse. He is bothered when his drink dribbles from his mouth while trying to swallow.    I have forwarded this message to Dr. Mickeal Skinner and his nurse Rhae Hammock RN.  Mont Dutton  R.T.(R)(T) Radiation Special Procedures Navigator

## 2021-10-01 ENCOUNTER — Other Ambulatory Visit: Payer: Self-pay | Admitting: Oncology

## 2021-10-02 ENCOUNTER — Inpatient Hospital Stay (HOSPITAL_BASED_OUTPATIENT_CLINIC_OR_DEPARTMENT_OTHER): Payer: No Typology Code available for payment source | Admitting: Internal Medicine

## 2021-10-02 ENCOUNTER — Other Ambulatory Visit: Payer: Self-pay

## 2021-10-02 VITALS — BP 131/95 | HR 72 | Temp 98.1°F | Resp 18 | Wt 230.3 lb

## 2021-10-02 DIAGNOSIS — C7931 Secondary malignant neoplasm of brain: Secondary | ICD-10-CM | POA: Diagnosis not present

## 2021-10-02 MED ORDER — DEXAMETHASONE 4 MG PO TABS: 4.0000 mg | ORAL_TABLET | Freq: Two times a day (BID) | ORAL | 1 refills | Status: DC

## 2021-10-02 NOTE — Progress Notes (Signed)
[facility] [place], [place] [address] [phone]   Interval Evaluation  Date of Service: 10/02/21 Patient Name: Alex Cordova Patient MRN: [id number] Patient DOB: 21-Nov-1946 Provider: Ventura Sellers, MD  Identifying Statement:  [name] is a 75 y.o. male with Brain metastasis [facility]) [C79.31]   Primary Cancer: [place] Lung Stage IV  Oncologic History: 04/18/20: Pre-op SRS R temporal 14 Gy 04/19/20: Craniotomy, resection by Dr. Saintclair Halsted 07/30/20: Salvage SRS R subcortex 09/26/21: Salvage SRS 1.5cm R frontal met  Interval History: Alex Cordova presents today for follow up after recent clinical changes following radiosurgery.  He and his wife describe decline in overall function because of increased fatigue, lethargy, "weakness all over".  Also describes some difficulty finding words, and drooping of the left side of his face.  He had breakthrough seizure last week, Keppra was increased to 1068m twice per day, no further events since that time. No further issues with his left arm or leg. Continues on Capmatinib as prior with Dr. LBobby Rumpfin [place]  H+P (07/23/20) Patient presents to clinic today for follow up after 3 months post-SRS/craniotomy MRI scan.  He describes no new or progressive neurologic deficits.  No headaches or seizures.  He no longer takes Keppra and he has recently completed tapering off all corticosteroids.  He remains relatively active around the home, independent with gait and activities of daily living.  Currently on observation and serial imaging monitoring with Dr. LBobby Rumpffor lung cancer.   Medications: Current Outpatient Medications on File Prior to Visit  Medication Sig Dispense Refill   acetaminophen (TYLENOL) 325 MG tablet Take 650 mg by mouth every 6 (six) hours as needed.     Alogliptin Benzoate 25 MG TABS Take 0.5 tablets by mouth in the morning and at bedtime. 60 tablet 0    calcium-vitamin D (OSCAL WITH D) 500-200 MG-UNIT tablet Take 1 tablet by mouth 2 (two) times daily.     capmatinib (TABRECTA) 200 MG tablet Take 2 tablets (400 mg total) by mouth 2 (two) times daily. 120 tablet 3   esomeprazole (NEXIUM) 40 MG capsule Take 40 mg by mouth daily at 12 noon.     furosemide (LASIX) 20 MG tablet Take 1 tablet (20 mg total) by mouth daily as needed for edema. 30 tablet 5   ibuprofen (ADVIL) 200 MG tablet Take 200 mg by mouth every 6 (six) hours as needed.     levETIRAcetam (KEPPRA) 500 MG tablet Take 500 mg by mouth as directed. Pt to take 1 tab in morning, 1 tab mid day and 2 tabs at bedtime.     LORazepam (ATIVAN) 0.5 MG tablet 1 tab po 30 minutes prior to radiation or MRI 10 tablet 0   potassium chloride (KLOR-CON) 20 MEQ packet Take 20 mEq by mouth daily.     Psyllium (METAMUCIL PO) Take 1 Scoop by mouth daily.     Current Facility-Administered Medications on File Prior to Visit  Medication Dose Route Frequency Provider Last Rate Last Admin   sodium chloride flush (NS) 0.9 % injection 10 mL  10 mL Intracatheter PRN Mosher, Kelli A, PA-C   10 mL at 09/08/21 0900    Allergies:  Allergies  Allergen Reactions   Metformin Diarrhea   Other Swelling    Tongue swelling   Rosuvastatin     Other reaction(s): Myositis   Sulfamethoxazole Nausea Only    Childhood allergy   Past Medical History:  Past Medical History:  Diagnosis Date   Arthritis    Diabetes mellitus type 2 in obese ([place])    Full dentures    GERD (gastroesophageal reflux disease)    Hyperlipidemia    Lung cancer ([place])    Metastatic cancer to brain ([place])    Pneumonia    Wears glasses    Past Surgical History:  Past Surgical History:  Procedure Laterality Date   APPLICATION OF CRANIAL NAVIGATION N/A 04/19/2020   Procedure: APPLICATION OF CRANIAL NAVIGATION;  Surgeon: Kary Kos, MD;  Location: [place];  Service: Neurosurgery;  Laterality: N/A;  APPLICATION OF CRANIAL NAVIGATION   COLONOSCOPY W/  BIOPSIES AND POLYPECTOMY     CRANIOTOMY Right 04/19/2020   Procedure: Right Temporal stereotactic craniotomy;  Surgeon: Kary Kos, MD;  Location: [place];  Service: Neurosurgery;  Laterality: Right;   FRACTURE SURGERY     right shoulder   MULTIPLE TOOTH EXTRACTIONS     Social History:  Social History   Socioeconomic History   Marital status: Married    Spouse name: Not on file   Number of children: Not on file   Years of education: Not on file   Highest education level: Not on file  Occupational History   Not on file  Tobacco Use   Smoking status: Former    Types: Cigarettes   Smokeless tobacco: Never   Tobacco comments:    quit smoking in 1972  Vaping Use   Vaping Use: Never used  Substance and Sexual Activity   Alcohol use: Not Currently    Comment: not since 1972   Drug use: Never   Sexual activity: Not on file  Other Topics Concern   Not on file  Social History Narrative   Not on file   Social Determinants of Health   Financial Resource Strain: Not on file  Food Insecurity: Not on file  Transportation Needs: Not on file  Physical Activity: Not on file  Stress: Not on file  Social Connections: Not on file  Intimate Partner Violence: Not on file   Family History: No family history on file.  Review of Systems: Constitutional: Doesn't report fevers, chills or abnormal weight loss Eyes: Doesn't report blurriness of vision Ears, nose, mouth, throat, and face: Doesn't report sore throat Respiratory: Doesn't report cough, dyspnea or wheezes Cardiovascular: Doesn't report palpitation, chest discomfort  Gastrointestinal:  Doesn't report nausea, constipation, diarrhea GU: Doesn't report incontinence Skin: Doesn't report skin rashes Neurological: Per HPI Musculoskeletal: Doesn't report joint pain Behavioral/Psych: Doesn't report anxiety  Physical Exam: Vitals:   10/02/21 0853  BP: (!) 131/95  Pulse: 72  Resp: 18  Temp: 98.1 F (36.7 C)  SpO2: 98%   KPS:  90. General: Alert, cooperative, pleasant, in no acute distress Head: Normal EENT: No conjunctival injection or scleral icterus.  Lungs: Resp effort normal Cardiac: Regular rate Abdomen: Non-distended abdomen Skin: No rashes cyanosis or petechiae. Extremities: No clubbing or edema  Neurologic Exam: Mental Status: Awake, alert, attentive to examiner. Oriented to self and environment. Language is fluent with intact comprehension, but speech cadence is altered.  Cranial Nerves: Visual acuity is grossly normal. Visual fields are full. Extra-ocular movements intact. No ptosis. L UMN facial paresis Motor: Tone and bulk are normal. Power is full in both arms and legs. Reflexes are symmetric, no pathologic reflexes present.  Sensory: Intact to light touch Gait: Normal.   Labs: I have reviewed the data as listed    Component Value Date/Time   NA 136 (  A) 09/08/2021 0000   K 3.7 09/08/2021 0000   CL 102 09/08/2021 0000   CO2 29 (A) 09/08/2021 0000   GLUCOSE 133 (H) 10/08/2020 0919   BUN 18 09/08/2021 0000   CREATININE 1.3 09/08/2021 0000   CREATININE 1.38 (H) 10/08/2020 0919   CALCIUM 8.9 09/08/2021 0000   PROT 5.7 (A) 09/08/2021 0000   ALBUMIN 3.2 (A) 09/08/2021 0000   AST 24 09/08/2021 0000   AST 23 10/08/2020 0919   ALT 19 09/08/2021 0000   ALT 26 10/08/2020 0919   ALKPHOS 53 09/08/2021 0000   BILITOT 0.7 10/08/2020 0919   GFRNONAA 53 (L) 10/08/2020 0919   GFRAA >60 07/23/2020 1253   Lab Results  Component Value Date   WBC 7.6 09/08/2021   NEUTROABS 4.71 09/08/2021   HGB 15.4 09/08/2021   HCT 47 09/08/2021   MCV 90 09/08/2021   PLT 158 09/08/2021    Assessment/Plan Brain metastasis ([place]) [C79.31]  Yolanda Dockendorf is clinically progressive today after SRS one week prior.  Etiology is most likely acute post-radiosurgery inflammation, which has led to general, focal and epileptic syndrome.  We recommended initiating corticosteroids; will start with 51m BID x4 days,  then decrease to 477mdaily thereafter.  Because of fatigue associated temporally with Keppra, will decrease dose to 75052mID from 1000m52mD while decadron on board.  We appreciate the opportunity to participate in the care of KennJabez Molnere will follow up with him via phone in ~1 week to assess response to this intervention.  All questions were answered. The patient knows to call the clinic with any problems, questions or concerns. No barriers to learning were detected.  I have spent a total of 30 minutes of face-to-face and non-face-to-face time, excluding clinical staff time, preparing to see patient, ordering tests and/or medications, counseling the patient, and independently interpreting results and communicating results to the patient/family/caregiver    ZachVentura Sellers Medical Director of Neuro-Oncology [facility]27/22 9:05 AM

## 2021-10-04 NOTE — Progress Notes (Signed)
                                                                                                                                                             Patient Name: ADRIAN DINOVO MRN: [id number] DOB: 1946-07-10 Referring Physician: Alcus Dad (Profile Not Attached) Date of Service: 09/26/2021 [facility] [place]                                                        End Of Treatment Note  Diagnoses: C34.12-Malignant neoplasm of upper lobe, left bronchus or lung C79.31-Secondary malignant neoplasm of brain  Cancer Staging:  Stage IV, NSCLC, SCC of the LLL with metastatic disease to the acromion and metastatic adenocarcinoma felt to be lung primary to the brain  Intent: Palliative  Radiation Treatment Dates: 09/26/2021 SRS Treatment Site Technique Total Dose (Gy) Dose per Fx (Gy) Completed Fx Beam Energies  Brain:  [id number] IMRT 20/20 20 1/1 6XFFF   Narrative: The patient tolerated radiation therapy relatively well.   Plan: The patient will receive a call in about one month from the radiation oncology department. He will continue follow up with Dr. Mickeal Skinner and Dr. Bobby Rumpf as well.   ________________________________________________    Carola Rhine, [facility]

## 2021-10-05 ENCOUNTER — Encounter: Payer: Self-pay | Admitting: Hematology and Oncology

## 2021-10-05 NOTE — Progress Notes (Signed)
Radiation Oncology         [phone]) [phone] ________________________________  Name: Alex Cordova        MRN: [id number]  Date of Service: 09/18/2021 DOB: February 20, 1946  HD:[id number], Harrell Gave, MD  Caralyn Guile, DO     REFERRING PHYSICIAN: Caralyn Guile, DO   DIAGNOSIS: The primary encounter diagnosis was Metastasis to brain [facility]). A diagnosis of Malignant neoplasm of upper lobe of left lung (HCC) was also pertinent to this visit.   HISTORY OF PRESENT ILLNESS: Alex Cordova is a 75 y.o. male with a history of stage IV non-small cell adenocarcinoma of the left lung.   He was originally diagnosed in the spring 2020 with pain in the acromion which ultimately led to the work-up and diagnosis of squamous cell carcinoma felt to be arising from the left lung.  He was treated with systemic therapy and clinically did extremely well, he was found to have recurrence in the brain and was treated with preoperative SRS in May 2021.  Interestingly his surgical resection from his brain tumor in the right temporal lobe showed an adenocarcinoma consistent with non-small cell lung primary.  Dr. 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  He has continued to be followed in brain oncology conference, and in August was found to have a new lesion in the right parietal lobe that was also treated with SRS in 1 fraction.  He had local progressive disease in the summer of 2022 and proceeded with SBRT to the LLL. He tolerated this well. He did have a repeat MRI brain on 09/12/21 and a new 15 mm right frontal metastasis with mild to moderate edema and slight increased changes in the right parietal lesion were noted. No other disease was noted. He is seen today to proceed with simulation for [facility] treatment.    PREVIOUS RADIATION THERAPY: Yes   05/29/2021 through 06/05/2021 SBRT Treatment Site Technique Total Dose (Gy) Dose per Fx (Gy) Completed Fx Beam Energies  Lung, Left:  LLL IMRT 54/54 18 3/3 6XFFF    07/30/20 SRS Treatment: PTV2 Rt Parietal 23m, 20 Gy in 1 fraction  04/18/20 Preoperative SRS Treatment: PTV1 Right Temporal 22 mm target was treated to 15 Gy in 1 fraction    PAST MEDICAL HISTORY:  Past Medical History:  Diagnosis Date   Arthritis    Diabetes [place] 2 [place]    Full dentures    GERD (gastroesophageal reflux disease)    Hyperlipidemia    [place]    [place]    Pneumonia    Wears glasses        PAST SURGICAL HISTORY: Past Surgical History:  Procedure Laterality Date   APPLICATION OF CRANIAL NAVIGATION N/A 04/19/2020   Procedure: APPLICATION OF CRANIAL NAVIGATION;  Surgeon: CKary Kos MD;  [place]  Service: Neurosurgery;  Laterality: N/A;  APPLICATION OF CRANIAL NAVIGATION   COLONOSCOPY W/ BIOPSIES AND POLYPECTOMY     CRANIOTOMY Right 04/19/2020   Procedure: Right Temporal stereotactic craniotomy;  Surgeon: CKary Kos MD;  [place] [place] [place]  Service: Neurosurgery;  Laterality: Right;   FRACTURE SURGERY     right shoulder   MULTIPLE TOOTH EXTRACTIONS       FAMILY HISTORY: History reviewed. No pertinent family history.   SOCIAL HISTORY:  reports that he has quit smoking. His smoking use included cigarettes. He has never used smokeless tobacco. He reports that he  does not currently use alcohol. He reports that he does not use drugs. The patient is married and lives in [place]. He's a retired Administrator.   ALLERGIES: Metformin, Other, Rosuvastatin, and Sulfamethoxazole   MEDICATIONS:  Current Outpatient Medications  Medication Sig Dispense Refill   acetaminophen (TYLENOL) 325 MG tablet Take 650 mg by mouth every 6 (six) hours as needed.     Alogliptin Benzoate 25 MG TABS Take 0.5 tablets by mouth in the morning and at bedtime. 60 tablet 0   calcium-vitamin D (OSCAL WITH D) 500-200 MG-UNIT tablet Take 1 tablet by mouth 2 (two) times daily.     capmatinib (TABRECTA)  200 MG tablet Take 2 tablets (400 mg total) by mouth 2 (two) times daily. 120 tablet 3   dexamethasone (DECADRON) 4 MG tablet Take 1 tablet (4 mg total) by mouth 2 (two) times daily with a meal. 60 tablet 1   esomeprazole (NEXIUM) 40 MG capsule Take 40 mg by mouth daily at 12 noon.     furosemide (LASIX) 20 MG tablet Take 1 tablet (20 mg total) by mouth daily as needed for edema. 30 tablet 5   ibuprofen (ADVIL) 200 MG tablet Take 200 mg by mouth every 6 (six) hours as needed.     levETIRAcetam (KEPPRA) 500 MG tablet Take 500 mg by mouth as directed. Pt to take 1 tab in morning, 1 tab mid day and 2 tabs at bedtime.     LORazepam (ATIVAN) 0.5 MG tablet 1 tab po 30 minutes prior to radiation or MRI 10 tablet 0   potassium chloride (KLOR-CON) 20 MEQ packet Take 20 mEq by mouth daily.     Psyllium (METAMUCIL PO) Take 1 Scoop by mouth daily.     No current facility-administered medications for this encounter.   Facility-Administered Medications Ordered in Other Encounters  Medication Dose Route Frequency Provider Last Rate Last Admin   sodium chloride flush (NS) 0.9 % injection 10 mL  10 mL Intracatheter PRN Mosher, Kelli A, PA-C   10 mL at 09/08/21 0900     REVIEW OF SYSTEMS: On review of systems, the patient reports that he is doing well overall. He denies any new headaches, visual or movement disruptions. He has not been confused per his wife. No other complaints are noted.     PHYSICAL EXAM:  Wt Readings from Last 3 Encounters:  10/02/21 230 lb 4.8 oz (104.5 kg)  09/18/21 236 lb 6.4 oz (107.2 kg)  09/16/21 239 lb 14.4 oz (108.8 kg)   Temp Readings from Last 3 Encounters:  10/02/21 98.1 F (36.7 C)  09/26/21 (!) 97.3 F (36.3 C)  09/18/21 (!) 97.5 F (36.4 C) (Oral)   BP Readings from Last 3 Encounters:  10/02/21 (!) 131/95  09/26/21 (!) 156/93  09/18/21 (!) 149/93   Pulse Readings from Last 3 Encounters:  10/02/21 72  09/26/21 68  09/18/21 74   Pain Assessment Pain Score:  0-No pain/10  In general this is a well appearing caucasian male in no acute distress. He's alert and oriented x4 and appropriate throughout the examination. Cardiopulmonary assessment is negative for acute distress and he exhibits normal effort.     ECOG = 0  0 - Asymptomatic (Fully active, able to carry on all predisease activities without restriction)  1 - Symptomatic but completely ambulatory (Restricted in physically strenuous activity but ambulatory and able to carry out work of a light or sedentary nature. For example, light housework, office work)  2 - Symptomatic, <  50% in bed during the day (Ambulatory and capable of all self care but unable to carry out any work activities. Up and about more than 50% of waking hours)  3 - Symptomatic, >50% in bed, but not bedbound (Capable of only limited self-care, confined to bed or chair 50% or more of waking hours)  4 - Bedbound (Completely disabled. Cannot carry on any self-care. Totally confined to bed or chair)  5 - Death   Eustace Pen MM, Creech RH, Tormey DC, et al. [phone]). "Toxicity and response criteria of the [facility]". [place] Oncol. 5 (6): 649-55    LABORATORY DATA:  Lab Results  Component Value Date   WBC 7.6 09/08/2021   HGB 15.4 09/08/2021   HCT 47 09/08/2021   MCV 90 09/08/2021   PLT 158 09/08/2021   Lab Results  Component Value Date   NA 136 (A) 09/08/2021   K 3.7 09/08/2021   CL 102 09/08/2021   CO2 29 (A) 09/08/2021   Lab Results  Component Value Date   ALT 19 09/08/2021   AST 24 09/08/2021   ALKPHOS 53 09/08/2021   BILITOT 0.7 10/08/2020      RADIOGRAPHY: MR BRAIN W WO CONTRAST  Result Date: 09/13/2021 CLINICAL DATA:  Brain/CNS neoplasm, assess treatment response. Metastatic lung cancer. EXAM: MRI HEAD WITHOUT AND WITH CONTRAST TECHNIQUE: Multiplanar, multiecho pulse sequences of the brain and surrounding structures were obtained without and with intravenous contrast. CONTRAST:   71m MULTIHANCE GADOBENATE DIMEGLUMINE 529 MG/ML IV SOLN Contrast was administered via a port which was accessed by a rEquities trader COMPARISON:  04/25/2021 FINDINGS: Brain: New lesions: 1. 15 mm lesion with irregular peripheral enhancement in the posterior right frontal lobe with mild to moderate edema (series 11, image 117). Larger lesions: 1. 4 mm enhancing subcortical lesion in the right parietal lobe (series 11, image 121, previously 2 mm). Stable or smaller lesions: 1. Nonprogressive enhancement at the right temporal resection site with unchanged mild edema (series 11, image 73). Other brain findings: No acute infarct, midline shift, or extra-axial fluid collection is identified. A small amount of chronic blood products are noted at the right temporal resection site. Small T2 hyperintensities elsewhere in the cerebral white matter bilaterally are unchanged and nonspecific but compatible with mild-to-moderate chronic small vessel ischemic disease. There is mild-to-moderate cerebral atrophy. Vascular: Major intracranial vascular flow voids are preserved. Skull and upper cervical spine: Right temporal craniotomy. No suspicious marrow lesion. Sinuses/Orbits: Unremarkable orbits. Paranasal sinuses and mastoid air cells are clear. Other: None. IMPRESSION: 1. New 15 mm right frontal metastasis with mild to moderate edema. 2. Slightly increased size of 4 mm right parietal metastasis. 3. Nonprogressive enhancement at the right temporal resection site. Electronically Signed   By: ALogan BoresM.D.   On: 09/13/2021 11:55       IMPRESSION/PLAN: 1. Stage IV, NSCLC, SCC of the LLL with metastatic disease to the acromion and metastatic adenocarcinoma felt to be lung primary to the brain. His case was discussed with Dr. MLisbeth Renshawand Dr. VMickeal Skinnerand he would benefit [facility] the new lesion in the brain. He is seen today and we reviewed the risks, benefits, short, and long term effects of radiotherapy. Written consent is  obtained and placed in the chart, a copy was provided to the patient. He will simulate today.   In a visit lasting 35 minutes, greater than 50% of the time was spent face to face discussing the patient's condition, in preparation  for the discussion, and coordinating the patient's care.       Carola Rhine, [facility]   **Disclaimer: This note was dictated with voice recognition software. Similar sounding words can inadvertently be transcribed and this note may contain transcription errors which may not have been corrected upon publication of note.**

## 2021-10-05 NOTE — Addendum Note (Signed)
Encounter addended by: Hayden Pedro, PA-C on: 10/05/2021 6:48 AM  Actions taken: Clinical Note Signed, Level of Service modified

## 2021-10-10 ENCOUNTER — Inpatient Hospital Stay: Payer: No Typology Code available for payment source | Attending: Internal Medicine | Admitting: Internal Medicine

## 2021-10-10 DIAGNOSIS — C7931 Secondary malignant neoplasm of brain: Secondary | ICD-10-CM

## 2021-10-10 MED ORDER — DEXAMETHASONE 1 MG PO TABS: ORAL_TABLET | ORAL | 1 refills | Status: DC

## 2021-10-10 NOTE — Progress Notes (Signed)
I connected with Alex Cordova on 10/10/21 at 10:00 AM EDT by telephone visit and verified that I am speaking with the correct person using two identifiers.  I discussed the limitations, risks, security and privacy concerns of performing an evaluation and management service by telemedicine and the availability of in-person appointments. I also discussed with the patient that there may be a patient responsible charge related to this service. The patient expressed understanding and agreed to proceed.  Other persons participating in the visit and their role in the encounter:  n/a  Patient's location:  Home  Provider's location:  Office  Chief Complaint:  Brain metastasis ([place])  History of Present Ilness: Alex Cordova describes improvement in balance, cognition and language since starting the steroid last week.  Unfortunately, he did develop severe diarrhea, which he attributed to his medications.  He stopped all meds, symptoms improved as of yesterday.  He is now resuming his medications.  No other new complaints. Observations: Language and cognition at baseline Assessment and Plan: Brain metastasis ([place])  Follow Up Instructions: Recommended resuming decadron, but decreasing dose to 2mg  daily.  After 14 days, can reduce dose down to 1mg  daily, then stop therapy after 14 additional days, if tolerated  I discussed the assessment and treatment plan with the patient.  The patient was provided an opportunity to ask questions and all were answered.  The patient agreed with the plan and demonstrated understanding of the instructions.    He understands the importance of resuming his Keppra 500/1000.  The patient was advised to call back or seek an in-person evaluation if the symptoms worsen or if the condition fails to improve as anticipated.  I provided 5-10 minutes of non-face-to-face time during this enocunter.  Ventura Sellers, MD  I provided 15 minutes of non face-to-face telephone  visit time during this encounter, and > 50% was spent counseling as documented under my assessment & plan.

## 2021-10-14 NOTE — Procedures (Signed)
  Name: Alex Cordova  MRN: [id number]  Date: 09/26/2021   DOB: [birth year]  Stereotactic Radiosurgery Operative Note  PRE-OPERATIVE DIAGNOSIS:  Solitary Brain Metastasis  POST-OPERATIVE DIAGNOSIS:  Solitary Brain Metastasis  PROCEDURE:  Stereotactic Radiosurgery  SURGEON:  Elaina Hoops, MD  NARRATIVE: The patient underwent a radiation treatment planning session in the radiation oncology simulation suite under the care of the radiation oncology physician and physicist.  I participated closely in the radiation treatment planning afterwards. The patient underwent planning CT which was fused to 3T high resolution MRI with 1 mm axial slices.  These images were fused on the planning system.  We contoured the gross target volumes and subsequently expanded this to yield the Planning Target Volume. I actively participated in the planning process.  I helped to define and review the target contours and also the contours of the optic pathway, eyes, brainstem and selected nearby organs at risk.  All the dose constraints for critical structures were reviewed and compared to AAPM Task Group 101.  The prescription dose conformity was reviewed.  I approved the plan electronically.    Accordingly, [name] was brought to the TrueBeam stereotactic radiation treatment linac and placed in the custom immobilization mask.  The patient was aligned according to the IR fiducial markers with BrainLab Exactrac, then orthogonal x-rays were used in ExacTrac with the 6DOF robotic table and the shifts were made to align the patient  [name] received stereotactic radiosurgery uneventfully.    The detailed description of the procedure is recorded in the radiation oncology procedure note.  I was present for the duration of the procedure.  DISPOSITION:  Following delivery, the patient was transported to nursing in stable condition and monitored for possible acute effects to be discharged to home in  stable condition with follow-up in one month.  Elaina Hoops, MD 10/14/2021 2:35 PM

## 2021-10-27 ENCOUNTER — Ambulatory Visit
Admission: RE | Admit: 2021-10-27 | Discharge: 2021-10-27 | Disposition: A | Discharge status: Home / Self Care | Payer: No Typology Code available for payment source | Source: Ambulatory Visit | Attending: Radiation Oncology | Admitting: Radiation Oncology

## 2021-10-27 DIAGNOSIS — C7931 Secondary malignant neoplasm of brain: Secondary | ICD-10-CM

## 2021-10-28 NOTE — Progress Notes (Signed)
  Radiation Oncology         [phone] ________________________________  Name: Alex Cordova MRN: [id number]  Date of Service: 10/27/2021  DOB: October 12, 1946  Post Treatment Telephone Note  Diagnosis:   Stage IV, NSCLC, SCC of the LLL with metastatic disease to the acromion and metastatic adenocarcinoma felt to be lung primary to the brain  Interval Since Last Radiation:  5 weeks   09/26/2021 SRS Treatment Site Technique Total Dose (Gy) Dose per Fx (Gy) Completed Fx Beam Energies  Brain:  [id number] IMRT 20/20 20 1/1 [id number]    Narrative:  The patient was contacted today for routine follow-up. During treatment he did very well with radiotherapy and did not have significant desquamation.    Impression/Plan: 1. Stage IV, NSCLC, SCC of the LLL with metastatic disease to the acromion and metastatic adenocarcinoma felt to be lung primary to the brain. I was unable to reach the patient but left a voicemail and on the message, I  discussed that we would be happy to continue to follow him as needed, but he will also continue to follow up with Dr. Mickeal Skinner in neuro oncology as well as Dr. Bobby Rumpf in medical oncology.      Carola Rhine, PAC

## 2021-11-03 NOTE — Progress Notes (Signed)
[facility]  [address] [place],  [place]  [address] [phone]  Clinic Day:  11/10/2021  Referring physician: Landry Mellow, MD  This document serves as a record of services personally performed by Dequincy Macarthur Critchley, MD. It was created on their behalf by [facility], a trained medical scribe. The creation of this record is based on the scribe's personal observations and the provider's statements to them.  HISTORY OF PRESENT ILLNESS:  The patient is a 75 y.o. male with metastatic non-small cell lung cancer, which includes brain metastasis and a metastatic focus of disease to his left acromion process.  His initial lung cancer pathology showed squamous cell carcinoma.  However, pathology from his brain resection done earlier this year showed adenocarcinoma. 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 He has also undergone stereotactic radiation in [place] to his left lung mass. He comes in today for routine follow up.  Since his last visit, the patient has been doing well.  Of note, a brain MRI done in October 2021 showed a new metastatic lesion, which led to him undergoing stereotactic radiation to his brain in late October 2022.  He also takes Keppra to prevent seizures from redeveloping.  Of note, he denies having any further seizures since his brain lesion was recently treated.  He readily admits that he is not compliant with his Keppra on a daily basis due to how it makes him feel.  He continues to take Decadron as prescribed per radiation oncology to address the regional swelling his metastatic brain lesion caused.  He is scheduled to finish his Decadron later this week.  Otherwise, he denies having any new systemic symptoms which concern him for disease progression.  He has persistent fluid retention in his legs, which is likely related to both his  capmatinib and Decadron.  PHYSICAL EXAM:  Blood pressure (!) 160/83, pulse (!) 59, temperature 97.8 F (36.6 C), resp. rate 16, height 5' 10.5" (1.791 m), weight 240 lb 9.6 oz (109.1 kg), SpO2 94 %. Wt Readings from Last 3 Encounters:  11/10/21 240 lb 9.6 oz (109.1 kg)  10/02/21 230 lb 4.8 oz (104.5 kg)  09/18/21 236 lb 6.4 oz (107.2 kg)   Body mass index is 34.03 kg/m. Performance status: 1 Physical Exam Constitutional:      Appearance: Normal appearance. He is not ill-appearing.  HENT:     Mouth/Throat:     Mouth: Mucous membranes are moist.     Pharynx: Oropharynx is clear. No oropharyngeal exudate or posterior oropharyngeal erythema.  Cardiovascular:     Rate and Rhythm: Regular rhythm. Bradycardia present.     Heart sounds: No murmur heard.   No friction rub. No gallop.  Pulmonary:     Effort: Pulmonary effort is normal. No respiratory distress.     Breath sounds: Normal breath sounds. No wheezing, rhonchi or rales.  Abdominal:     General: Bowel sounds are normal. There is no distension.     Palpations: Abdomen is soft. There is no mass.     Tenderness: There is no abdominal tenderness.  Musculoskeletal:        General: No swelling.     Right lower leg: No edema.     Left lower leg: No edema.  Lymphadenopathy:     Cervical: No cervical adenopathy.     Upper Body:     Right  upper body: No supraclavicular or axillary adenopathy.     Left upper body: No supraclavicular or axillary adenopathy.     Lower Body: No right inguinal adenopathy. No left inguinal adenopathy.  Skin:    General: Skin is warm.     Coloration: Skin is not jaundiced.     Findings: No lesion or rash.  Neurological:     General: No focal deficit present.     Mental Status: He is alert and oriented to person, place, and time. Mental status is at baseline.  Psychiatric:        Mood and Affect: Mood normal.        Behavior: Behavior normal.        Thought Content: Thought content normal.     ASSESSMENT & PLAN:  Assessment/Plan:  A 75 y.o. 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  His initial biopsy revealed squamous cell carcinoma, but his metastatic temporal lobe lesion showed adenocarcinoma.  The patient will continue taking his capmatinib twice daily for his disease management.  Clinically, he appears to be doing well.  In fact, his wife claims he is doing the best he has in numerous years.  He knows it remains important for him to stick with the plan per neurooncology, which is for him to take his Keppra on a daily basis.  As mentioned previously, he is scheduled to be weaned off Decadron in the forthcoming days.  With respect to his fluid retention, I will represcribe this gentlemen's Lasix, which he knows to take 20 mg daily prn.  Otherwise, I will see this patient back in 2 months for repeat clinical assessment.  Repeat CT scans of his chest/abdomen/pelvis will be done a day before his next visit to ascertain his new disease baseline after having completed 15 months of capmatinib oral therapy.  The patient understands all the plans discussed today and is in agreement with them.     I, Rita Ohara, am acting as scribe for Marice Potter, MD    I have reviewed this report as typed by the medical scribe, and it is complete and accurate.  Dequincy Macarthur Critchley, MD

## 2021-11-10 ENCOUNTER — Other Ambulatory Visit: Payer: Self-pay | Admitting: Hematology and Oncology

## 2021-11-10 ENCOUNTER — Inpatient Hospital Stay: Payer: No Typology Code available for payment source | Attending: Internal Medicine

## 2021-11-10 ENCOUNTER — Inpatient Hospital Stay (INDEPENDENT_AMBULATORY_CARE_PROVIDER_SITE_OTHER): Payer: No Typology Code available for payment source | Admitting: Oncology

## 2021-11-10 ENCOUNTER — Other Ambulatory Visit: Payer: Self-pay | Admitting: Oncology

## 2021-11-10 VITALS — BP 160/83 | HR 59 | Temp 97.8°F | Resp 16 | Ht 70.5 in | Wt 240.6 lb

## 2021-11-10 DIAGNOSIS — C3412 Malignant neoplasm of upper lobe, left bronchus or lung: Secondary | ICD-10-CM | POA: Diagnosis not present

## 2021-11-10 DIAGNOSIS — C7931 Secondary malignant neoplasm of brain: Secondary | ICD-10-CM | POA: Diagnosis not present

## 2021-11-10 LAB — BASIC METABOLIC PANEL
BUN: 16 (ref 4–21)
CO2: 32 — AB (ref 13–22)
Chloride: 103 (ref 99–108)
Creatinine: 1.2 (ref 0.6–1.3)
Glucose: 93
Potassium: 3.7 (ref 3.4–5.3)
Sodium: 139 (ref 137–147)

## 2021-11-10 LAB — CBC
MCV: 89 (ref 80–94)
RBC: 5.31 — AB (ref 3.87–5.11)

## 2021-11-10 LAB — CBC AND DIFFERENTIAL
HCT: 47 (ref 41–53)
Hemoglobin: 15.6 (ref 13.5–17.5)
Neutrophils Absolute: 6.86
Platelets: 155 (ref 150–399)
WBC: 9.8

## 2021-11-10 LAB — COMPREHENSIVE METABOLIC PANEL
Albumin: 3.3 — AB (ref 3.5–5.0)
Calcium: 8.2 — AB (ref 8.7–10.7)

## 2021-11-10 LAB — HEPATIC FUNCTION PANEL
ALT: 28 (ref 10–40)
AST: 25 (ref 14–40)
Alkaline Phosphatase: 56 (ref 25–125)
Bilirubin, Total: 0.6

## 2021-11-10 LAB — PROTEIN, TOTAL: Total Protein: 5.8 g/dL — AB (ref 6.3–8.2)

## 2021-11-10 MED ORDER — FUROSEMIDE 20 MG PO TABS: ORAL_TABLET | ORAL | 3 refills | Status: DC

## 2021-11-12 ENCOUNTER — Other Ambulatory Visit: Payer: Self-pay | Admitting: Radiation Therapy

## 2021-11-12 DIAGNOSIS — C7931 Secondary malignant neoplasm of brain: Secondary | ICD-10-CM

## 2021-11-12 NOTE — Progress Notes (Signed)
Orders placed for port access the day of brain MRI at [facility].  Mont Dutton R.T.(R)(T)

## 2021-12-17 ENCOUNTER — Encounter: Payer: Self-pay | Admitting: Hematology and Oncology

## 2021-12-17 ENCOUNTER — Other Ambulatory Visit: Payer: Self-pay | Admitting: Hematology and Oncology

## 2021-12-17 ENCOUNTER — Telehealth: Payer: Self-pay | Admitting: Hematology and Oncology

## 2021-12-17 ENCOUNTER — Inpatient Hospital Stay (INDEPENDENT_AMBULATORY_CARE_PROVIDER_SITE_OTHER): Payer: No Typology Code available for payment source | Admitting: Hematology and Oncology

## 2021-12-17 ENCOUNTER — Inpatient Hospital Stay: Payer: No Typology Code available for payment source | Attending: Internal Medicine

## 2021-12-17 ENCOUNTER — Other Ambulatory Visit: Payer: Self-pay

## 2021-12-17 ENCOUNTER — Telehealth: Payer: Self-pay

## 2021-12-17 ENCOUNTER — Inpatient Hospital Stay: Payer: No Typology Code available for payment source

## 2021-12-17 VITALS — BP 133/96 | HR 81 | Temp 98.3°F | Resp 20 | Ht 70.5 in | Wt 249.5 lb

## 2021-12-17 VITALS — BP 136/95 | HR 50 | Temp 98.0°F | Resp 18 | Ht 70.5 in | Wt 250.5 lb

## 2021-12-17 DIAGNOSIS — C7951 Secondary malignant neoplasm of bone: Secondary | ICD-10-CM | POA: Diagnosis not present

## 2021-12-17 DIAGNOSIS — Z87891 Personal history of nicotine dependence: Secondary | ICD-10-CM | POA: Insufficient documentation

## 2021-12-17 DIAGNOSIS — N5089 Other specified disorders of the male genital organs: Secondary | ICD-10-CM | POA: Diagnosis not present

## 2021-12-17 DIAGNOSIS — C3412 Malignant neoplasm of upper lobe, left bronchus or lung: Secondary | ICD-10-CM | POA: Diagnosis not present

## 2021-12-17 DIAGNOSIS — C7931 Secondary malignant neoplasm of brain: Secondary | ICD-10-CM | POA: Diagnosis present

## 2021-12-17 DIAGNOSIS — C3490 Malignant neoplasm of unspecified part of unspecified bronchus or lung: Secondary | ICD-10-CM | POA: Diagnosis not present

## 2021-12-17 DIAGNOSIS — Z79899 Other long term (current) drug therapy: Secondary | ICD-10-CM | POA: Diagnosis not present

## 2021-12-17 DIAGNOSIS — R609 Edema, unspecified: Secondary | ICD-10-CM

## 2021-12-17 LAB — BASIC METABOLIC PANEL
BUN: 11 (ref 4–21)
CO2: 25 — AB (ref 13–22)
Chloride: 105 (ref 99–108)
Creatinine: 1.2 (ref 0.6–1.3)
Glucose: 104
Potassium: 3.7 (ref 3.4–5.3)
Sodium: 136 — AB (ref 137–147)

## 2021-12-17 LAB — HEPATIC FUNCTION PANEL
ALT: 26 (ref 10–40)
AST: 46 — AB (ref 14–40)
Alkaline Phosphatase: 59 (ref 25–125)
Bilirubin, Total: 0.8

## 2021-12-17 LAB — CBC AND DIFFERENTIAL
HCT: 49 (ref 41–53)
Hemoglobin: 16.3 (ref 13.5–17.5)
Neutrophils Absolute: 4.44
Platelets: 202 (ref 150–399)
WBC: 7.4

## 2021-12-17 LAB — COMPREHENSIVE METABOLIC PANEL
Albumin: 3.1 — AB (ref 3.5–5.0)
Calcium: 8.4 — AB (ref 8.7–10.7)

## 2021-12-17 LAB — CBC: RBC: 5.54 — AB (ref 3.87–5.11)

## 2021-12-17 MED ORDER — HEPARIN SOD (PORK) LOCK FLUSH 100 UNIT/ML IV SOLN
500.0000 [IU] | Freq: Once | INTRAVENOUS | Status: AC | PRN
  Administered 2021-12-17: 500 [IU]

## 2021-12-17 MED ORDER — SODIUM CHLORIDE 0.9% FLUSH
10.0000 mL | Freq: Once | INTRAVENOUS | Status: AC | PRN
  Administered 2021-12-17: 10 mL

## 2021-12-17 MED ORDER — FUROSEMIDE 10 MG/ML IJ SOLN
40.0000 mg | Freq: Once | INTRAMUSCULAR | Status: AC
  Administered 2021-12-17: 40 mg via INTRAVENOUS
  Filled 2021-12-17: qty 4

## 2021-12-17 NOTE — Assessment & Plan Note (Addendum)
He has had increasing edema since beginning capmatinib. It started bilateral feet, ankles and legs and has increased to his upper legs and groin making it difficult for him to urinate. He has been taking lasix 20 mg twice daily without relief. He does have some increased shortness of breath upon exertion. He denies any history of heart disease, specifically congestive heart failure. We will hold his capmatinib for one week, give lasix 40 mg IV today and increase potassium to 40 mEq daily x 5 days. We will also obtain ECHO prior to next appointment. He will return to clinic in one week for repeat evaluation.

## 2021-12-17 NOTE — Telephone Encounter (Signed)
Pt called, appt given for today @ 1pm.  RE: Swelling in legs, hands, and arms Received: Today Melodye Ped, NP  Dairl Ponder, RN We need to see him.   12/17/2021 @ 1030 -Pt has called this morning to f/u on call they left on Monday. Pt states his legs, arms, hands and now groin area are swollen bad. He states he has hard time urinating @ times.  ===View-only below this line=== ----- Message ----- From: Dairl Ponder, RN Sent: 12/15/2021   9:53 AM EST To: Melodye Ped, NP Subject: Swelling in legs, hands, and arms              Pt's swelling in has increased. He takes Lasix 20mg  po BID. Pt's wife is asking if that is enough Lasix? He is also taking 1 potassium tab daily. He remains on Tabrecta 200mg  2 tab po BID (swelling is side effect of this drug). His normal weight usually runs in 230s per wife. He has been running in the 240s.

## 2021-12-17 NOTE — Progress Notes (Signed)
Patient Care Team: Landry Mellow, MD as PCP - General (Family Medicine) Alex Potter, MD as Consulting Physician (Oncology)  Clinic Day:  12/17/2021  Referring physician: Landry Mellow, MD  ASSESSMENT & PLAN:   Assessment & Plan: Edema He has had increasing edema since beginning capmatinib. It started bilateral feet, ankles and legs and has increased to his upper legs and groin making it difficult for him to urinate. He has been taking lasix 20 mg twice daily without relief. He does have some increased shortness of breath upon exertion. He denies any history of heart disease, specifically congestive heart failure. We will hold his capmatinib for one week, give lasix 40 mg IV today and increase potassium to 40 mEq daily x 5 days. We will also obtain ECHO prior to next appointment. He will return to clinic in one week for repeat evaluation.    The patient understands the plans discussed today and is in agreement with them.  He knows to contact our office if he develops concerns prior to his next appointment.    Melodye Ped, NP  [facility] [address] [place] [address] Dept: [phone] Dept Fax: [phone]   Orders Placed This Encounter  Procedures   CBC and differential    This external order was created through the Results Console.   CBC    This external order was created through the Results Console.   Basic metabolic panel    This external order was created through the Results Console.   Comprehensive metabolic panel    This external order was created through the Results Console.   Hepatic function panel    This external order was created through the Results Console.   ECHOCARDIOGRAM COMPLETE    Standing Status:   Future    Standing Expiration Date:   01/17/2022    Order Specific Question:   Where should this test be performed    Answer:   External    Order Specific Question:    Perflutren DEFINITY (image enhancing agent) should be administered unless hypersensitivity or allergy exist    Answer:   Administer Perflutren    Order Specific Question:   Reason for exam-Echo    Answer:   Dyspnea  R06.00      CHIEF COMPLAINT:  CC: [name] with history of lung cancer metastasized to the brain here for symptom management mostly edema  Current Treatment:  Capmatinib  INTERVAL HISTORY:  Alex Cordova is here today for repeat clinical assessment. He denies fevers or chills. He denies pain. His appetite is good. His weight has increased 10 pounds over last 5 weeks .  I have reviewed the past medical history, past surgical history, social history and family history with the patient and they are unchanged from previous note.  ALLERGIES:  is allergic to metformin, misc. sulfonamide containing compounds, other, rosuvastatin, and sulfamethoxazole.  MEDICATIONS:  Current Outpatient Medications  Medication Sig Dispense Refill   Alogliptin Benzoate 25 MG TABS TAKE ONE-HALF TABLET BY MOUTH DAILY FOR DIABETES     Calcium Carb-Cholecalciferol 600-10 MG-MCG TABS TAKE 1 TABLET BY MOUTH EVERY MORNING AFTER BREAKFAST     cetirizine (ZYRTEC) 10 MG tablet Take 1 tablet by mouth daily.     Hypertonic Nasal Wash (SINUS RINSE BOTTLE KIT NA) 2 (two) times daily.     ipratropium (ATROVENT) 0.03 % nasal spray SPRAY 2 SPRAYS INTO EACH NOSTRIL EVERY 8 TO 12 HOURS  potassium chloride SA (KLOR-CON M) 20 MEQ tablet TAKE ONE TABLET BY MOUTH DAILY (TAKE WITH FOOD)     pravastatin (PRAVACHOL) 40 MG tablet TAKE ONE TABLET BY MOUTH AT BEDTIME FOR CHOLESTEROL     acetaminophen (TYLENOL) 325 MG tablet Take 650 mg by mouth every 6 (six) hours as needed.     capmatinib (TABRECTA) 200 MG tablet Take 2 tablets (400 mg total) by mouth 2 (two) times daily. 120 tablet 3   esomeprazole (NEXIUM) 40 MG capsule Take 40 mg by mouth daily at 12 noon.     furosemide (LASIX) 20 MG tablet Take as needed for  swelling (Patient taking differently: 40 mg daily. Take as needed for swelling) 30 tablet 3   ibuprofen (ADVIL) 200 MG tablet Take 200 mg by mouth every 6 (six) hours as needed.     levETIRAcetam (KEPPRA) 500 MG tablet Take 500 mg by mouth as directed. Pt to take 1 tab in morning, 1 tab mid day and 2 tabs at bedtime. (Patient not taking: Reported on 12/17/2021)     LORazepam (ATIVAN) 0.5 MG tablet 1 tab po 30 minutes prior to radiation or MRI 10 tablet 0   Psyllium (METAMUCIL PO) Take 1 Scoop by mouth daily.     No current facility-administered medications for this visit.   Facility-Administered Medications Ordered in Other Visits  Medication Dose Route Frequency Provider Last Rate Last Admin   sodium chloride flush (NS) 0.9 % injection 10 mL  10 mL Intracatheter PRN Mosher, Kelli A, PA-C   10 mL at 09/08/21 0900    HISTORY OF PRESENT ILLNESS:   Oncology History   No history exists.      REVIEW OF SYSTEMS:   Constitutional: Denies fevers, chills or abnormal weight loss Eyes: Denies blurriness of vision Ears, nose, mouth, throat, and face: Denies mucositis or sore throat Respiratory: Denies cough, dyspnea or wheezes Cardiovascular: Denies palpitation, chest discomfort or lower extremity swelling Gastrointestinal:  Denies nausea, heartburn or change in bowel habits Skin: Denies abnormal skin rashes Lymphatics: Denies new lymphadenopathy or easy bruising Neurological:Denies numbness, tingling or new weaknesses Behavioral/Psych: Mood is stable, no new changes  All other systems were reviewed with the patient and are negative.   VITALS:  Blood pressure (!) 133/96, pulse 81, temperature 98.3 F (36.8 C), temperature source Oral, resp. rate 20, height 5' 10.5" (1.791 m), weight 249 lb 8 oz (113.2 kg), SpO2 91 %.  Wt Readings from Last 3 Encounters:  12/17/21 249 lb 8 oz (113.2 kg)  11/10/21 240 lb 9.6 oz (109.1 kg)  10/02/21 230 lb 4.8 oz (104.5 kg)    Body mass index is 35.29  kg/m.  Performance status (ECOG): 1 - Symptomatic but completely ambulatory  PHYSICAL EXAM:   GENERAL:alert, no distress and comfortable SKIN: skin color, texture, turgor are normal, no rashes or significant lesions EYES: normal, Conjunctiva are pink and non-injected, sclera clear OROPHARYNX:no exudate, no erythema and lips, buccal mucosa, and tongue normal  NECK: supple, thyroid normal size, non-tender, without nodularity LYMPH:  no palpable lymphadenopathy in the cervical, axillary or inguinal LUNGS: clear to auscultation and percussion with normal breathing effort HEART: bilateral lower extremity edema up to groin and bilateral hands; 3+ pitting edema ABDOMEN:abdomen soft, non-tender and normal bowel sounds Musculoskeletal:no cyanosis of digits and no clubbing  NEURO: alert & oriented x 3 with fluent speech, no focal motor/sensory deficits  LABORATORY DATA:  I have reviewed the data as listed    Component Value Date/Time  NA 136 (A) 12/17/2021 0000   K 3.7 12/17/2021 0000   CL 105 12/17/2021 0000   CO2 25 (A) 12/17/2021 0000   GLUCOSE 133 (H) 10/08/2020 0919   BUN 11 12/17/2021 0000   CREATININE 1.2 12/17/2021 0000   CREATININE 1.38 (H) 10/08/2020 0919   CALCIUM 8.4 (A) 12/17/2021 0000   PROT 5.8 (A) 11/10/2021 0000   ALBUMIN 3.1 (A) 12/17/2021 0000   AST 46 (A) 12/17/2021 0000   AST 23 10/08/2020 0919   ALT 26 12/17/2021 0000   ALT 26 10/08/2020 0919   ALKPHOS 59 12/17/2021 0000   BILITOT 0.7 10/08/2020 0919   GFRNONAA 53 (L) 10/08/2020 0919   GFRAA >60 07/23/2020 1253    No results found for: SPEP, UPEP  Lab Results  Component Value Date   WBC 7.4 12/17/2021   NEUTROABS 4.44 12/17/2021   HGB 16.3 12/17/2021   HCT 49 12/17/2021   MCV 89 11/10/2021   PLT 202 12/17/2021      Chemistry      Component Value Date/Time   NA 136 (A) 12/17/2021 0000   K 3.7 12/17/2021 0000   CL 105 12/17/2021 0000   CO2 25 (A) 12/17/2021 0000   BUN 11 12/17/2021 0000    CREATININE 1.2 12/17/2021 0000   CREATININE 1.38 (H) 10/08/2020 0919   GLU 104 12/17/2021 0000      Component Value Date/Time   CALCIUM 8.4 (A) 12/17/2021 0000   ALKPHOS 59 12/17/2021 0000   AST 46 (A) 12/17/2021 0000   AST 23 10/08/2020 0919   ALT 26 12/17/2021 0000   ALT 26 10/08/2020 0919   BILITOT 0.7 10/08/2020 0919       RADIOGRAPHIC STUDIES: I have personally reviewed the radiological images as listed and agreed with the findings in the report. No results found.

## 2021-12-17 NOTE — Patient Instructions (Signed)
Furosemide IV What is this medication? FUROSEMIDE (fyoor OH se mide) treats high blood pressure. It may also be used to reduce swelling related to heart, kidney, or liver disease. It helps your kidneys remove more fluid and salt from your blood through the urine. It belongs to a group of medications called diuretics. This medicine may be used for other purposes; ask your health care provider or pharmacist if you have questions. COMMON BRAND NAME(S): Active-Medicated Specimen Kit, Delone, Diuscreen, Lasix, RX Specimen Collection Kit, Specimen Collection Kit, URINX Medicated Specimen Collection What should I tell my care team before I take this medication? They need to know if you have any of these conditions: Abnormal blood electrolytes Diarrhea or vomiting Gout Heart disease Kidney disease, small amounts of urine, or difficulty passing urine Liver disease Thyroid disease An unusual or allergic reaction to furosemide, sulfa medications, other medications, foods, dyes, or preservatives Pregnant or trying to get pregnant Breast-feeding How should I use this medication? Take this medication by mouth. Take it as directed on the prescription label at the same time every day. You can take it with or without food. If it upsets your stomach, take it with food. Keep taking it unless your care team tells you to stop. Talk to your care team about the use of this medication in children. Special care may be needed. Overdosage: If you think you have taken too much of this medicine contact a poison control center or emergency room at once. NOTE: This medicine is only for you. Do not share this medicine with others. What if I miss a dose? If you miss a dose, take it as soon as you can. If it is almost time for your next dose, take only that dose. Do not take double or extra doses. What may interact with this medication? Aspirin and aspirin-like medications Certain antibiotics Chloral  hydrate Cisplatin Cyclosporine Digoxin Diuretics Laxatives Lithium Medications for blood pressure Medications that relax muscles for surgery Methotrexate NSAIDs, medications for pain and inflammation like ibuprofen, naproxen, or indomethacin Phenytoin Steroid medications like prednisone or cortisone Sucralfate Thyroid hormones This list may not describe all possible interactions. Give your health care provider a list of all the medicines, herbs, non-prescription drugs, or dietary supplements you use. Also tell them if you smoke, drink alcohol, or use illegal drugs. Some items may interact with your medicine. What should I watch for while using this medication? Visit your care team for regular checks on your progress. Check your blood pressure regularly. Ask your care team what your blood pressure should be, and when you should contact him or her. If you are a diabetic, check your blood sugar as directed. This medication may cause serious skin reactions. They can happen weeks to months after starting the medication. Contact your care team right away if you notice fevers or flu-like symptoms with a rash. The rash may be red or purple and then turn into blisters or peeling of the skin. Or, you might notice a red rash with swelling of the face, lips or lymph nodes in your neck or under your arms. You may need to be on a special diet while taking this medication. Check with your care team. Also, ask how many glasses of fluid you need to drink a day. You must not get dehydrated. You may get drowsy or dizzy. Do not drive, use machinery, or do anything that needs mental alertness until you know how this medication affects you. Do not stand or sit up quickly,  especially if you are an older patient. This reduces the risk of dizzy or fainting spells. Alcohol can make you more drowsy and dizzy. Avoid alcoholic drinks. This medication can make you more sensitive to the sun. Keep out of the sun. If you cannot  avoid being in the sun, wear protective clothing and use sunscreen. Do not use sun lamps or tanning beds/booths. What side effects may I notice from receiving this medication? Side effects that you should report to your care team as soon as possible: Allergic reactions--skin rash, itching, hives, swelling of the face, lips, tongue, or throat Dehydration--increased thirst, dry mouth, feeling faint or lightheaded, headache, dark yellow or brown urine Hearing loss, ringing in ears High blood sugar (hyperglycemia)--increased thirst or amount of urine, unusual weakness or fatigue, blurry vision Low blood pressure--dizziness, feeling faint or lightheaded, blurry vision Low potassium level--muscle pain or cramps, unusual weakness or fatigue, fast or irregular heartbeat, constipation Side effects that usually do not require medical attention (report to your care team if they continue or are bothersome): Burning or tingling sensation in hands or feet Constipation Diarrhea Dizziness Headache This list may not describe all possible side effects. Call your doctor for medical advice about side effects. You may report side effects to FDA at 1-800-FDA-1088. Where should I keep my medication? Keep out of the reach of children and pets. Store at room temperature between 20 and 25 degrees C (68 and 77 degrees F). Protect from light and moisture. Keep the container tightly closed. Throw away any unused medication after the expiration date. NOTE: This sheet is a summary. It may not cover all possible information. If you have questions about this medicine, talk to your doctor, pharmacist, or health care provider.  2022 Elsevier/Gold Standard (2021-08-12 00:00:00)

## 2021-12-17 NOTE — Telephone Encounter (Signed)
Per 1/11 los next appt scheduled and given to patient

## 2021-12-18 NOTE — Progress Notes (Signed)
[facility]  [address] [place],  [place]  [address] [phone]  Clinic Day:  12/24/2021  Referring physician: Landry Mellow, MD  This document serves as a record of services personally performed by Zaire Levesque Macarthur Critchley, MD. It was created on their behalf by [facility] E, a trained medical scribe. The creation of this record is based on the scribe's personal observations and the provider's statements to them.  HISTORY OF PRESENT ILLNESS:  The patient is a 76 y.o. male with metastatic non-small cell lung cancer, which includes brain metastasis and a metastatic focus of disease to his left acromion process.  The patient comes in today to be reassessed after presenting to clinic last week severely edematous.  He definitely attributes his fluid retention to his capmatinib oral therapy.  It has been held for the past week while his Lasix was increased to 40 mg daily.  He was also given a 40 mg IV dose of Lasix to help increase his fluid mobilization.  Overall, the patient claims to be feeling much better.  He has definitely noticed an improvement with his generalized swelling.  The only area where there is still a modest degree of edema is within his testicles.  As persistent fluid retention has been a problem over these past few months and has impacted his daily quality of life, he wishes to hold his capmatinib over these next few weeks.  With respect to his lung cancer history, his initial lung cancer pathology showed squamous cell carcinoma.  However, pathology from his brain resection in 2022 showed adenocarcinoma. He initially receiving carboplatin/paclitaxel/pembrolizumab for his disease management before being switched to maintenance pembrolizumab.   He stay on this agent for numerous months, which helped with his disease control.  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 He has also undergone stereotactic radiation in [place] to his primary left lung mass.   PHYSICAL EXAM:  Blood pressure (!) 175/102, pulse 76, temperature 98.9 F (37.2 C), resp. rate 18, height 5' 10.5" (1.791 m), weight 243 lb 3.2 oz (110.3 kg), SpO2 95 %. Wt Readings from Last 3 Encounters:  12/24/21 243 lb 3.2 oz (110.3 kg)  12/17/21 250 lb 8 oz (113.6 kg)  12/17/21 249 lb 8 oz (113.2 kg)   Body mass index is 34.4 kg/m. Performance status: 1 Physical Exam Constitutional:      Appearance: Normal appearance. He is not ill-appearing.  HENT:     Mouth/Throat:     Mouth: Mucous membranes are moist.     Pharynx: Oropharynx is clear. No oropharyngeal exudate or posterior oropharyngeal erythema.  Cardiovascular:     Rate and Rhythm: Regular rhythm. Bradycardia present.     Heart sounds: No murmur heard.   No friction rub. No gallop.  Pulmonary:     Effort: Pulmonary effort is normal. No respiratory distress.     Breath sounds: Normal breath sounds. No wheezing, rhonchi or rales.  Abdominal:     General: Bowel sounds are normal. There is no distension.     Palpations: Abdomen is soft. There is no mass.     Tenderness: There is no abdominal tenderness.  Musculoskeletal:        General: No swelling.     Right lower leg: No edema.     Left lower leg: No edema.  Lymphadenopathy:     Cervical: No cervical  adenopathy.     Upper Body:     Right upper body: No supraclavicular or axillary adenopathy.     Left upper body: No supraclavicular or axillary adenopathy.     Lower Body: No right inguinal adenopathy. No left inguinal adenopathy.  Skin:    General: Skin is warm.     Coloration: Skin is not jaundiced.     Findings: No lesion or rash.  Neurological:     General: No focal deficit present.     Mental Status: He is alert and oriented to person, place, and time. Mental status is at baseline.  Psychiatric:        Mood and Affect: Mood  normal.        Behavior: Behavior normal.        Thought Content: Thought content normal.    ASSESSMENT & PLAN:  Assessment/Plan:  A 76 y.o. 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  His initial biopsy revealed squamous cell carcinoma, but his metastatic temporal lobe lesion showed adenocarcinoma.   Based upon his evaluation today, significant fluid weight has improved from the doubling of Lasix over this past week.  He still has some degree of testicular swelling, for which he knows to continue taking his Lasix as prescribed.  When this improves, I would have no problem decreasing his Lasix back to 20 mg daily.  Per his request, he wishes to hold his capmatinib oral therapy for now.  Although I do not have a problem with him doing this, my concern is his disease may begin to grow over time to where reinitiation of such therapy may be necessary.  This patient is already scheduled to see me in early February 2023, with scans being done at that time for his disease surveillance.  If his scans are the same or better, I will likely reinitiate his capmatinib, but at a lower dose as to prevent additional complications from fluid retention.  The patient understands all the plans discussed today and is in agreement with.     I, Rita Ohara, am acting as scribe for Marice Potter, MD    I have reviewed this report as typed by the medical scribe, and it is complete and accurate.  Sylvester Salonga Macarthur Critchley, MD

## 2021-12-24 ENCOUNTER — Telehealth: Payer: Self-pay | Admitting: Oncology

## 2021-12-24 ENCOUNTER — Inpatient Hospital Stay (INDEPENDENT_AMBULATORY_CARE_PROVIDER_SITE_OTHER): Payer: No Typology Code available for payment source | Admitting: Oncology

## 2021-12-24 ENCOUNTER — Inpatient Hospital Stay: Payer: No Typology Code available for payment source

## 2021-12-24 ENCOUNTER — Other Ambulatory Visit: Payer: Self-pay | Admitting: Hematology and Oncology

## 2021-12-24 ENCOUNTER — Other Ambulatory Visit: Payer: Self-pay

## 2021-12-24 VITALS — BP 136/90 | HR 76 | Temp 98.9°F | Resp 18 | Ht 70.5 in | Wt 243.2 lb

## 2021-12-24 DIAGNOSIS — C3412 Malignant neoplasm of upper lobe, left bronchus or lung: Secondary | ICD-10-CM | POA: Diagnosis not present

## 2021-12-24 DIAGNOSIS — R601 Generalized edema: Secondary | ICD-10-CM

## 2021-12-24 LAB — COMPREHENSIVE METABOLIC PANEL
Albumin: 3.2 — AB (ref 3.5–5.0)
Calcium: 9 (ref 8.7–10.7)

## 2021-12-24 LAB — HEPATIC FUNCTION PANEL
ALT: 89 — AB (ref 10–40)
AST: 93 — AB (ref 14–40)
Alkaline Phosphatase: 54 (ref 25–125)
Bilirubin, Total: 0.4

## 2021-12-24 LAB — BASIC METABOLIC PANEL
BUN: 8 (ref 4–21)
CO2: 27 — AB (ref 13–22)
Chloride: 105 (ref 99–108)
Creatinine: 1 (ref 0.6–1.3)
Glucose: 180
Potassium: 3.9 (ref 3.4–5.3)
Sodium: 138 (ref 137–147)

## 2021-12-24 LAB — CBC
MCV: 88 (ref 80–94)
RBC: 5.35 — AB (ref 3.87–5.11)

## 2021-12-24 LAB — CORRECTED CALCIUM (CC13): Calcium, Corrected: 9.8

## 2021-12-24 LAB — CBC AND DIFFERENTIAL
HCT: 47 (ref 41–53)
Hemoglobin: 15.6 (ref 13.5–17.5)
Neutrophils Absolute: 3.48
Platelets: 178 (ref 150–399)
WBC: 6

## 2021-12-24 LAB — PROTEIN, TOTAL: Total Protein: 5.6 g/dL — AB (ref 6.3–8.2)

## 2021-12-24 NOTE — Telephone Encounter (Signed)
Patient has been scheduled for CT scan appt - aware of instructions.  Pt aware of appt date and times for his follow-up and CT scan per 12/24/21 los, given appt calendar.

## 2021-12-26 ENCOUNTER — Ambulatory Visit
Admission: RE | Admit: 2021-12-26 | Discharge: 2021-12-26 | Disposition: A | Discharge status: Home / Self Care | Payer: PPO | Source: Ambulatory Visit | Attending: Internal Medicine | Admitting: Internal Medicine

## 2021-12-26 ENCOUNTER — Other Ambulatory Visit: Payer: Self-pay

## 2021-12-26 DIAGNOSIS — C7931 Secondary malignant neoplasm of brain: Secondary | ICD-10-CM

## 2021-12-26 MED ORDER — SODIUM CHLORIDE 0.9% FLUSH
10.0000 mL | INTRAVENOUS | Status: DC | PRN
  Administered 2021-12-26: 10 mL via INTRAVENOUS

## 2021-12-26 MED ORDER — HEPARIN SOD (PORK) LOCK FLUSH 100 UNIT/ML IV SOLN
500.0000 [IU] | Freq: Once | INTRAVENOUS | Status: AC
  Administered 2021-12-26: 500 [IU] via INTRAVENOUS

## 2021-12-26 MED ORDER — GADOBENATE DIMEGLUMINE 529 MG/ML IV SOLN
20.0000 mL | Freq: Once | INTRAVENOUS | Status: AC | PRN
  Administered 2021-12-26: 20 mL via INTRAVENOUS

## 2021-12-30 ENCOUNTER — Inpatient Hospital Stay (HOSPITAL_BASED_OUTPATIENT_CLINIC_OR_DEPARTMENT_OTHER): Payer: No Typology Code available for payment source | Admitting: Internal Medicine

## 2021-12-30 ENCOUNTER — Other Ambulatory Visit: Payer: Self-pay

## 2021-12-30 VITALS — BP 164/90 | HR 91 | Temp 97.7°F | Resp 17 | Wt 242.4 lb

## 2021-12-30 DIAGNOSIS — C7931 Secondary malignant neoplasm of brain: Secondary | ICD-10-CM | POA: Diagnosis not present

## 2021-12-30 DIAGNOSIS — C3490 Malignant neoplasm of unspecified part of unspecified bronchus or lung: Secondary | ICD-10-CM | POA: Diagnosis not present

## 2021-12-30 MED ORDER — LEVETIRACETAM 750 MG PO TABS: 750.0000 mg | ORAL_TABLET | Freq: Two times a day (BID) | ORAL | 3 refills | Status: DC

## 2021-12-30 MED ORDER — LEVETIRACETAM 500 MG PO TABS: 500.0000 mg | ORAL_TABLET | ORAL | 3 refills | Status: DC

## 2021-12-30 NOTE — Progress Notes (Signed)
[facility] at [place],  [address] [phone]   Interval Evaluation  Date of Service: 12/30/21 Patient Name: Alex Cordova Patient MRN: [id number] Patient DOB: [birth year] Provider: Ventura Sellers, MD  Identifying Statement:  Alex Cordova is a 75 y.o. male with Brain metastasis [facility]) [C79.31]   Primary Cancer: [place] Lung Stage IV  Oncologic History: 04/18/20: Pre-op SRS R temporal 14 Gy 04/19/20: Craniotomy, resection by Dr. Saintclair Halsted 07/30/20: Salvage SRS R subcortex 09/26/21: Salvage SRS 1.5cm R frontal met  Interval History: Alex Cordova presents today for follow up after recent MRI brain.  No recurrence of language problems or left sided facial droop.  He does continues to experience fatigue, lethargy, worse on some days than others.  Is on a break from Capmatinib because of issues with edema.  Fortunately no further seizures, continues Keppra 1015m twice per day.  Con't to folow closely with Dr. LBobby Rumpfin AStaint Clair  H+P (07/23/20) Patient presents to clinic today for follow up after 3 months post-SRS/craniotomy MRI scan.  He describes no new or progressive neurologic deficits.  No headaches or seizures.  He no longer takes Keppra and he has recently completed tapering off all corticosteroids.  He remains relatively active around the home, independent with gait and activities of daily living.  Currently on observation and serial imaging monitoring with Dr. LBobby Rumpffor lung cancer.   Medications: Current Outpatient Medications on File Prior to Visit  Medication Sig Dispense Refill   acetaminophen (TYLENOL) 325 MG tablet Take 650 mg by mouth every 6 (six) hours as needed.     Alogliptin Benzoate 25 MG TABS TAKE ONE-HALF TABLET BY MOUTH DAILY FOR DIABETES     Calcium Carb-Cholecalciferol 600-10 MG-MCG TABS TAKE 1 TABLET BY MOUTH EVERY MORNING AFTER BREAKFAST     capmatinib (TABRECTA) 200 MG tablet Take 2  tablets (400 mg total) by mouth 2 (two) times daily. 120 tablet 3   cetirizine (ZYRTEC) 10 MG tablet Take 1 tablet by mouth daily.     esomeprazole (NEXIUM) 40 MG capsule Take 40 mg by mouth daily at 12 noon.     furosemide (LASIX) 20 MG tablet Take as needed for swelling (Patient taking differently: 40 mg daily. Take as needed for swelling) 30 tablet 3   Hypertonic Nasal Wash (SINUS RINSE BOTTLE KIT NA) 2 (two) times daily.     ibuprofen (ADVIL) 200 MG tablet Take 200 mg by mouth every 6 (six) hours as needed.     ipratropium (ATROVENT) 0.03 % nasal spray SPRAY 2 SPRAYS INTO EACH NOSTRIL EVERY 8 TO 12 HOURS     levETIRAcetam (KEPPRA) 500 MG tablet Take 500 mg by mouth as directed. Pt to take 1 tab in morning, 1 tab mid day and 2 tabs at bedtime. (Patient not taking: Reported on 12/17/2021)     LORazepam (ATIVAN) 0.5 MG tablet 1 tab po 30 minutes prior to radiation or MRI 10 tablet 0   potassium chloride SA (KLOR-CON M) 20 MEQ tablet TAKE ONE TABLET BY MOUTH DAILY (TAKE WITH FOOD)     pravastatin (PRAVACHOL) 40 MG tablet TAKE ONE TABLET BY MOUTH AT BEDTIME FOR CHOLESTEROL     Psyllium (METAMUCIL PO) Take 1 Scoop by mouth daily.     Current Facility-Administered Medications on File Prior to Visit  Medication Dose Route Frequency Provider Last Rate Last Admin   sodium chloride flush (NS) 0.9 % injection 10 mL  10 mL Intracatheter  PRN Rosanne Sack A, PA-C   10 mL at 09/08/21 0900    Allergies:  Allergies  Allergen Reactions   Metformin Diarrhea   Misc. Sulfonamide Containing Compounds    Other Swelling    Tongue swelling   Rosuvastatin     Other reaction(s): Myositis   Sulfamethoxazole Nausea Only    Childhood allergy   Past Medical History:  Past Medical History:  Diagnosis Date   Arthritis    Diabetes mellitus type 2 in obese (HCC)    Full dentures    GERD (gastroesophageal reflux disease)    Hyperlipidemia    Lung cancer ([place] [facility])    Metastatic cancer to brain ([place])    Pneumonia     Wears glasses    Past Surgical History:  Past Surgical History:  Procedure Laterality Date   APPLICATION OF CRANIAL NAVIGATION N/A 04/19/2020   Procedure: APPLICATION OF CRANIAL NAVIGATION;  Surgeon: Kary Kos, MD;  Location: [facility];  Service: Neurosurgery;  Laterality: N/A;  APPLICATION OF CRANIAL NAVIGATION   COLONOSCOPY W/ BIOPSIES AND POLYPECTOMY     CRANIOTOMY Right 04/19/2020   Procedure: Right Temporal stereotactic craniotomy;  Surgeon: Kary Kos, MD;  Location: [facility];  Service: Neurosurgery;  Laterality: Right;   FRACTURE SURGERY     right shoulder   MULTIPLE TOOTH EXTRACTIONS     Social History:  Social History   Socioeconomic History   Marital status: Married    Spouse name: Not on file   Number of children: Not on file   Years of education: Not on file   Highest education level: Not on file  Occupational History   Not on file  Tobacco Use   Smoking status: Former    Types: Cigarettes   Smokeless tobacco: Never   Tobacco comments:    quit smoking in 1972  Vaping Use   Vaping Use: Never used  Substance and Sexual Activity   Alcohol use: Not Currently    Comment: not since 1972   Drug use: Never   Sexual activity: Not on file  Other Topics Concern   Not on file  Social History Narrative   Not on file   Social Determinants of Health   Financial Resource Strain: Not on file  Food Insecurity: Not on file  Transportation Needs: Not on file  Physical Activity: Not on file  Stress: Not on file  Social Connections: Not on file  Intimate Partner Violence: Not on file   Family History: No family history on file.  Review of Systems: Constitutional: Doesn't report fevers, chills or abnormal weight loss Eyes: Doesn't report blurriness of vision Ears, nose, mouth, throat, and face: Doesn't report sore throat Respiratory: Doesn't report cough, dyspnea or wheezes Cardiovascular: Doesn't report palpitation, chest discomfort  Gastrointestinal:  Doesn't report  nausea, constipation, diarrhea GU: Doesn't report incontinence Skin: Doesn't report skin rashes Neurological: Per HPI Musculoskeletal: Doesn't report joint pain Behavioral/Psych: Doesn't report anxiety  Physical Exam: Vitals:   12/30/21 1040  BP: (!) 164/90  Pulse: 91  Resp: 17  Temp: 97.7 F (36.5 C)  SpO2: 96%   KPS: 90. General: Alert, cooperative, pleasant, in no acute distress Head: Normal EENT: No conjunctival injection or scleral icterus.  Lungs: Resp effort normal Cardiac: Regular rate Abdomen: Non-distended abdomen Skin: No rashes cyanosis or petechiae. Extremities: No clubbing or edema  Neurologic Exam: Mental Status: Awake, alert, attentive to examiner. Oriented to self and environment. Language is fluent with intact comprehension.  Cranial Nerves: Visual acuity is grossly normal.  Visual fields are full. Extra-ocular movements intact. No ptosis. Face symmetric Motor: Tone and bulk are normal. Power is full in both arms and legs. Reflexes are symmetric, no pathologic reflexes present.  Sensory: Intact to light touch Gait: Normal.   Labs: I have reviewed the data as listed    Component Value Date/Time   NA 138 12/24/2021 0000   K 3.9 12/24/2021 0000   CL 105 12/24/2021 0000   CO2 27 (A) 12/24/2021 0000   GLUCOSE 133 (H) 10/08/2020 0919   BUN 8 12/24/2021 0000   CREATININE 1.0 12/24/2021 0000   CREATININE 1.38 (H) 10/08/2020 0919   CALCIUM 9.0 12/24/2021 0000   PROT 5.6 (A) 12/24/2021 0000   ALBUMIN 3.2 (A) 12/24/2021 0000   AST 93 (A) 12/24/2021 0000   AST 23 10/08/2020 0919   ALT 89 (A) 12/24/2021 0000   ALT 26 10/08/2020 0919   ALKPHOS 54 12/24/2021 0000   BILITOT 0.7 10/08/2020 0919   GFRNONAA 53 (L) 10/08/2020 0919   GFRAA >60 07/23/2020 1253   Lab Results  Component Value Date   WBC 6.0 12/24/2021   NEUTROABS 3.48 12/24/2021   HGB 15.6 12/24/2021   HCT 47 12/24/2021   MCV 88 12/24/2021   PLT 178 12/24/2021   Imaging:  [place] Clinician  Interpretation: I have personally reviewed the CNS images as listed.  My interpretation, in the context of the patient's clinical presentation, is likely treatment effect  MR BRAIN W WO CONTRAST  Result Date: 12/26/2021 CLINICAL DATA:  Brain/CNS neoplasm, assess treatment response; metastatic lung cancer follow-up EXAM: MRI HEAD WITHOUT AND WITH CONTRAST TECHNIQUE: Multiplanar, multiecho pulse sequences of the brain and surrounding structures were obtained without and with intravenous contrast. CONTRAST:  55m MULTIHANCE GADOBENATE DIMEGLUMINE 529 MG/ML IV SOLN Contrast was administered via a port which was accessed by a rEquities trader COMPARISON:  09/12/2021 FINDINGS: Brain: Right posterior frontal lesion measures similar in size axially at 1.5 cm but has increased in size craniocaudally at 1.5 cm (previously 1.3 cm). Surrounding edema is similar. Right parietal lesion on series 11, image 129 measures 5 mm (previously 4 mm). Surrounding edema is mildly increased. There is stable enhancement and T2 FLAIR hyperintensity at the right temporal resection site. No new mass or abnormal enhancement. New punctate focus of reduced diffusion involving the left caudate head without enhancement. There is no intracranial hemorrhage. Ventricles and sulci are stable in size and configuration. Stable additional patchy foci of T2 hyperintensity in the supratentorial white matter probably reflecting chronic microvascular ischemic changes. Vascular: Major vessel flow voids at the skull base are preserved. Skull and upper cervical spine: Normal marrow signal is preserved. Sinuses/Orbits: Paranasal sinuses are aerated. Orbits are unremarkable. Other: Sella is unremarkable.  Mastoid air cells are clear. IMPRESSION: Small increase in size of right posterior frontal metastasis with similar edema. Small increase in size of right parietal metastasis with mild increase in surrounding edema. Stable appearance of right temporal resection  site. New punctate left caudate abnormal diffusion signal probably reflects an acute infarct. Electronically Signed   By: PMacy MisM.D.   On: 12/26/2021 12:01    Assessment/Plan Brain metastasis ([facility] [C79.31]  KGertrude Tarbetis clinically stable today.  MRI brain demonstrates modest changes within recently treated R parietal lesion most consistent with post-RT inflammation.   Will continue to defer steroids given relative lack of symptoms, and significant problems with edema, fluid accumulation.  Will con't Keppra 752mBID.  We appreciate the opportunity to participate  in the care of Ladamien Rammel.    We ask that Johnell Bas return to clinic in 3 months following next brain MRI, or sooner as needed.  All questions were answered. The patient knows to call the clinic with any problems, questions or concerns. No barriers to learning were detected.  I have spent a total of 30 minutes of face-to-face and non-face-to-face time, excluding clinical staff time, preparing to see patient, ordering tests and/or medications, counseling the patient, and independently interpreting results and communicating results to the patient/family/caregiver    Ventura Sellers, MD Medical Director of Neuro-Oncology [facility] [place] 12/30/21 10:46 AM

## 2022-01-01 ENCOUNTER — Other Ambulatory Visit: Payer: Self-pay | Admitting: Oncology

## 2022-01-06 ENCOUNTER — Other Ambulatory Visit: Payer: Self-pay | Admitting: Radiation Therapy

## 2022-01-07 NOTE — Progress Notes (Signed)
[facility]  [address] [place],  [place]  [address] [phone]  Clinic Day:  01/13/2022  Referring physician: Landry Mellow, MD  This document serves as a record of services personally performed by Dequincy Macarthur Critchley, MD. It was created on their behalf by [employer] E, a trained medical scribe. The creation of this record is based on the scribe's personal observations and the provider's statements to them.  HISTORY OF PRESENT ILLNESS:  The patient is a 76 y.o. male with metastatic non-small cell lung cancer, which includes brain metastasis and a metastatic focus of disease to his left acromion process.  The patient comes in today to go over his CT scans to ascertain his new disease baseline.  Of note, he has been taking capmatinib, but this medicine has been held over the past few weeks due to it causing significant fluid retention.  Over these past few weeks, his fluid retention has gotten much better.  He is particularly pleased that his testicular swelling has completely dissipated.  Although he is somewhat reticent about restarting capmatinib, he would not have a problem doing it while staying on a daily dose of Lasix for his peripheral edema.  As it pertains to his disease, he denies having any new symptoms or findings which concern him for disease progression.  His wife brings to our attention that he is scheduled for his next brain MRI in April 2023.    With respect to his lung cancer history, his initial lung cancer pathology showed squamous cell carcinoma.  However, pathology from his brain resection in 2022 showed adenocarcinoma. He initially receiving carboplatin/paclitaxel/pembrolizumab for his disease management before being switched to maintenance pembrolizumab.   He stay on this agent for numerous months, which helped with his disease control.  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 He has also undergone stereotactic radiation in [place] to his primary left lung mass.   PHYSICAL EXAM:  Blood pressure (!) 217/101, pulse 62, temperature 98 F (36.7 C), resp. rate 18, height 5' 10.5" (1.791 m), weight 238 lb 4.8 oz (108.1 kg), SpO2 96 %. Wt Readings from Last 3 Encounters:  01/13/22 238 lb 4.8 oz (108.1 kg)  12/30/21 242 lb 6.4 oz (110 kg)  12/24/21 243 lb 3.2 oz (110.3 kg)   Body mass index is 33.71 kg/m. Performance status: 1 Physical Exam Constitutional:      Appearance: Normal appearance. He is not ill-appearing.  HENT:     Mouth/Throat:     Mouth: Mucous membranes are moist.     Pharynx: Oropharynx is clear. No oropharyngeal exudate or posterior oropharyngeal erythema.  Cardiovascular:     Rate and Rhythm: Regular rhythm. Bradycardia present.     Heart sounds: No murmur heard.   No friction rub. No gallop.  Pulmonary:     Effort: Pulmonary effort is normal. No respiratory distress.     Breath sounds: Normal breath sounds. No wheezing, rhonchi or rales.  Abdominal:     General: Bowel sounds are normal. There is no distension.     Palpations: Abdomen is soft. There is no mass.     Tenderness: There is no abdominal tenderness.  Musculoskeletal:        General: No swelling.     Right lower leg: No edema.     Left lower leg: No edema.  Lymphadenopathy:  Cervical: No cervical adenopathy.     Upper Body:     Right upper body: No supraclavicular or axillary adenopathy.     Left upper body: No supraclavicular or axillary adenopathy.     Lower Body: No right inguinal adenopathy. No left inguinal adenopathy.  Skin:    General: Skin is warm.     Coloration: Skin is not jaundiced.     Findings: No lesion or rash.  Neurological:     General: No focal deficit present.     Mental Status: He is alert and oriented to person, place, and time. Mental status is at baseline.  Psychiatric:        Mood  and Affect: Mood normal.        Behavior: Behavior normal.        Thought Content: Thought content normal.   SCANS:  His chest CT revealed the following: FINDINGS:  CT CHEST FINDINGS  Cardiovascular: Right chest port catheter. Aortic atherosclerosis.  Normal heart size. Left coronary artery calcifications. No  pericardial effusion.  Mediastinum/Nodes: No enlarged mediastinal, hilar, or axillary lymph  nodes. Thyroid gland, trachea, and esophagus demonstrate no  significant findings.  Lungs/Pleura: Interval development of very dense, bandlike fibrotic  consolidation and volume loss of the superior segment left lower  lobe and adjacent posterior left upper lobe (series 4, image 48).  Previously seen pulmonary nodules in this vicinity can no longer be  discretely identified. Trace left pleural effusion.  Musculoskeletal: No chest wall mass or suspicious osseous lesions  identified.   CT ABDOMEN PELVIS FINDINGS  Hepatobiliary: No solid liver abnormality is seen. No gallstones,  gallbladder wall thickening, or biliary dilatation.  Pancreas: Unremarkable. No pancreatic ductal dilatation or  surrounding inflammatory changes.  Spleen: Normal in size without significant abnormality.  Adrenals/Urinary Tract: Adrenal glands are unremarkable. Kidneys are  normal, without renal calculi, solid lesion, or hydronephrosis.  Bladder is unremarkable.  Stomach/Bowel: Stomach is within normal limits. Incidental  diverticulum of the descending duodenum. Appendix appears normal. No  evidence of bowel wall thickening, distention, or inflammatory  changes. Pancolonic diverticulosis.  Vascular/Lymphatic: Aortic atherosclerosis. No enlarged abdominal or  pelvic lymph nodes.  Reproductive: Mild prostatomegaly.  Other: No abdominal wall hernia or abnormality. No ascites.  Musculoskeletal: No acute osseous findings.   IMPRESSION:  1. Interval development of very dense, bandlike fibrotic  consolidation  and volume loss of the superior segment left lower  lobe and adjacent posterior left upper lobe. Previously seen  pulmonary nodules in this vicinity can no longer be discretely  identified. Findings are consistent with development of radiation  fibrosis. PET-CT may be used to evaluate for residual or recurrent  metabolically active disease in this vicinity if desired.  2. No evidence of lymphadenopathy or metastatic disease in the  abdomen or pelvis.  3. Diverticulosis without evidence of acute diverticulitis.  4. Coronary artery disease.  Aortic Atherosclerosis (ICD10-I70.0).   ASSESSMENT & PLAN:  Assessment/Plan:  A 76 y.o. 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  His initial biopsy revealed squamous cell carcinoma, but his metastatic temporal lobe lesion showed adenocarcinoma.   In clinic today, I reviewed his CT scan images with him, which he could see that he has no evidence of disease progression.  Understandably, the patient was pleased with these results.  Moving forward, the patient is agreeable to restarting capmatinib at his previous dose.  The plan will be for him to start taking Lasix  20 mg daily to offset any fluid retention this medication may cause.  He also has the autonomy to increase his Lasix to 40 mg daily if his edema becomes more problematic.  He also knows to contact our office over these next few weeks to let us know how he is doing with respect to fluid retention.  Otherwise, as the patient is doing well, I will see him back in 2 months for repeat clinical assessment.  The patient understands all the plans discussed today and is in agreement with them.     I, Rita Ohara, am acting as scribe for Marice Potter, MD    I have reviewed this report as typed by the medical scribe, and it is complete and accurate.  Dequincy Macarthur Critchley, MD

## 2022-01-08 ENCOUNTER — Encounter: Payer: Self-pay | Admitting: Oncology

## 2022-01-13 ENCOUNTER — Inpatient Hospital Stay: Payer: No Typology Code available for payment source | Attending: Internal Medicine | Admitting: Oncology

## 2022-01-13 ENCOUNTER — Telehealth: Payer: Self-pay | Admitting: Oncology

## 2022-01-13 ENCOUNTER — Other Ambulatory Visit: Payer: Self-pay | Admitting: Oncology

## 2022-01-13 VITALS — BP 217/101 | HR 62 | Temp 98.0°F | Resp 18 | Ht 70.5 in | Wt 238.3 lb

## 2022-01-13 DIAGNOSIS — C7931 Secondary malignant neoplasm of brain: Secondary | ICD-10-CM | POA: Diagnosis not present

## 2022-01-13 DIAGNOSIS — C3412 Malignant neoplasm of upper lobe, left bronchus or lung: Secondary | ICD-10-CM

## 2022-01-13 NOTE — Telephone Encounter (Signed)
Per 2/7//23 los next appt scheduled and confirmed with patient

## 2022-01-21 ENCOUNTER — Other Ambulatory Visit: Payer: Self-pay | Admitting: Hematology and Oncology

## 2022-01-21 MED ORDER — TRAMADOL HCL 50 MG PO TABS
50.0000 mg | ORAL_TABLET | Freq: Four times a day (QID) | ORAL | 0 refills | Status: DC | PRN
Start: 2022-01-21 — End: 2022-02-10

## 2022-01-21 NOTE — Progress Notes (Signed)
No nexium, lasix or capmetinib.  I spoke with Lenna Sciara, NP about this and her recommendation was.  Take nexium BID for two to three days to see if epigastric and to take Lasix when he gets home,  Do not take capmetinib tonight or in the am to give [place] a chance to talk to Dr. Bobby Rumpf.  Lenna Sciara, NP also sent prescription in for Ultram for his pain.  Pt instructed to call tomorrow around 11:00 after time permits conversation with DR .Lewis about this encounter.

## 2022-01-22 ENCOUNTER — Telehealth: Payer: Self-pay

## 2022-01-22 NOTE — Telephone Encounter (Signed)
Per Dr. Bobby Rumpf.  Patient can decrease dose of Capmatinib from 200mg  BID to 200mg  daily.  He is also changing his appointment on March 13, 2022 to February 10, 2022 to assess how he is doing on that reduced dose.    Pt did tell me that his epigastric pain was gone this morning, but he did wake with a headache which he took 2 tylenol and then took a Tramadol, headache is improving.

## 2022-02-04 ENCOUNTER — Telehealth: Payer: Self-pay

## 2022-02-04 NOTE — Telephone Encounter (Signed)
Pt called attempting to leave msg and I returned call.  He is having some seizure activity this morning.  He tried to call [facility] office and could not get through the automated messages.  I called [place] office per Dr. Bobby Rumpf and LM explaining what was going on and for them to reach out to patient. ?

## 2022-02-04 NOTE — Progress Notes (Signed)
?[facility]  ?[address] ?[place],  [place]  [address] ?[phone] ? ?Clinic Day:  02/10/2022 ? ?Referring physician: Jones, Christopher, MD ? ?This document serves as a record of services personally performed by Dequincy A Lewis, MD. It was created on their behalf by Curry,Lauren E, a trained medical scribe. The creation of this record is based on the scribe's personal observations and the provider's statements to them. ? ?HISTORY OF PRESENT ILLNESS:  ?The patient is a 76 y.o. male with metastatic non-small cell lung cancer, which includes brain metastasis and a metastatic focus of disease to his left acromion process.  The patient comes in today for routine follow up.  Of note, he has been taking capmatinib 400 mg BID and Lasix 20 mg daily to offset any fluid retention this medication may cause.  He briefly discontinued his capmatinib due to the skin dryness and fatigue it was causing.  However, he got back on the medication approximately 2 weeks ago.  He claims to be feeling fine now.  On another note, he did have 2 seizures over the past 3 weeks; however, they occurred while he was not taking his Keppra.  Since being back on this agent, no further seizures have occurred.  His wife brings to our attention that he is scheduled for his next brain MRI in April 2023.   ? ?With respect to his lung cancer history, his initial lung cancer pathology showed squamous cell carcinoma.  However, pathology from his brain resection in 2022 showed adenocarcinoma. He initially receiving carboplatin/paclitaxel/pembrolizumab for his disease management before being switched to maintenance pembrolizumab.   He stay on this agent for numerous months, which helped with his disease control.  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   He has also undergone stereotactic radiation in [place]  to his primary left lung mass.  ? ?PHYSICAL EXAM:  ?Blood pressure (!) 180/102, pulse 62, temperature 98.4 ?F (36.9 ?C), resp. rate 16, height 5' 10.5" (1.791 m), weight 238 lb 8 oz (108.2 kg), SpO2 94 %. ?Wt Readings from Last 3 Encounters:  ?02/10/22 238 lb 8 oz (108.2 kg)  ?01/13/22 238 lb 4.8 oz (108.1 kg)  ?12/30/21 242 lb 6.4 oz (110 kg)  ? ?Body mass index is 33.74 kg/m?. ?Performance status: 1 ?Physical Exam ?Constitutional:   ?   Appearance: Normal appearance. He is not ill-appearing.  ?HENT:  ?   Mouth/Throat:  ?   Mouth: Mucous membranes are moist.  ?   Pharynx: Oropharynx is clear. No oropharyngeal exudate or posterior oropharyngeal erythema.  ?Cardiovascular:  ?   Rate and Rhythm: Normal rate and regular rhythm.  ?   Heart sounds: No murmur heard. ?  No friction rub. No gallop.  ?Pulmonary:  ?   Effort: Pulmonary effort is normal. No respiratory distress.  ?   Breath sounds: Normal breath sounds. No wheezing, rhonchi or rales.  ?Abdominal:  ?   General: Bowel sounds are normal. There is no distension.  ?   Palpations: Abdomen is soft. There is no mass.  ?   Tenderness: There is no abdominal tenderness.  ?Musculoskeletal:     ?   General: No swelling.  ?   Right lower leg: No edema.  ?   Left lower leg: No edema.  ?Lymphadenopathy:  ?   Cervical: No cervical adenopathy.  ?   Upper Body:  ?     Right upper body: No supraclavicular or axillary adenopathy.  ?   Left upper body: No supraclavicular or axillary adenopathy.  ?   Lower Body: No right inguinal adenopathy. No left inguinal adenopathy.  ?Skin: ?   General: Skin is warm.  ?   Coloration: Skin is not jaundiced.  ?   Findings: No lesion or rash.  ?Neurological:  ?   General: No focal deficit present.  ?   Mental Status: He is alert and oriented to person, place, and time. Mental status is at baseline.  ?Psychiatric:     ?   Mood and Affect: Mood normal.     ?   Behavior: Behavior normal.     ?   Thought Content: Thought content normal.  ? ?ASSESSMENT &  PLAN:  ?Assessment/Plan:  A 76 y.o. 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  His initial biopsy revealed squamous cell carcinoma, but his metastatic temporal lobe lesion showed adenocarcinoma. He continues to take his capmatinib twice daily with Lasix 20 mg daily to offset any fluid retention this medication may cause.  He knows it remains imperative for him to stay on Keppra indefinitely.  If he has a seizure while on Keppra, it would facilitate me moving his brain MRI sooner.  Clinically, the patient is doing well. I will see him back in 2 months for repeat clinical assessment.  CT scans of his chest/abdomen/pelvis will be done a day before his next visit to ascertain his new disease baseline.  He also has a brain MRI scheduled before his next visit to ensure there is no CNS disease progression.  The patient understands all the plans discussed today and is in agreement with them.   ? ? ?I, Lauren Curry, am acting as scribe for Dequincy A Lewis, MD   ? ?I have reviewed this report as typed by the medical scribe, and it is complete and accurate. ? ?Dequincy A Lewis, MD ? ? ? ?  ?

## 2022-02-05 ENCOUNTER — Encounter: Payer: Self-pay | Admitting: Hematology and Oncology

## 2022-02-10 ENCOUNTER — Other Ambulatory Visit: Payer: Self-pay

## 2022-02-10 ENCOUNTER — Telehealth: Payer: Self-pay | Admitting: Oncology

## 2022-02-10 ENCOUNTER — Inpatient Hospital Stay: Payer: No Typology Code available for payment source | Attending: Internal Medicine | Admitting: Oncology

## 2022-02-10 ENCOUNTER — Encounter: Payer: Self-pay | Admitting: Oncology

## 2022-02-10 ENCOUNTER — Other Ambulatory Visit: Payer: Self-pay | Admitting: Oncology

## 2022-02-10 ENCOUNTER — Inpatient Hospital Stay: Payer: No Typology Code available for payment source

## 2022-02-10 VITALS — BP 180/102 | HR 62 | Temp 98.4°F | Resp 16 | Ht 70.5 in | Wt 238.5 lb

## 2022-02-10 DIAGNOSIS — C3412 Malignant neoplasm of upper lobe, left bronchus or lung: Secondary | ICD-10-CM

## 2022-02-10 DIAGNOSIS — C7931 Secondary malignant neoplasm of brain: Secondary | ICD-10-CM | POA: Diagnosis not present

## 2022-02-10 LAB — CBC: RBC: 5.13 — AB (ref 3.87–5.11)

## 2022-02-10 LAB — HEPATIC FUNCTION PANEL
ALT: 22 (ref 10–40)
AST: 30 (ref 14–40)
Alkaline Phosphatase: 54 (ref 25–125)
Bilirubin, Total: 0.7

## 2022-02-10 LAB — BASIC METABOLIC PANEL
BUN: 13 (ref 4–21)
CO2: 26 — AB (ref 13–22)
Chloride: 104 (ref 99–108)
Creatinine: 1.1 (ref 0.6–1.3)
Glucose: 126
Potassium: 3.8 (ref 3.4–5.3)
Sodium: 136 — AB (ref 137–147)

## 2022-02-10 LAB — COMPREHENSIVE METABOLIC PANEL
Albumin: 3.2 — AB (ref 3.5–5.0)
Calcium: 9.1 (ref 8.7–10.7)

## 2022-02-10 LAB — CBC AND DIFFERENTIAL
HCT: 45 (ref 41–53)
Hemoglobin: 14.9 (ref 13.5–17.5)
Neutrophils Absolute: 4.22
Platelets: 167 (ref 150–399)
WBC: 6.7

## 2022-02-10 MED ORDER — TRAMADOL HCL 50 MG PO TABS: 50.0000 mg | ORAL_TABLET | Freq: Four times a day (QID) | ORAL | 0 refills | Status: DC | PRN

## 2022-02-10 MED ORDER — LORAZEPAM 0.5 MG PO TABS: ORAL_TABLET | ORAL | 1 refills | Status: DC

## 2022-02-10 MED ORDER — FUROSEMIDE 20 MG PO TABS: ORAL_TABLET | ORAL | 3 refills | Status: DC

## 2022-02-10 NOTE — Telephone Encounter (Signed)
Per 3/7 los next appt scheduled and confirmed with patient ?

## 2022-02-20 ENCOUNTER — Other Ambulatory Visit: Payer: Self-pay | Admitting: Internal Medicine

## 2022-03-11 ENCOUNTER — Telehealth: Payer: Self-pay | Admitting: Radiation Therapy

## 2022-03-11 ENCOUNTER — Telehealth: Payer: Self-pay

## 2022-03-11 NOTE — Telephone Encounter (Signed)
T/C from pt's wife, Ameryles, stating Mr Calico is losing use of his left side and hand again.  She was told to call the office if this happened.  She would like a return call from the Dr. [phone] ?

## 2022-03-11 NOTE — Telephone Encounter (Addendum)
Received a call from Mrs. Marcello Moores about [employer]. He is experiencing Lt sided weakness and unable to grip or hold things with his Lt hand. His wife is requesting recommendations. Currently the patient is not on any steroids, only Keppra. He does have some decadron from past tx. (13 x 2 mg pills and 36 x 4 mg pills)  ? ?Sent message to Dr. Mickeal Skinner and Shona Simpson.  ? ?Update:  ?Called Mrs. Hoppe back after instructed by Dr. Mickeal Skinner for pt to begin a short course of steroids. (Dex 4mg  daily x5 days, then 2mg  daily thereafter) ? ?The scheduled brain MRI has been moved up to 4/13 and Dr. Mickeal Skinner will see him on 4/17 to review those results and manage steroid taper.  ? ? ?Mont Dutton R.T.(R)(T) ?Radiation Special Procedures Navigator  ?

## 2022-03-13 ENCOUNTER — Ambulatory Visit: Payer: No Typology Code available for payment source | Admitting: Oncology

## 2022-03-13 ENCOUNTER — Other Ambulatory Visit: Payer: No Typology Code available for payment source

## 2022-03-16 DIAGNOSIS — E1165 Type 2 diabetes mellitus with hyperglycemia: Secondary | ICD-10-CM | POA: Diagnosis not present

## 2022-03-16 DIAGNOSIS — I509 Heart failure, unspecified: Secondary | ICD-10-CM | POA: Diagnosis not present

## 2022-03-16 DIAGNOSIS — E1122 Type 2 diabetes mellitus with diabetic chronic kidney disease: Secondary | ICD-10-CM | POA: Diagnosis not present

## 2022-03-16 DIAGNOSIS — C3490 Malignant neoplasm of unspecified part of unspecified bronchus or lung: Secondary | ICD-10-CM | POA: Diagnosis not present

## 2022-03-16 DIAGNOSIS — D8481 Immunodeficiency due to conditions classified elsewhere: Secondary | ICD-10-CM | POA: Diagnosis not present

## 2022-03-16 DIAGNOSIS — C7931 Secondary malignant neoplasm of brain: Secondary | ICD-10-CM | POA: Diagnosis not present

## 2022-03-16 DIAGNOSIS — G4089 Other seizures: Secondary | ICD-10-CM | POA: Diagnosis not present

## 2022-03-16 DIAGNOSIS — I110 Hypertensive heart disease with heart failure: Secondary | ICD-10-CM | POA: Diagnosis not present

## 2022-03-16 DIAGNOSIS — E261 Secondary hyperaldosteronism: Secondary | ICD-10-CM | POA: Diagnosis not present

## 2022-03-16 DIAGNOSIS — E1169 Type 2 diabetes mellitus with other specified complication: Secondary | ICD-10-CM | POA: Diagnosis not present

## 2022-03-16 DIAGNOSIS — C7951 Secondary malignant neoplasm of bone: Secondary | ICD-10-CM | POA: Diagnosis not present

## 2022-03-16 DIAGNOSIS — G63 Polyneuropathy in diseases classified elsewhere: Secondary | ICD-10-CM | POA: Diagnosis not present

## 2022-03-18 ENCOUNTER — Other Ambulatory Visit: Payer: Self-pay | Admitting: Internal Medicine

## 2022-03-18 DIAGNOSIS — C3412 Malignant neoplasm of upper lobe, left bronchus or lung: Secondary | ICD-10-CM

## 2022-03-19 ENCOUNTER — Ambulatory Visit
Admission: RE | Admit: 2022-03-19 | Discharge: 2022-03-19 | Disposition: A | Discharge status: Home / Self Care | Payer: No Typology Code available for payment source | Source: Ambulatory Visit | Attending: Internal Medicine | Admitting: Internal Medicine

## 2022-03-19 DIAGNOSIS — I639 Cerebral infarction, unspecified: Secondary | ICD-10-CM | POA: Diagnosis not present

## 2022-03-19 DIAGNOSIS — G936 Cerebral edema: Secondary | ICD-10-CM | POA: Diagnosis not present

## 2022-03-19 DIAGNOSIS — C7931 Secondary malignant neoplasm of brain: Secondary | ICD-10-CM

## 2022-03-19 IMAGING — MR MR HEAD WO/W CM
15 series · 48 of 48 positions shown · IV contrast (20  ML MULTIANCE)
Comparison: [DATE]

CLINICAL DATA: Metastatic lung cancer follow-up

EXAM:
MRI HEAD WITHOUT AND WITH CONTRAST
TECHNIQUE: Multiplanar, multiecho pulse sequences of the brain and surrounding
structures were obtained without and with intravenous contrast.
CONTRAST:  20mL MULTIHANCE GADOBENATE DIMEGLUMINE 529 MG/ML IV SOLN

[Series 5: T1 · sagittal · 4.0mm · 0.75mm/px · 1 of 31 slices shown (1 of 3)]
[im 1/31]
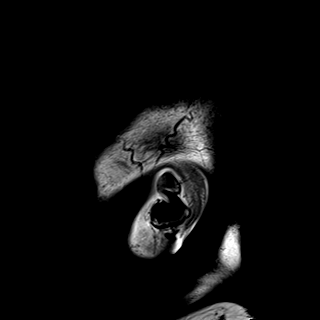

[Series 6: DWI · axial · 3.0mm · 0.94mm/px · z∈[-58,+90]mm · 8 of 172 slices shown (1 of 3)]
[im 1/172]
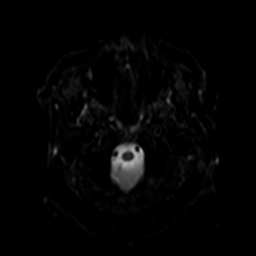
[im 25/172]
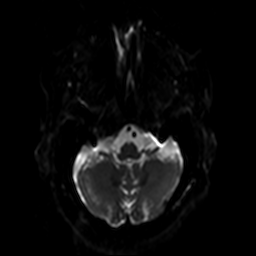
[im 49/172]
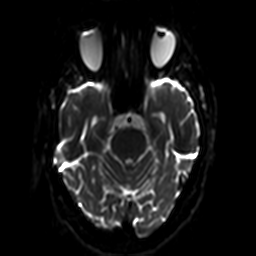
[im 74/172]
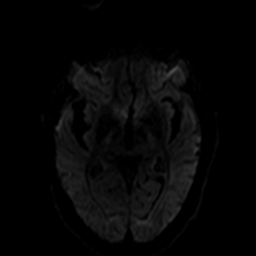
[im 98/172]
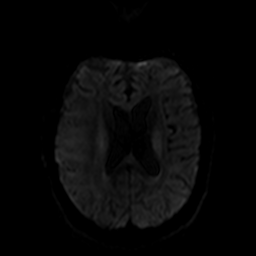
[im 123/172]
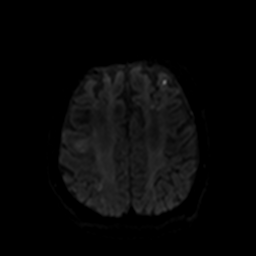
[im 147/172]
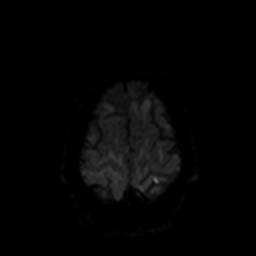
[im 172/172]
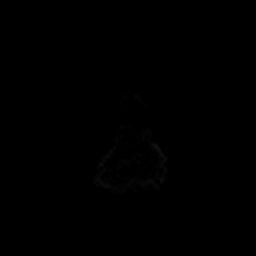

[Series 7: ax dwi_tracew · axial · 3.0mm · 0.94mm/px · z∈[-58,+90]mm · 4 of 86 slices shown]
[im 1/86]
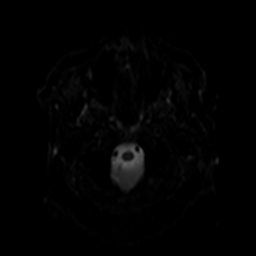
[im 29/86]
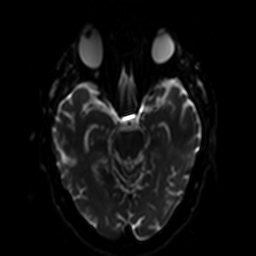
[im 57/86]
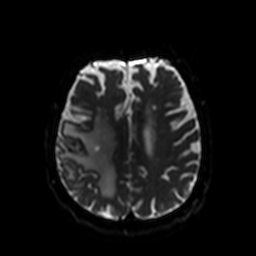
[im 86/86]
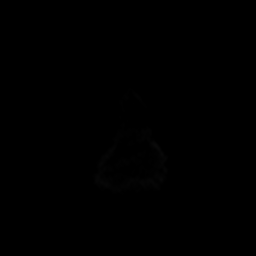

[Series 8: ax dwi_adc · axial · 3.0mm · 0.94mm/px · z∈[-58,+90]mm · 2 of 43 slices shown]
[im 1/43]
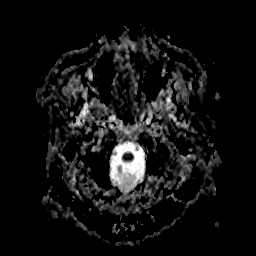
[im 43/43]
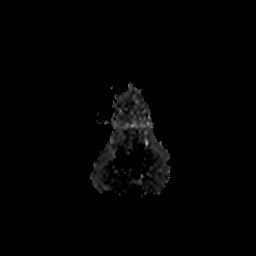

[Series 9: DWI · coronal · 5.0mm · 1.44mm/px · 3 of 68 slices shown (2 of 3)]
[im 1/68]
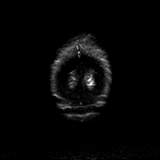
[im 34/68]
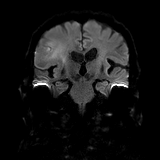
[im 68/68]
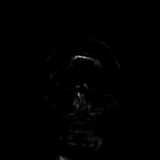

[Series 10: DWI · coronal · 5.0mm · 1.44mm/px · 1 of 34 slices shown (3 of 3)]
[im 1/34]
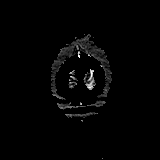

[Series 11: T2 · axial · 4.0mm · 0.36mm/px · 1 of 31 slices shown]
[im 1/31]
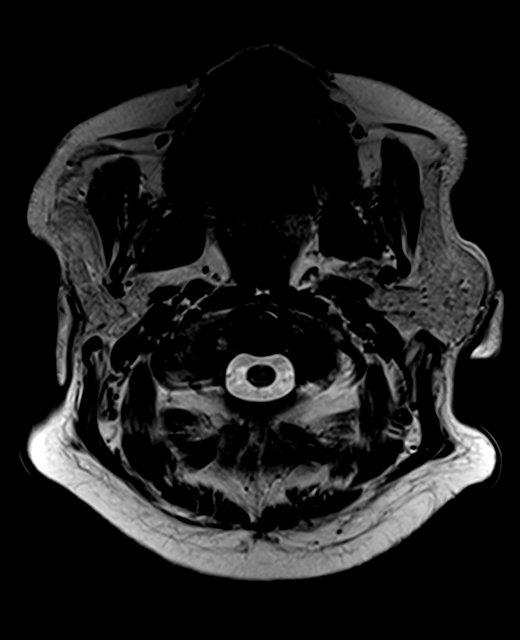

[Series 12: FLAIR · axial · 3.0mm · 0.72mm/px · 1 of 26 slices shown]
[im 1/26]
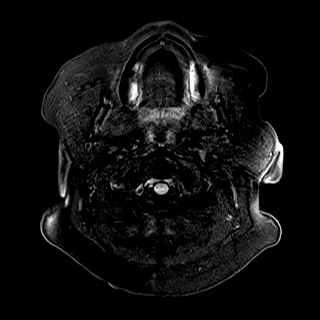

[Series 13: mip_images(sw) · axial · 12.0mm · 0.90mm/px · z∈[-61,+69]mm · 4 of 89 slices shown]
[im 1/89]
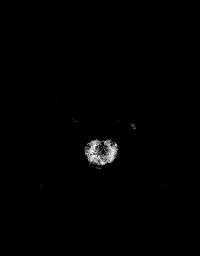
[im 30/89]
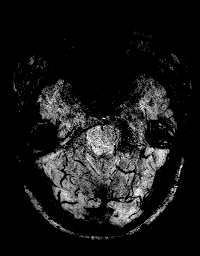
[im 59/89]
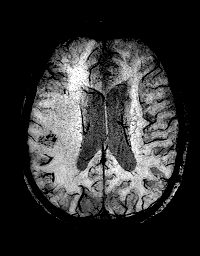
[im 89/89]
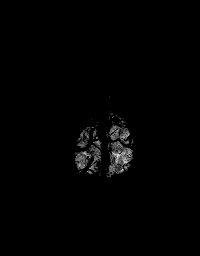

[Series 14: swi_images · axial · 1.5mm · 0.90mm/px · z∈[-66,+74]mm · 4 of 96 slices shown]
[im 1/96]
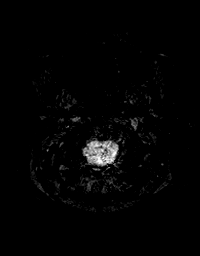
[im 32/96]
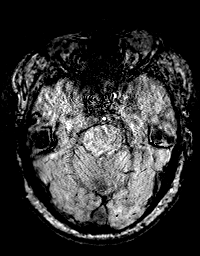
[im 64/96]
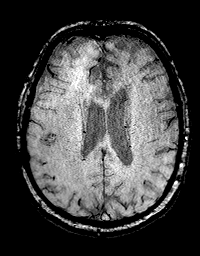
[im 96/96]
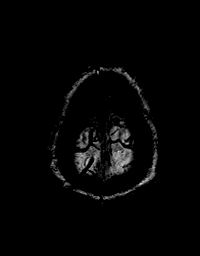

[Series 15: T1 · axial · 1.0mm · 0.94mm/px · z∈[-82,+76]mm · 7 of 160 slices shown (2 of 3)]
[im 1/160]
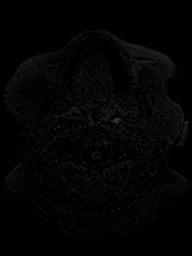
[im 27/160]
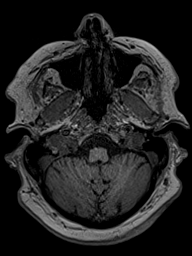
[im 54/160]
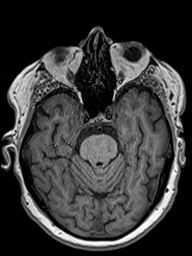
[im 80/160]
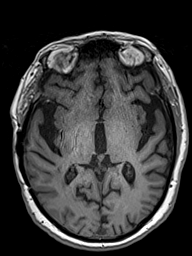
[im 107/160]
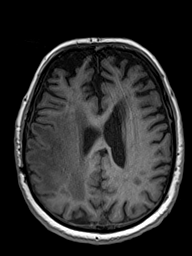
[im 133/160]
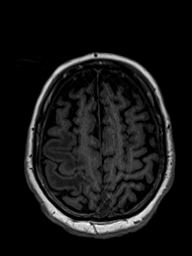
[im 160/160]
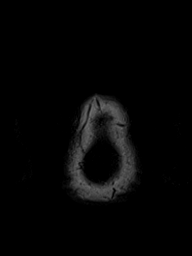

[Series 16: T2 post-contrast · coronal · 4.5mm · 0.36mm/px · 2 of 35 slices shown]
[im 1/35]
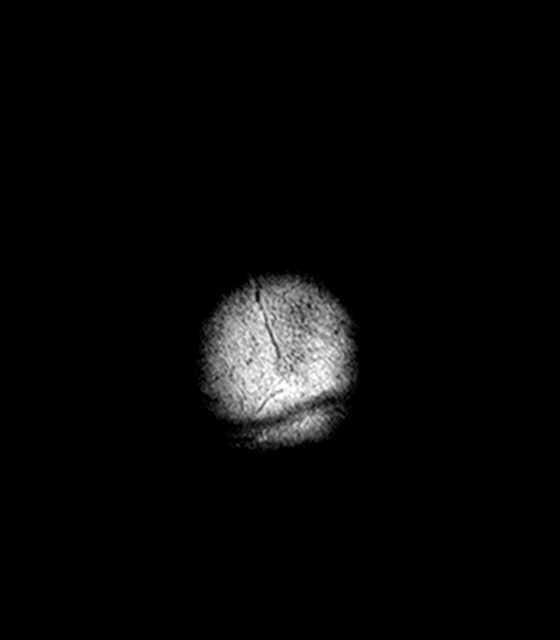
[im 35/35]
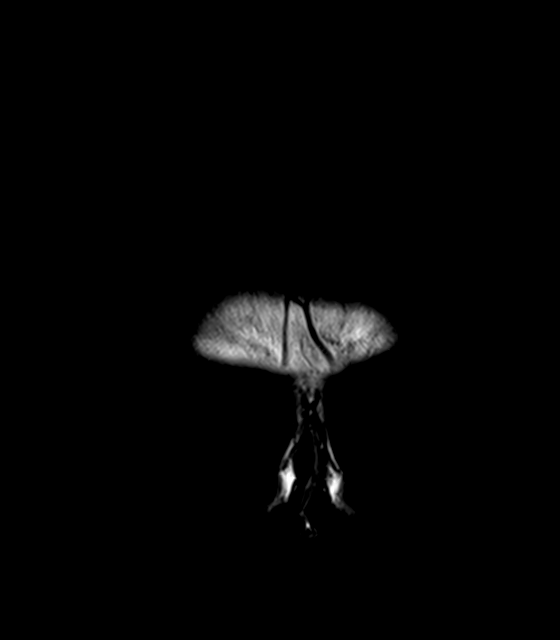

[Series 17: T1 · axial · 1.0mm · 0.94mm/px · z∈[-82,+76]mm · 7 of 160 slices shown (3 of 3)]
[im 1/160]
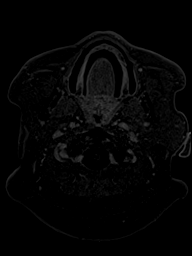
[im 27/160]
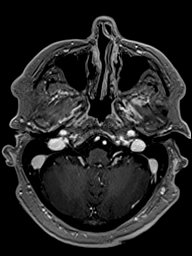
[im 54/160]
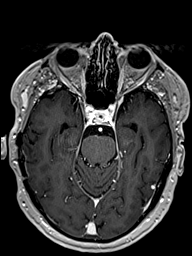
[im 80/160]
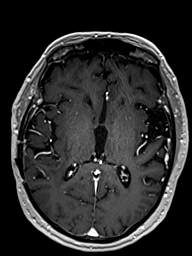
[im 107/160]
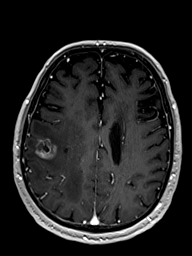
[im 133/160]
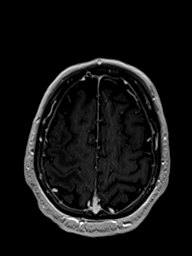
[im 160/160]
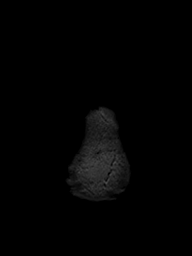

[Series 18: T1 post-contrast · coronal · 4.5mm · 0.72mm/px · 2 of 35 slices shown (1 of 2)]
[im 1/35]
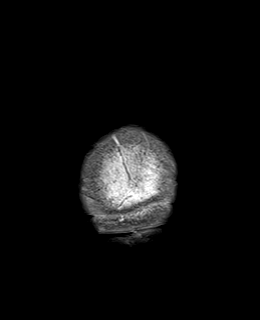
[im 35/35]
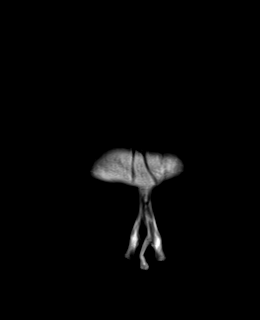

[Series 19: T1 post-contrast · sagittal · 4.0mm · 0.75mm/px · 1 of 31 slices shown (2 of 2)]
[im 1/31]
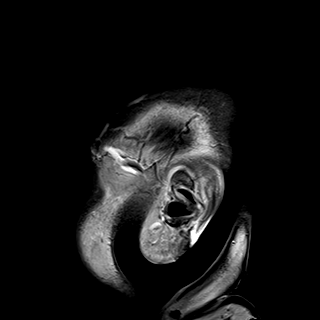

[48 of 48 positions shown; findings below may reference images not displayed]

FINDINGS: Brain: Increase in size of right posterior frontal lesion. For
example, measuring 1.9 x 2.3 cm on series 17, image 104 (previously
up to 1.5 cm). Increased associated susceptibility likely reflecting
chronic blood products.

Increase in size of right parietal lesion measuring 6 mm on image
113 (previously 5 mm).

Stable enhancement at the right temporal resection site.

No new mass or abnormal enhancement.

Increased right frontoparietal edema.  No significant mass effect.

Few small foci of reduced diffusion primarily in the left
frontoparietal lobes. No associated enhancement.

No hydrocephalus or extra-axial collection. Stable additional patchy
foci of T2 hyperintensity in the supratentorial white matter that
may reflect chronic microvascular ischemic changes.

Vascular: Major vessel flow voids at the skull base are preserved.

Skull and upper cervical spine: Normal marrow signal is preserved.

Sinuses/Orbits: Paranasal sinuses are aerated. Orbits are
unremarkable.

Other: Sella is unremarkable.  Mastoid air cells are clear.
IMPRESSION: Increased size of right frontal and parietal lesions with greater
surrounding edema.

No new mass.

Few small left cerebral acute infarcts primarily in the
frontoparietal lobes.

These results will be called to the ordering clinician or
representative by the Radiologist Assistant, and communication
documented in the PACS or [REDACTED].

## 2022-03-19 MED ORDER — GADOBENATE DIMEGLUMINE 529 MG/ML IV SOLN
20.0000 mL | Freq: Once | INTRAVENOUS | Status: AC | PRN
  Administered 2022-03-19: 20 mL via INTRAVENOUS

## 2022-03-23 ENCOUNTER — Inpatient Hospital Stay (HOSPITAL_BASED_OUTPATIENT_CLINIC_OR_DEPARTMENT_OTHER): Payer: No Typology Code available for payment source | Admitting: Internal Medicine

## 2022-03-23 ENCOUNTER — Inpatient Hospital Stay: Payer: No Typology Code available for payment source | Attending: Internal Medicine

## 2022-03-23 ENCOUNTER — Other Ambulatory Visit: Payer: Self-pay

## 2022-03-23 VITALS — BP 131/75 | HR 65 | Temp 98.4°F | Resp 18 | Wt 242.3 lb

## 2022-03-23 DIAGNOSIS — E119 Type 2 diabetes mellitus without complications: Secondary | ICD-10-CM | POA: Diagnosis not present

## 2022-03-23 DIAGNOSIS — Z87891 Personal history of nicotine dependence: Secondary | ICD-10-CM | POA: Insufficient documentation

## 2022-03-23 DIAGNOSIS — C7931 Secondary malignant neoplasm of brain: Secondary | ICD-10-CM

## 2022-03-23 DIAGNOSIS — Z79899 Other long term (current) drug therapy: Secondary | ICD-10-CM | POA: Insufficient documentation

## 2022-03-23 DIAGNOSIS — C3490 Malignant neoplasm of unspecified part of unspecified bronchus or lung: Secondary | ICD-10-CM | POA: Diagnosis present

## 2022-03-23 MED ORDER — DEXAMETHASONE 2 MG PO TABS: 2.0000 mg | ORAL_TABLET | Freq: Every day | ORAL | 1 refills | Status: DC

## 2022-03-23 NOTE — Progress Notes (Signed)
? ?[place] [facility] [address]  ?[place], [place] [address] ?[phone] ? ? ?Interval Evaluation ? ?Date of Service: 03/23/22 ?Patient Name: Alex Cordova ?Patient MRN: [id number] ?Patient DOB: 1946-10-24 ?Provider: Ventura Sellers, MD ? ?Identifying Statement:  ?Alex Cordova is a 76 y.o. male with Malignant neoplasm metastatic to brain [facility]) [C79.31] ? ? ?Primary Cancer: [place] Lung Stage IV ? ?Oncologic History: ?04/18/20: Pre-op SRS R temporal 14 Gy ?04/19/20: Craniotomy, resection by Dr. Saintclair Halsted ?07/30/20: Salvage SRS R subcortex ?09/26/21: Salvage SRS 1.5cm R frontal met ? ?Interval History: ?Alex Cordova presents today for follow up after recent MRI brain.  He did experience several seizures this past month, though he had not been dosing his Keppra.  Left sided weakness is improved back to baseline since dosing the decadron starting 4/5, currently on 73m daily.  He does continues to experience fatigue, lethargy, worse on some days than others.  Back on Capmatinib now with Dr. LBobby Rumpfin ABrisbin ? ?H+P (07/23/20) Patient presents to clinic today for follow up after 3 months post-SRS/craniotomy MRI scan.  He describes no new or progressive neurologic deficits.  No headaches or seizures.  He no longer takes Keppra and he has recently completed tapering off all corticosteroids.  He remains relatively active around the home, independent with gait and activities of daily living.  Currently on observation and serial imaging monitoring with Dr. LBobby Rumpffor lung cancer.  ? ?Medications: ?Current Outpatient Medications on File Prior to Visit  ?Medication Sig Dispense Refill  ? acetaminophen (TYLENOL) 325 MG tablet Take 650 mg by mouth every 6 (six) hours as needed.    ? Alogliptin Benzoate 25 MG TABS TAKE ONE-HALF TABLET BY MOUTH DAILY FOR DIABETES    ? Calcium Carb-Cholecalciferol 600-10 MG-MCG TABS TAKE 1 TABLET BY MOUTH EVERY MORNING AFTER BREAKFAST    ? capmatinib  (TABRECTA) 200 MG tablet Take 2 tablets (400 mg total) by mouth 2 (two) times daily. (Patient not taking: Reported on 12/30/2021) 120 tablet 3  ? cetirizine (ZYRTEC) 10 MG tablet Take 1 tablet by mouth daily.    ? esomeprazole (NEXIUM) 40 MG capsule Take 40 mg by mouth daily at 12 noon.    ? furosemide (LASIX) 20 MG tablet Take as needed for swelling 30 tablet 3  ? Hypertonic Nasal Wash (SINUS RINSE BOTTLE KIT NA) 2 (two) times daily.    ? ibuprofen (ADVIL) 200 MG tablet Take 200 mg by mouth every 6 (six) hours as needed.    ? ipratropium (ATROVENT) 0.03 % nasal spray SPRAY 2 SPRAYS INTO EACH NOSTRIL EVERY 8 TO 12 HOURS    ? levETIRAcetam (KEPPRA) 750 MG tablet TAKE 1 TABLET BY MOUTH IN MORNING, 1 TAB MID DAY AND 2 TABS AT BEDTIME. 120 tablet 1  ? LORazepam (ATIVAN) 0.5 MG tablet One tab po q 6-8 hrs prn anxiety/seizures 30 tablet 1  ? potassium chloride SA (KLOR-CON M) 20 MEQ tablet TAKE ONE TABLET BY MOUTH DAILY (TAKE WITH FOOD)    ? pravastatin (PRAVACHOL) 40 MG tablet TAKE ONE TABLET BY MOUTH AT BEDTIME FOR CHOLESTEROL    ? Psyllium (METAMUCIL PO) Take 1 Scoop by mouth daily.    ? traMADol (ULTRAM) 50 MG tablet Take 1 tablet (50 mg total) by mouth every 6 (six) hours as needed. 30 tablet 0  ? ?Current Facility-Administered Medications on File Prior to Visit  ?Medication Dose Route Frequency Provider Last Rate Last Admin  ? sodium chloride flush (  NS) 0.9 % injection 10 mL  10 mL Intracatheter PRN Mosher, Kelli A, PA-C   10 mL at 09/08/21 0900  ? ? ?Allergies:  ?Allergies  ?Allergen Reactions  ? Metformin Diarrhea  ? Misc. Sulfonamide Containing Compounds   ? Other Swelling  ?  Tongue swelling  ? Rosuvastatin   ?  Other reaction(s): Myositis  ? Sulfamethoxazole Nausea Only  ?  Childhood allergy  ? ?Past Medical History:  ?Past Medical History:  ?Diagnosis Date  ? Arthritis   ? Diabetes mellitus type 2 in obese [facility])   ? Full dentures   ? GERD (gastroesophageal reflux disease)   ? Hyperlipidemia   ? Lung cancer  ([place])   ? Metastatic cancer to brain [facility])   ? Pneumonia   ? Wears glasses   ? ?Past Surgical History:  ?Past Surgical History:  ?Procedure Laterality Date  ? APPLICATION OF CRANIAL NAVIGATION N/A 04/19/2020  ? Procedure: APPLICATION OF CRANIAL NAVIGATION;  Surgeon: Kary Kos, MD;  Location: [facility];  Service: Neurosurgery;  Laterality: N/A;  APPLICATION OF CRANIAL NAVIGATION  ? COLONOSCOPY W/ BIOPSIES AND POLYPECTOMY    ? CRANIOTOMY Right 04/19/2020  ? Procedure: Right Temporal stereotactic craniotomy;  Surgeon: Kary Kos, MD;  Location: [facility];  Service: Neurosurgery;  Laterality: Right;  ? FRACTURE SURGERY    ? right shoulder  ? MULTIPLE TOOTH EXTRACTIONS    ? ?Social History:  ?Social History  ? ?Socioeconomic History  ? Marital status: Married  ?  Spouse name: Not on file  ? Number of children: Not on file  ? Years of education: Not on file  ? Highest education level: Not on file  ?Occupational History  ? Not on file  ?Tobacco Use  ? Smoking status: Former  ?  Types: Cigarettes  ? Smokeless tobacco: Never  ? Tobacco comments:  ?  quit smoking in 1972  ?Vaping Use  ? Vaping Use: Never used  ?Substance and Sexual Activity  ? Alcohol use: Not Currently  ?  Comment: not since 1972  ? Drug use: Never  ? Sexual activity: Not on file  ?Other Topics Concern  ? Not on file  ?Social History Narrative  ? Not on file  ? ?Social Determinants of Health  ? ?Financial Resource Strain: Not on file  ?Food Insecurity: Not on file  ?Transportation Needs: Not on file  ?Physical Activity: Not on file  ?Stress: Not on file  ?Social Connections: Not on file  ?Intimate Partner Violence: Not on file  ? ?Family History: No family history on file. ? ?Review of Systems: ?Constitutional: Doesn't report fevers, chills or abnormal weight loss ?Eyes: Doesn't report blurriness of vision ?Ears, nose, mouth, throat, and face: Doesn't report sore throat ?Respiratory: Doesn't report cough, dyspnea or wheezes ?Cardiovascular: Doesn't report  palpitation, chest discomfort  ?Gastrointestinal:  Doesn't report nausea, constipation, diarrhea ?GU: Doesn't report incontinence ?Skin: Doesn't report skin rashes ?Neurological: Per HPI ?Musculoskeletal: Doesn't report joint pain ?Behavioral/Psych: Doesn't report anxiety ? ?Physical Exam: ?Vitals:  ? 03/23/22 0851  ?BP: 131/75  ?Pulse: 65  ?Resp: 18  ?Temp: 98.4 ?F (36.9 ?C)  ?SpO2: 97%  ? ? ?KPS: 90. ?General: Alert, cooperative, pleasant, in no acute distress ?Head: Normal ?EENT: No conjunctival injection or scleral icterus.  ?Lungs: Resp effort normal ?Cardiac: Regular rate ?Abdomen: Non-distended abdomen ?Skin: No rashes cyanosis or petechiae. ?Extremities: No clubbing or edema ? ?Neurologic Exam: ?Mental Status: Awake, alert, attentive to examiner. Oriented to self and environment. Language is fluent with  intact comprehension.  ?Cranial Nerves: Visual acuity is grossly normal. Visual fields are full. Extra-ocular movements intact. No ptosis. Face symmetric ?Motor: Tone and bulk are normal. Power is full in both arms and legs. Reflexes are symmetric, no pathologic reflexes present.  ?Sensory: Intact to light touch ?Gait: Normal. ? ? ?Labs: ?I have reviewed the data as listed ?   ?Component Value Date/Time  ? NA 136 (A) 02/10/2022 0000  ? K 3.8 02/10/2022 0000  ? CL 104 02/10/2022 0000  ? CO2 26 (A) 02/10/2022 0000  ? GLUCOSE 133 (H) 10/08/2020 0919  ? BUN 13 02/10/2022 0000  ? CREATININE 1.1 02/10/2022 0000  ? CREATININE 1.38 (H) 10/08/2020 0919  ? CALCIUM 9.1 02/10/2022 0000  ? PROT 5.6 (A) 12/24/2021 0000  ? ALBUMIN 3.2 (A) 02/10/2022 0000  ? AST 30 02/10/2022 0000  ? AST 23 10/08/2020 0919  ? ALT 22 02/10/2022 0000  ? ALT 26 10/08/2020 0919  ? ALKPHOS 54 02/10/2022 0000  ? BILITOT 0.7 10/08/2020 0919  ? GFRNONAA 53 (L) 10/08/2020 0919  ? GFRAA >60 07/23/2020 1253  ? ?Lab Results  ?Component Value Date  ? WBC 6.7 02/10/2022  ? NEUTROABS 4.22 02/10/2022  ? HGB 14.9 02/10/2022  ? HCT 45 02/10/2022  ? MCV 88  12/24/2021  ? PLT 167 02/10/2022  ? ?Imaging: ? ?[facility] Clinician Interpretation: I have personally reviewed the CNS images as listed.  My interpretation, in the context of the patient's clinical presentation, is treatme

## 2022-03-24 ENCOUNTER — Telehealth: Payer: Self-pay | Admitting: Internal Medicine

## 2022-03-24 NOTE — Telephone Encounter (Signed)
Per 4/17 los called and left message for pt about upcoming appointments.  Call back number left if changes are needed ?

## 2022-03-25 ENCOUNTER — Other Ambulatory Visit: Payer: Self-pay | Admitting: Radiation Therapy

## 2022-03-26 ENCOUNTER — Other Ambulatory Visit: Payer: Self-pay

## 2022-03-26 DIAGNOSIS — C7931 Secondary malignant neoplasm of brain: Secondary | ICD-10-CM

## 2022-03-26 MED ORDER — CAPMATINIB HCL 200 MG PO TABS: 400.0000 mg | ORAL_TABLET | Freq: Two times a day (BID) | ORAL | 3 refills | Status: DC

## 2022-03-27 ENCOUNTER — Other Ambulatory Visit: Payer: No Typology Code available for payment source

## 2022-03-31 ENCOUNTER — Ambulatory Visit: Payer: No Typology Code available for payment source | Admitting: Internal Medicine

## 2022-04-01 ENCOUNTER — Other Ambulatory Visit: Payer: Self-pay | Admitting: Radiation Therapy

## 2022-04-06 ENCOUNTER — Other Ambulatory Visit: Payer: Self-pay | Admitting: Hematology and Oncology

## 2022-04-06 DIAGNOSIS — C7931 Secondary malignant neoplasm of brain: Secondary | ICD-10-CM

## 2022-04-06 MED ORDER — CAPMATINIB HCL 200 MG PO TABS: 400.0000 mg | ORAL_TABLET | Freq: Two times a day (BID) | ORAL | 3 refills | Status: DC

## 2022-04-13 ENCOUNTER — Inpatient Hospital Stay: Payer: No Typology Code available for payment source | Attending: Internal Medicine | Admitting: Internal Medicine

## 2022-04-13 DIAGNOSIS — C7931 Secondary malignant neoplasm of brain: Secondary | ICD-10-CM | POA: Diagnosis not present

## 2022-04-13 NOTE — Progress Notes (Deleted)
I connected with Alex Cordova on 04/13/22 at  9:15 AM EDT by telephone visit and verified that I am speaking with the correct person using two identifiers.  ?I discussed the limitations, risks, security and privacy concerns of performing an evaluation and management service by telemedicine and the availability of in-person appointments. I also discussed with the patient that there may be a patient responsible charge related to this service. The patient expressed understanding and agreed to proceed.  ?Other persons participating in the visit and their role in the encounter:  n/a  ?Patient's location:  Home  ?Provider's location:  Office  ?Chief Complaint:  No diagnosis found. ? ?History of Present Ilness: Alex Cordova denies new or progressive changes today.  No recurrence of left sided weakness, no seizures.  He remains active, on Capmatinib.     ?Observations: Language and cognition at baseline ? ?Imaging: ? ?MR BRAIN W WO CONTRAST ? ?Result Date: 03/19/2022 ?CLINICAL DATA:  Metastatic lung cancer follow-up EXAM: MRI HEAD WITHOUT AND WITH CONTRAST TECHNIQUE: Multiplanar, multiecho pulse sequences of the brain and surrounding structures were obtained without and with intravenous contrast. CONTRAST:  97mL MULTIHANCE GADOBENATE DIMEGLUMINE 529 MG/ML IV SOLN COMPARISON:  12/26/2021 FINDINGS: Brain: Increase in size of right posterior frontal lesion. For example, measuring 1.9 x 2.3 cm on series 17, image 104 (previously up to 1.5 cm). Increased associated susceptibility likely reflecting chronic blood products. Increase in size of right parietal lesion measuring 6 mm on image 113 (previously 5 mm). Stable enhancement at the right temporal resection site. No new mass or abnormal enhancement. Increased right frontoparietal edema.  No significant mass effect. Few small foci of reduced diffusion primarily in the left frontoparietal lobes. No associated enhancement. No hydrocephalus or extra-axial  collection. Stable additional patchy foci of T2 hyperintensity in the supratentorial white matter that may reflect chronic microvascular ischemic changes. Vascular: Major vessel flow voids at the skull base are preserved. Skull and upper cervical spine: Normal marrow signal is preserved. Sinuses/Orbits: Paranasal sinuses are aerated. Orbits are unremarkable. Other: Sella is unremarkable.  Mastoid air cells are clear. IMPRESSION: Increased size of right frontal and parietal lesions with greater surrounding edema. No new mass. Few small left cerebral acute infarcts primarily in the frontoparietal lobes. These results will be called to the ordering clinician or representative by the Radiologist Assistant, and communication documented in the PACS or [employer]. Electronically Signed   By: Macy Mis M.D.   On: 03/19/2022 13:39   ? ?Assessment and Plan: No diagnosis found. ? ?Alex Cordova is clinically stable or improved, now on decadron 2mg .   ? ?Recommended decreasing decadron to 1mg  daily if tolerated.  Will follow up after MRI brain in June  ?  ?Follow Up Instructions: RTC following MRI brain in 1 month ? ?I discussed the assessment and treatment plan with the patient.  The patient was provided an opportunity to ask questions and all were answered.  The patient agreed with the plan and demonstrated understanding of the instructions.   ? ?The patient was advised to call back or seek an in-person evaluation if the symptoms worsen or if the condition fails to improve as anticipated.  I provided 5-10 minutes of non-face-to-face time during this enocunter. ? ?Ventura Sellers, MD ? ? ?I provided 15 minutes of non face-to-face telephone visit time during this encounter, and > 50% was spent counseling as documented under my assessment & plan.  ? ?

## 2022-04-13 NOTE — Progress Notes (Signed)
I connected with Alex Cordova on 04/13/22 at 10:30 AM EDT by telephone visit and verified that I am speaking with the correct person using two identifiers.  ?I discussed the limitations, risks, security and privacy concerns of performing an evaluation and management service by telemedicine and the availability of in-person appointments. I also discussed with the patient that there may be a patient responsible charge related to this service. The patient expressed understanding and agreed to proceed.  ?Other persons participating in the visit and their role in the encounter:  n/a  ?Patient's location:  Home  ?Provider's location:  Office  ?Chief Complaint:  Malignant neoplasm metastatic to brain [facility]) ? ?History of Present Ilness: Alex Cordova denies new or progressive changes today.  No recurrence of left sided weakness, no seizures.  He remains active, on Capmatinib.     ?Observations: Language and cognition at baseline ? ?Imaging: ? ?MR BRAIN W WO CONTRAST ? ?Result Date: 03/19/2022 ?CLINICAL DATA:  Metastatic lung cancer follow-up EXAM: MRI HEAD WITHOUT AND WITH CONTRAST TECHNIQUE: Multiplanar, multiecho pulse sequences of the brain and surrounding structures were obtained without and with intravenous contrast. CONTRAST:  4mL MULTIHANCE GADOBENATE DIMEGLUMINE 529 MG/ML IV SOLN COMPARISON:  12/26/2021 FINDINGS: Brain: Increase in size of right posterior frontal lesion. For example, measuring 1.9 x 2.3 cm on series 17, image 104 (previously up to 1.5 cm). Increased associated susceptibility likely reflecting chronic blood products. Increase in size of right parietal lesion measuring 6 mm on image 113 (previously 5 mm). Stable enhancement at the right temporal resection site. No new mass or abnormal enhancement. Increased right frontoparietal edema.  No significant mass effect. Few small foci of reduced diffusion primarily in the left frontoparietal lobes. No associated enhancement. No hydrocephalus  or extra-axial collection. Stable additional patchy foci of T2 hyperintensity in the supratentorial white matter that may reflect chronic microvascular ischemic changes. Vascular: Major vessel flow voids at the skull base are preserved. Skull and upper cervical spine: Normal marrow signal is preserved. Sinuses/Orbits: Paranasal sinuses are aerated. Orbits are unremarkable. Other: Sella is unremarkable.  Mastoid air cells are clear. IMPRESSION: Increased size of right frontal and parietal lesions with greater surrounding edema. No new mass. Few small left cerebral acute infarcts primarily in the frontoparietal lobes. These results will be called to the ordering clinician or representative by the Radiologist Assistant, and communication documented in the PACS or [employer]. Electronically Signed   By: Macy Mis M.D.   On: 03/19/2022 13:39   ? ?Assessment and Plan: Malignant neoplasm metastatic to brain [facility]) ? ?Alex Cordova is clinically stable or improved, now on decadron 2mg .   ? ?Recommended decreasing decadron to 1mg  daily if tolerated.  Will follow up after MRI brain in June  ?  ?Follow Up Instructions: RTC following MRI brain in 1 month ? ?I discussed the assessment and treatment plan with the patient.  The patient was provided an opportunity to ask questions and all were answered.  The patient agreed with the plan and demonstrated understanding of the instructions.   ? ?The patient was advised to call back or seek an in-person evaluation if the symptoms worsen or if the condition fails to improve as anticipated.  I provided 5-10 minutes of non-face-to-face time during this enocunter. ? ?Ventura Sellers, MD ? ? ?I provided 15 minutes of non face-to-face telephone visit time during this encounter, and > 50% was spent counseling as documented under my assessment & plan.  ? ?

## 2022-04-14 ENCOUNTER — Inpatient Hospital Stay (INDEPENDENT_AMBULATORY_CARE_PROVIDER_SITE_OTHER): Payer: No Typology Code available for payment source | Admitting: Oncology

## 2022-04-14 ENCOUNTER — Other Ambulatory Visit (HOSPITAL_COMMUNITY): Payer: Self-pay

## 2022-04-14 ENCOUNTER — Other Ambulatory Visit: Payer: Self-pay

## 2022-04-14 ENCOUNTER — Encounter: Payer: Self-pay | Admitting: Hematology and Oncology

## 2022-04-14 ENCOUNTER — Other Ambulatory Visit: Payer: Self-pay | Admitting: Oncology

## 2022-04-14 VITALS — BP 140/83 | HR 63 | Temp 97.8°F | Resp 18 | Ht 70.5 in | Wt 241.2 lb

## 2022-04-14 DIAGNOSIS — C3412 Malignant neoplasm of upper lobe, left bronchus or lung: Secondary | ICD-10-CM

## 2022-04-14 DIAGNOSIS — C7931 Secondary malignant neoplasm of brain: Secondary | ICD-10-CM

## 2022-04-14 MED ORDER — CAPMATINIB HCL 200 MG PO TABS
400.0000 mg | ORAL_TABLET | Freq: Two times a day (BID) | ORAL | 3 refills | Status: DC
  Filled 2022-04-14: qty 168, 42d supply, fill #0

## 2022-04-14 MED ORDER — FUROSEMIDE 20 MG PO TABS: ORAL_TABLET | ORAL | 3 refills | Status: DC

## 2022-04-14 NOTE — Progress Notes (Signed)
?[facility]  ?[address] ?[place],  [place]  [address] ?[phone] ? ?Clinic Day:  04/14/2022 ? ?Referring physician: Landry Mellow, MD ? ?HISTORY OF PRESENT ILLNESS:  ?The patient is a 76 y.o. male with metastatic non-small cell lung cancer, which includes brain metastasis and a metastatic focus of disease to his left acromion process.  The patient comes in today to go over his CT scans to ascertain his new disease baseline.  He continues to take capmatinib 400 mg BID;  he uses Lasix 20 to offset any fluid retention this medication may cause.  The patient claims he had another seizure recently, which led to him getting back on Keppra.  His brain MRI in April 2023 showed enlarging masses.  However, per the patient claims it was believed the enlargement in his brain masses reflect a postradiation response.  There was a brief amount of time when he lost the function of his left arm/hand, but this has returned since he was placed back on steroids.  His neuro-oncologist is currently working on weaning down his steroids over time.   With respect to his lung cancer, the patient denies having any new respiratory symptoms or other findings which concern him for overt signs of disease progression. ?For ?With respect to his lung cancer history, his initial lung cancer pathology showed squamous cell carcinoma.  However, pathology from his brain resection in 2022 showed adenocarcinoma. He initially received carboplatin/paclitaxel/pembrolizumab for his disease management before being switched to maintenance pembrolizumab.   He stayed on this agent for numerous months, which helped with his disease control.  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   He has also undergone stereotactic radiation in [place] to treat his primary left lung mass.  ? ?PHYSICAL EXAM:  ?Blood pressure  140/83, pulse 63, temperature 97.8 ?F (36.6 ?C), resp. rate 18, height 5' 10.5" (1.791 m), weight 241 lb 3.2 oz (109.4 kg), SpO2 93 %. ?Wt Readings from Last 3 Encounters:  ?04/14/22 241 lb 3.2 oz (109.4 kg)  ?03/23/22 242 lb 4.8 oz (109.9 kg)  ?02/10/22 238 lb 8 oz (108.2 kg)  ? ?Body mass index is 34.12 kg/m?Marland Kitchen ?Performance status: 1 ?Physical Exam ?Constitutional:   ?   Appearance: Normal appearance. He is not ill-appearing.  ?HENT:  ?   Mouth/Throat:  ?   Mouth: Mucous membranes are moist.  ?   Pharynx: Oropharynx is clear. No oropharyngeal exudate or posterior oropharyngeal erythema.  ?Cardiovascular:  ?   Rate and Rhythm: Normal rate and regular rhythm.  ?   Heart sounds: No murmur heard. ?  No friction rub. No gallop.  ?Pulmonary:  ?   Effort: Pulmonary effort is normal. No respiratory distress.  ?   Breath sounds: Normal breath sounds. No wheezing, rhonchi or rales.  ?Abdominal:  ?   General: Bowel sounds are normal. There is no distension.  ?   Palpations: Abdomen is soft. There is no mass.  ?   Tenderness: There is no abdominal tenderness.  ?Musculoskeletal:     ?   General: No swelling.  ?   Right lower leg: No edema.  ?   Left lower leg: No edema.  ?Lymphadenopathy:  ?   Cervical: No cervical adenopathy.  ?   Upper Body:  ?   Right upper body: No supraclavicular or axillary adenopathy.  ?   Left upper body: No  supraclavicular or axillary adenopathy.  ?   Lower Body: No right inguinal adenopathy. No left inguinal adenopathy.  ?Skin: ?   General: Skin is warm.  ?   Coloration: Skin is not jaundiced.  ?   Findings: No lesion or rash.  ?Neurological:  ?   General: No focal deficit present.  ?   Mental Status: He is alert and oriented to person, place, and time. Mental status is at baseline.  ?Psychiatric:     ?   Mood and Affect: Mood normal.     ?   Behavior: Behavior normal.     ?   Thought Content: Thought content normal.  ? ?SCANS: CT scans of his chest/abdomen/pelvis done yesterday revealed the  following: ?FINDINGS: ?CT CHEST FINDINGS ? ?Cardiovascular: Normal heart size. Trace pericardial fluid. Normal ?caliber thoracic aorta with mild atherosclerotic disease. No ?suspicious filling defects of the central pulmonary arteries. ? ?Mediastinum/Nodes: Esophagus and thyroid are unremarkable. No ?pathologically enlarged lymph nodes seen in the chest. ? ?Lungs/Pleura: Central airways are patent. Left upper lobe ?postradiation change demonstrates increased linear area and ?decreased consolidation when compared with prior exam. No new or ?enlarging pulmonary nodules. ? ?Musculoskeletal: No chest wall mass or suspicious bone lesions ?identified. ? ?CT ABDOMEN PELVIS FINDINGS ? ?Hepatobiliary: No focal liver abnormality is seen. No gallstones, ?gallbladder wall thickening, or biliary dilatation. ? ?Pancreas: Unremarkable. No pancreatic ductal dilatation or ?surrounding inflammatory changes. ? ?Spleen: Normal in size without focal abnormality. ? ?Adrenals/Urinary Tract: Bilateral adrenal glands are unremarkable. ?Kidneys enhance symmetrically no evidence of hydronephrosis or ?nephrolithiasis. ? ?Stomach/Bowel: Stomach is within normal limits. Appendix appears ?normal. Diverticulosis. No evidence of bowel wall thickening, ?distention, or inflammatory changes. ? ?Vascular/Lymphatic: Aortic atherosclerosis. No enlarged abdominal or ?pelvic lymph nodes. ? ?Reproductive: Prostate is unremarkable. ? ?Other: Small fat containing left inguinal hernia. ? ?Musculoskeletal: No acute or significant osseous findings. ? ?IMPRESSION: ?1. Evolving postradiation change of the left upper lobe. No evidence ?of recurrent or metastatic disease. ?2. Aortic Atherosclerosis (ICD10-I70.0). ? ?ASSESSMENT & PLAN:  ?Assessment/Plan:  A 76 y.o. 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  His initial biopsy revealed squamous cell carcinoma, but his metastatic temporal lobe lesion showed  adenocarcinoma.  In clinic today, went over all of his CT scan images with him, for which she can see that his disease remains under ideal control.  Based upon this, he will continue to take capmatinib 400 mg twice daily.  He will also continue to take Lasix to offset any fluid retention this medication and his steroids may cause.  He knows it remains imperative for him to stay on Keppra indefinitely.  Of note, the patient is already scheduled for another brain MRI in the forthcoming weeks to ensure the lesions same have not progressed in size.  If so, repeat stereotactic radiation may need to be considered for his brain lesions.  Clinically, this gentleman appears to be doing very well.  I will see him back in 2 months for repeat clinical assessment.  The patient understands all the plans discussed today and is in agreement with them.   ? ? ?I, Rita Ohara, am acting as scribe for Marice Potter, MD   ? ?I have reviewed this report as typed by the medical scribe, and it is complete and accurate. ? ?Elice Crigger Macarthur Critchley, MD ? ? ? ?  ?

## 2022-04-15 ENCOUNTER — Encounter: Payer: Self-pay | Admitting: Oncology

## 2022-04-15 ENCOUNTER — Other Ambulatory Visit: Payer: Self-pay | Admitting: Oncology

## 2022-04-16 ENCOUNTER — Other Ambulatory Visit: Payer: Self-pay | Admitting: Oncology

## 2022-04-17 ENCOUNTER — Other Ambulatory Visit: Payer: Self-pay | Admitting: Hematology and Oncology

## 2022-04-17 ENCOUNTER — Telehealth: Payer: Self-pay

## 2022-04-17 DIAGNOSIS — C7931 Secondary malignant neoplasm of brain: Secondary | ICD-10-CM

## 2022-04-17 MED ORDER — CAPMATINIB HCL 200 MG PO TABS: 400.0000 mg | ORAL_TABLET | Freq: Two times a day (BID) | ORAL | 3 refills | Status: DC

## 2022-04-17 NOTE — Telephone Encounter (Signed)
Oral Oncology Pharmacist Encounter ? ?Prescription refill for capmatinib sent to [facility] in error. Patient fills medication through [facility]. Rx redirected to [facility].  ? ?Drema Halon, PharmD ?Hematology/Oncology Clinical Pharmacist ?[facility] ?[phone] ?04/17/2022 9:16 AM ? ? ? ?

## 2022-04-20 ENCOUNTER — Other Ambulatory Visit: Payer: Self-pay | Admitting: Hematology and Oncology

## 2022-04-20 MED ORDER — FUROSEMIDE 20 MG PO TABS: ORAL_TABLET | ORAL | 3 refills | Status: DC

## 2022-04-22 ENCOUNTER — Other Ambulatory Visit: Payer: Self-pay | Admitting: Internal Medicine

## 2022-04-22 DIAGNOSIS — C3412 Malignant neoplasm of upper lobe, left bronchus or lung: Secondary | ICD-10-CM

## 2022-05-07 ENCOUNTER — Other Ambulatory Visit: Payer: Self-pay | Admitting: *Deleted

## 2022-05-08 ENCOUNTER — Ambulatory Visit
Admission: RE | Admit: 2022-05-08 | Discharge: 2022-05-08 | Disposition: A | Discharge status: Home / Self Care | Payer: No Typology Code available for payment source | Source: Ambulatory Visit | Attending: Internal Medicine | Admitting: Internal Medicine

## 2022-05-08 DIAGNOSIS — C7931 Secondary malignant neoplasm of brain: Secondary | ICD-10-CM

## 2022-05-08 IMAGING — MR MR HEAD WO/W CM
17 series · 48 of 48 positions shown · IV contrast (multihance)
Comparison: [DATE] and earlier.
COMPARISON: [DATE] and earlier.

Addendum:
CLINICAL DATA: 76-year-old male with metastatic lung cancer.
Treated with surgery and SRS in [UG], salvage SRS in [DATE].
Progression of right frontal and parietal lesions indeterminate for
treatment affect in BAN, on Decadron.

EXAM:
MRI HEAD WITHOUT AND WITH CONTRAST
TECHNIQUE: Multiplanar, multiecho pulse sequences of the brain and surrounding
structures were obtained without and with intravenous contrast.
CONTRAST:  20mL MULTIHANCE GADOBENATE DIMEGLUMINE 529 MG/ML IV SOLN

[Series 2: FLAIR · sagittal · 3.0mm · 0.81mm/px · 1 of 40 slices shown (1 of 2)]
[im 1/40]
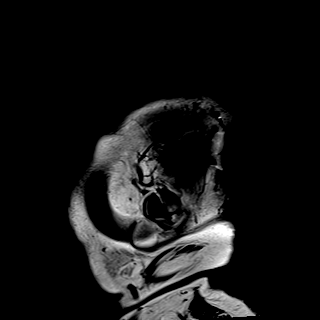

[Series 3: DWI · axial · 3.0mm · 1.62mm/px · 1 of 84 slices shown (1 of 2)]
[im 1/84]
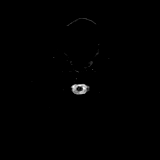

[Series 4: DWI · axial · 3.0mm · 1.62mm/px · 1 of 42 slices shown (2 of 2)]
[im 1/42]
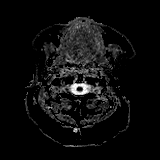

[Series 5: T2 · axial · 5.0mm · 0.65mm/px · 1 of 28 slices shown (1 of 2)]
[im 1/28]
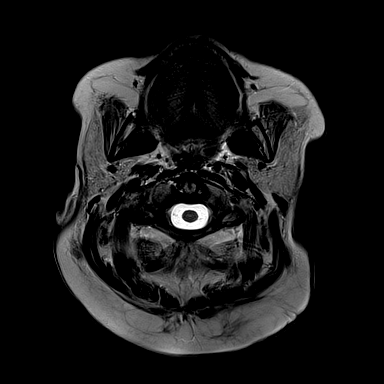

[Series 7: swi_images · axial · 1.5mm · 0.98mm/px · 1 of 112 slices shown]
[im 1/112]
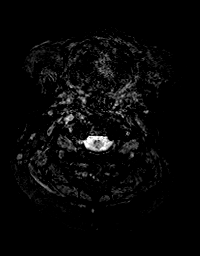

[Series 8: FLAIR · axial · 3.0mm · 0.94mm/px · 1 of 60 slices shown (2 of 2)]
[im 1/60]
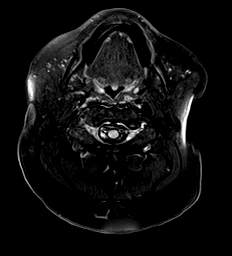

[Series 9: T2 · axial · non-contrast · 1.0mm · 0.94mm/px · z∈[-109,+62]mm · 2 of 176 slices shown (2 of 2)]
[im 1/176]
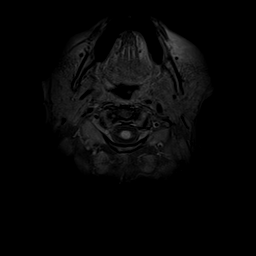
[im 176/176]
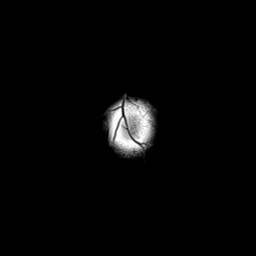

[Series 10: perfusion axial · axial · 5.0mm · 1.95mm/px · z∈[-108,+60]mm · 28 of 1734 slices shown]
[im 1/1734]
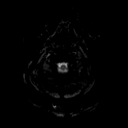
[im 65/1734]
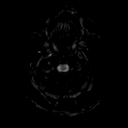
[im 129/1734]
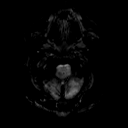
[im 193/1734]
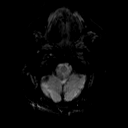
[im 257/1734]
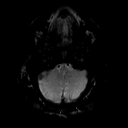
[im 321/1734]
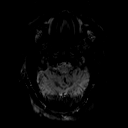
[im 386/1734]
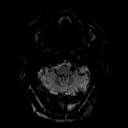
[im 450/1734]
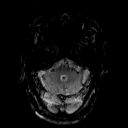
[im 514/1734]
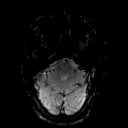
[im 578/1734]
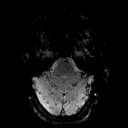
[im 642/1734]
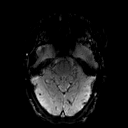
[im 707/1734]
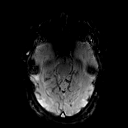
[im 771/1734]
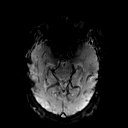
[im 835/1734]
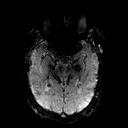
[im 899/1734]
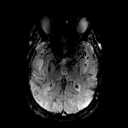
[im 963/1734]
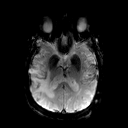
[im 1027/1734]
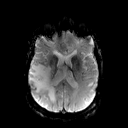
[im 1092/1734]
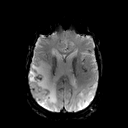
[im 1156/1734]
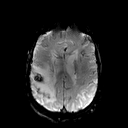
[im 1220/1734]
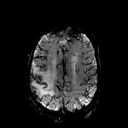
[im 1284/1734]
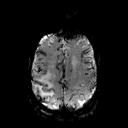
[im 1348/1734]
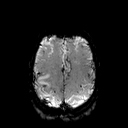
[im 1413/1734]
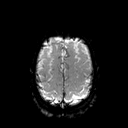
[im 1477/1734]
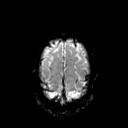
[im 1541/1734]
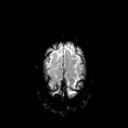
[im 1605/1734]
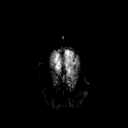
[im 1669/1734]
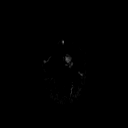
[im 1734/1734]
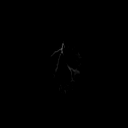

[Series 19: T2 post-contrast · coronal · 3.0mm · 0.57mm/px · 1 of 48 slices shown (1 of 2)]
[im 1/48]
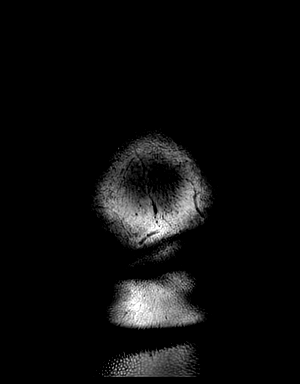

[Series 20: T2 post-contrast · axial · 1.0mm · 0.94mm/px · z∈[-109,+62]mm · 2 of 176 slices shown (2 of 2)]
[im 1/176]
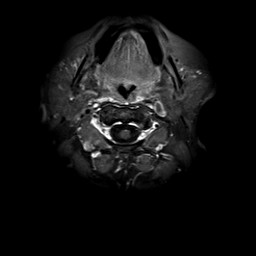
[im 176/176]
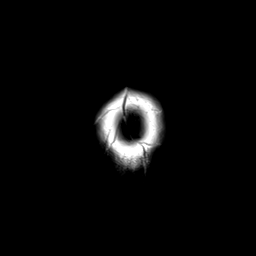

[Series 21: T1 post-contrast · axial · 1.0mm · 0.78mm/px · z∈[-111,+64]mm · 3 of 176 slices shown (1 of 2)]
[im 1/176]
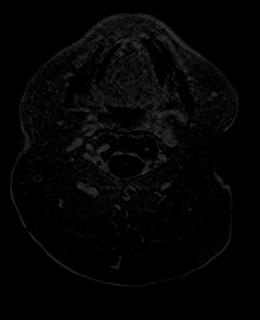
[im 88/176]
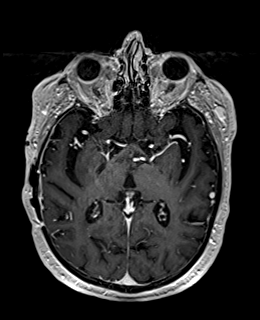
[im 176/176]
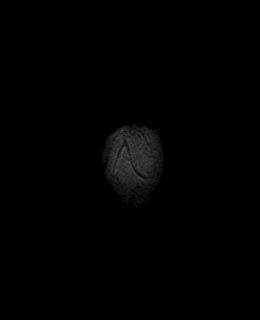

[Series 22: T1 post-contrast · coronal · 3.0mm · 0.57mm/px · 1 of 48 slices shown (2 of 2)]
[im 1/48]
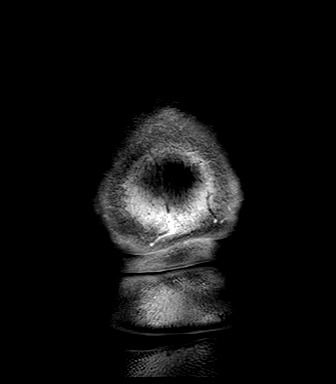

[Series 23: FLAIR post-contrast · sagittal · 3.0mm · 0.81mm/px · 1 of 40 slices shown]
[im 1/40]
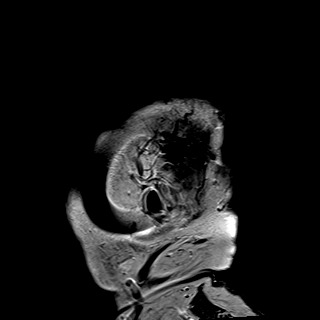

[Series 101: cbv mpr_rgb · 1 of 29 slices shown]
[im 1/29]
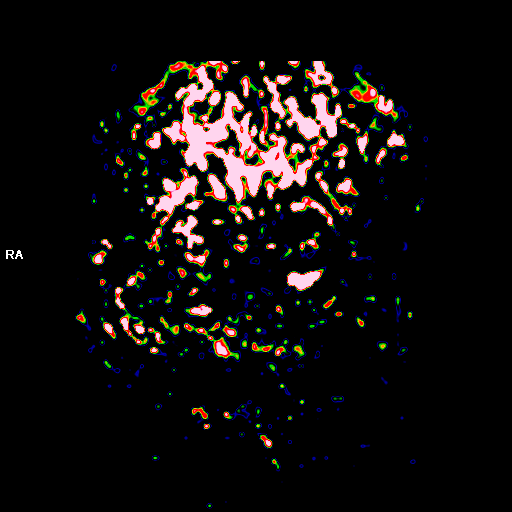

[Series 103: cbf mpr_rgb · 1 of 24 slices shown]
[im 1/24]
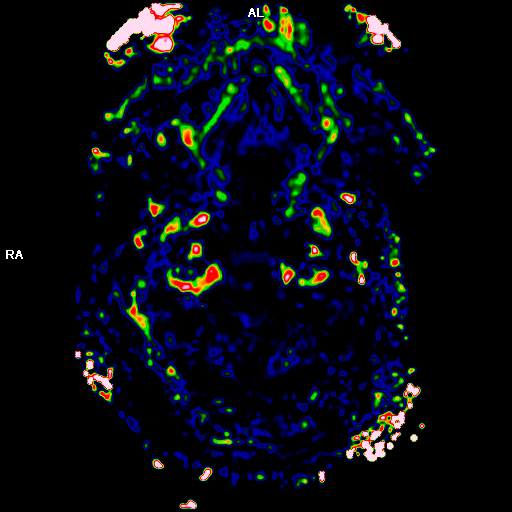

[Series 105: relmtt mpr_rgb · 1 of 29 slices shown]
[im 1/29]
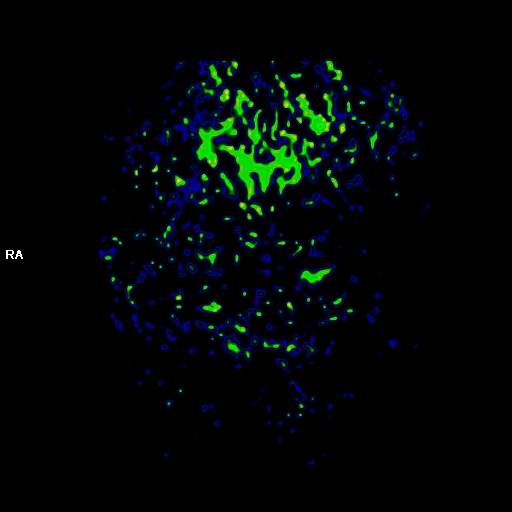

[Series 107: relccbv mpr_rgb · 1 of 28 slices shown]
[im 1/28]
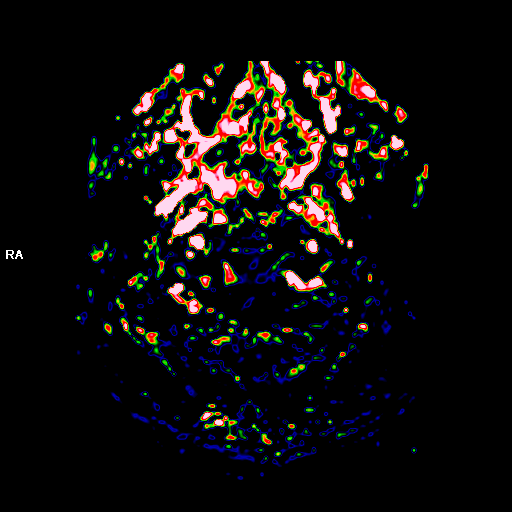

[48 of 48 positions shown; findings below may reference images not displayed]

FINDINGS: Brain: Round and irregular enhancing 2.6 cm mass of the right middle
frontal gyrus at the central sulcus shows additional increase by 2-3
mm since BAN. Regional abnormal T2 and FLAIR hyperintensity
remains confluent but has regressed since that time (series 8, image
46 today versus series 12, image 22 previously). BAN perfusion
performed, although no fused images are available at this time. On
series 107, image 19 (relative cerebral volume) and series 103,
image 19 (CBF) there seems to be relatively decreased perfusion to
normal brain.

Smaller right parietal lobe round enhancing lesion has also mildly
increased since [DATE] mm to 9 mm diameter now (also series
21, image 118). The T2 and FLAIR hyperintensity here is contiguous
with the larger area in the posterior frontal lobe and not
significantly changed. This lesion is difficult to delineate on the
provided perfusion.

Chronic post craniotomy changes right posterolateral temporal lobe
with regional dural thickening and mild nodular parenchymal
enhancement appears stable to decreased from last year. T2 and FLAIR
hyperintensity there is stable. No regional mass effect.

No new abnormal intracranial enhancement identified.

The scattered small left hemisphere infarcts seen in BAN appear
evolved/resolved as expected. Questionable punctate new left centrum
semiovale white matter infarct on series 3, image 75. No associated
enhancement. Underlying chronic white matter small vessel disease,
gliosis.

No midline shift, ventriculomegaly, extra-axial collection or acute
intracranial hemorrhage. Cervicomedullary junction and pituitary are
within normal limits.

Vascular: Major intracranial vascular flow voids are stable. The
major dural venous sinuses are enhancing and appear to be patent.

Skull and upper cervical spine: Negative visible cervical spine and
spinal cord. Visualized bone marrow signal is within normal limits.
Right temporal craniotomy changes.

Sinuses/Orbits: Stable, negative.

Other: Mastoids remain clear. Visible internal auditory structures
appear normal. No acute scalp soft tissue finding.
IMPRESSION: 1. Posterior right frontal and right parietal lobe lesions
demonstrate mild further enlargement since BAN. However, regional
T2/FLAIR vasogenic edema has mildly regressed.

2. BAN Perfusion performed, although no fused images are available
at this time.
Preliminarily, perfusion features of the dominant right frontal
lesion favor Pseudoprogression. But the right parietal lesion is
poorly delineated.
An addendum will be issued once CBV/Post Contrast fused images can
be analyzed.

3. Satisfactory postoperative appearance of the right lateral
temporal lobe. No new metastatic disease identified.

4. Expected evolution of the scattered small left hemisphere
infarcts seen in BAN. There is a punctate acute to subacute left
corona radiata infarct at this time.

ADDENDUM:
Unfortunately, fusing the images is not feasible at this time. Based
on the imaging available, Pseudoprogression is favored over true
progression. Recommend repeat short interval follow-up (suggest 6-8
weeks, which is in line with the patient's planned Neuro-Oncology
follow-up).

*** End of Addendum ***
FINDINGS: Brain: Round and irregular enhancing 2.6 cm mass of the right middle
frontal gyrus at the central sulcus shows additional increase by 2-3
mm since BAN. Regional abnormal T2 and FLAIR hyperintensity
remains confluent but has regressed since that time (series 8, image
46 today versus series 12, image 22 previously). BAN perfusion
performed, although no fused images are available at this time. On
series 107, image 19 (relative cerebral volume) and series 103,
image 19 (CBF) there seems to be relatively decreased perfusion to
normal brain.

Smaller right parietal lobe round enhancing lesion has also mildly
increased since [DATE] mm to 9 mm diameter now (also series
21, image 118). The T2 and FLAIR hyperintensity here is contiguous
with the larger area in the posterior frontal lobe and not
significantly changed. This lesion is difficult to delineate on the
provided perfusion.

Chronic post craniotomy changes right posterolateral temporal lobe
with regional dural thickening and mild nodular parenchymal
enhancement appears stable to decreased from last year. T2 and FLAIR
hyperintensity there is stable. No regional mass effect.

No new abnormal intracranial enhancement identified.

The scattered small left hemisphere infarcts seen in BAN appear
evolved/resolved as expected. Questionable punctate new left centrum
semiovale white matter infarct on series 3, image 75. No associated
enhancement. Underlying chronic white matter small vessel disease,
gliosis.

No midline shift, ventriculomegaly, extra-axial collection or acute
intracranial hemorrhage. Cervicomedullary junction and pituitary are
within normal limits.

Vascular: Major intracranial vascular flow voids are stable. The
major dural venous sinuses are enhancing and appear to be patent.

Skull and upper cervical spine: Negative visible cervical spine and
spinal cord. Visualized bone marrow signal is within normal limits.
Right temporal craniotomy changes.

Sinuses/Orbits: Stable, negative.

Other: Mastoids remain clear. Visible internal auditory structures
appear normal. No acute scalp soft tissue finding.
IMPRESSION: 1. Posterior right frontal and right parietal lobe lesions
demonstrate mild further enlargement since BAN. However, regional
T2/FLAIR vasogenic edema has mildly regressed.

2. BAN Perfusion performed, although no fused images are available
at this time.
Preliminarily, perfusion features of the dominant right frontal
lesion favor Pseudoprogression. But the right parietal lesion is
poorly delineated.
An addendum will be issued once CBV/Post Contrast fused images can
be analyzed.

3. Satisfactory postoperative appearance of the right lateral
temporal lobe. No new metastatic disease identified.

4. Expected evolution of the scattered small left hemisphere
infarcts seen in BAN. There is a punctate acute to subacute left
corona radiata infarct at this time.

## 2022-05-08 MED ORDER — GADOBENATE DIMEGLUMINE 529 MG/ML IV SOLN
20.0000 mL | Freq: Once | INTRAVENOUS | Status: AC | PRN
  Administered 2022-05-08: 20 mL via INTRAVENOUS

## 2022-05-11 ENCOUNTER — Inpatient Hospital Stay: Payer: No Typology Code available for payment source

## 2022-05-12 ENCOUNTER — Other Ambulatory Visit: Payer: Self-pay

## 2022-05-12 ENCOUNTER — Inpatient Hospital Stay: Payer: No Typology Code available for payment source | Attending: Internal Medicine | Admitting: Internal Medicine

## 2022-05-12 VITALS — BP 154/92 | HR 60 | Temp 97.8°F | Resp 18 | Ht 70.5 in | Wt 244.7 lb

## 2022-05-12 DIAGNOSIS — C7931 Secondary malignant neoplasm of brain: Secondary | ICD-10-CM | POA: Insufficient documentation

## 2022-05-12 DIAGNOSIS — Z87891 Personal history of nicotine dependence: Secondary | ICD-10-CM | POA: Diagnosis not present

## 2022-05-12 DIAGNOSIS — Z7952 Long term (current) use of systemic steroids: Secondary | ICD-10-CM | POA: Diagnosis not present

## 2022-05-12 DIAGNOSIS — Z79899 Other long term (current) drug therapy: Secondary | ICD-10-CM | POA: Diagnosis not present

## 2022-05-12 DIAGNOSIS — C3490 Malignant neoplasm of unspecified part of unspecified bronchus or lung: Secondary | ICD-10-CM | POA: Diagnosis present

## 2022-05-12 NOTE — Progress Notes (Signed)
[facility] [place], [place] [address] [phone]   Interval Evaluation  Date of Service: 05/12/22 Patient Name: Alex Cordova Patient MRN: [id number] Patient DOB: [birth year] Provider: Ventura Sellers, MD  Identifying Statement:  Alex Cordova is a 76 y.o. male with Malignant neoplasm metastatic to brain [facility]) [C79.31]   Primary Cancer: [place] Lung Stage IV  Oncologic History: 04/18/20: Pre-op SRS R temporal 14 Gy 04/19/20: Craniotomy, resection by Dr. Saintclair Halsted 07/30/20: Salvage SRS R subcortex 09/26/21: Salvage SRS 1.5cm R frontal met  Interval History: Alex Cordova presents today for follow up after recent MRI brain.  No seizures since prior visit 2 months ago.  Decadron is still at 77m daily.  He does continues to experience fatigue, lethargy, worse on some days than others.  Back on Capmatinib now with Dr. LBobby Rumpfin AEmmons  H+P (07/23/20) Patient presents to clinic today for follow up after 3 months post-SRS/craniotomy MRI scan.  He describes no new or progressive neurologic deficits.  No headaches or seizures.  He no longer takes Keppra and he has recently completed tapering off all corticosteroids.  He remains relatively active around the home, independent with gait and activities of daily living.  Currently on observation and serial imaging monitoring with Dr. LBobby Rumpffor lung cancer.   Medications: Current Outpatient Medications on File Prior to Visit  Medication Sig Dispense Refill   acetaminophen (TYLENOL) 325 MG tablet Take 650 mg by mouth every 6 (six) hours as needed.     Alogliptin Benzoate 25 MG TABS TAKE ONE-HALF TABLET BY MOUTH DAILY FOR DIABETES     Calcium Carb-Cholecalciferol 600-10 MG-MCG TABS TAKE 1 TABLET BY MOUTH EVERY MORNING AFTER BREAKFAST     capmatinib (TABRECTA) 200 MG tablet Take 2 tablets (400 mg total) by mouth 2 (two) times daily. 120 tablet 3   cetirizine (ZYRTEC) 10 MG  tablet Take 1 tablet by mouth daily.     dexamethasone (DECADRON) 2 MG tablet Take 1 tablet (2 mg total) by mouth daily. 60 tablet 1   esomeprazole (NEXIUM) 40 MG capsule Take 40 mg by mouth daily at 12 noon.     furosemide (LASIX) 20 MG tablet Take as needed for swelling 30 tablet 3   Hypertonic Nasal Wash (SINUS RINSE BOTTLE KIT NA) 2 (two) times daily.     ibuprofen (ADVIL) 200 MG tablet Take 200 mg by mouth every 6 (six) hours as needed.     ipratropium (ATROVENT) 0.03 % nasal spray SPRAY 2 SPRAYS INTO EACH NOSTRIL EVERY 8 TO 12 HOURS     levETIRAcetam (KEPPRA) 750 MG tablet TAKE 1 TABLET BY MOUTH IN MORNING, 1 TAB MID DAY AND 2 TABS AT BEDTIME. 120 tablet 1   LORazepam (ATIVAN) 0.5 MG tablet One tab po q 6-8 hrs prn anxiety/seizures 30 tablet 1   potassium chloride SA (KLOR-CON M) 20 MEQ tablet TAKE ONE TABLET BY MOUTH DAILY (TAKE WITH FOOD)     pravastatin (PRAVACHOL) 40 MG tablet TAKE ONE TABLET BY MOUTH AT BEDTIME FOR CHOLESTEROL     Psyllium (METAMUCIL PO) Take 1 Scoop by mouth daily.     traMADol (ULTRAM) 50 MG tablet Take 1 tablet (50 mg total) by mouth every 6 (six) hours as needed. 30 tablet 0   Current Facility-Administered Medications on File Prior to Visit  Medication Dose Route Frequency Provider Last Rate Last Admin   sodium chloride flush (NS) 0.9 % injection 10 mL  10 mL Intracatheter PRN Rosanne Sack A, PA-C   10 mL at 09/08/21 0900    Allergies:  Allergies  Allergen Reactions   Metformin Diarrhea   Misc. Sulfonamide Containing Compounds    Other Swelling    Tongue swelling   Rosuvastatin     Other reaction(s): Myositis   Sulfamethoxazole Nausea Only    Childhood allergy   Past Medical History:  Past Medical History:  Diagnosis Date   Arthritis    Diabetes mellitus type 2 in obese (HCC)    Full dentures    GERD (gastroesophageal reflux disease)    Hyperlipidemia    Lung cancer ([place])    Metastatic cancer to brain ([place])    Pneumonia    Wears glasses     Past Surgical History:  Past Surgical History:  Procedure Laterality Date   APPLICATION OF CRANIAL NAVIGATION N/A 04/19/2020   Procedure: APPLICATION OF CRANIAL NAVIGATION;  Surgeon: Kary Kos, MD;  Location: [facility];  Service: Neurosurgery;  Laterality: N/A;  APPLICATION OF CRANIAL NAVIGATION   COLONOSCOPY W/ BIOPSIES AND POLYPECTOMY     CRANIOTOMY Right 04/19/2020   Procedure: Right Temporal stereotactic craniotomy;  Surgeon: Kary Kos, MD;  Location: [facility];  Service: Neurosurgery;  Laterality: Right;   FRACTURE SURGERY     right shoulder   MULTIPLE TOOTH EXTRACTIONS     Social History:  Social History   Socioeconomic History   Marital status: Married    Spouse name: Not on file   Number of children: Not on file   Years of education: Not on file   Highest education level: Not on file  Occupational History   Not on file  Tobacco Use   Smoking status: Former    Types: Cigarettes   Smokeless tobacco: Never   Tobacco comments:    quit smoking in 1972  Vaping Use   Vaping Use: Never used  Substance and Sexual Activity   Alcohol use: Not Currently    Comment: not since 1972   Drug use: Never   Sexual activity: Not on file  Other Topics Concern   Not on file  Social History Narrative   Not on file   Social Determinants of Health   Financial Resource Strain: Not on file  Food Insecurity: Not on file  Transportation Needs: Not on file  Physical Activity: Not on file  Stress: Not on file  Social Connections: Not on file  Intimate Partner Violence: Not on file   Family History: No family history on file.  Review of Systems: Constitutional: Doesn't report fevers, chills or abnormal weight loss Eyes: Doesn't report blurriness of vision Ears, nose, mouth, throat, and face: Doesn't report sore throat Respiratory: Doesn't report cough, dyspnea or wheezes Cardiovascular: Doesn't report palpitation, chest discomfort  Gastrointestinal:  Doesn't report nausea, constipation,  diarrhea GU: Doesn't report incontinence Skin: Doesn't report skin rashes Neurological: Per HPI Musculoskeletal: Doesn't report joint pain Behavioral/Psych: Doesn't report anxiety  Physical Exam: Vitals:   05/12/22 1025  BP: (!) 154/92  Pulse: 60  Resp: 18  Temp: 97.8 F (36.6 C)  SpO2: 95%    KPS: 90. General: Alert, cooperative, pleasant, in no acute distress Head: Normal EENT: No conjunctival injection or scleral icterus.  Lungs: Resp effort normal Cardiac: Regular rate Abdomen: Non-distended abdomen Skin: No rashes cyanosis or petechiae. Extremities: No clubbing or edema  Neurologic Exam: Mental Status: Awake, alert, attentive to examiner. Oriented to self and environment. Language is fluent with intact comprehension.  Cranial Nerves: Visual  acuity is grossly normal. Visual fields are full. Extra-ocular movements intact. No ptosis. Face symmetric Motor: Tone and bulk are normal. Power is full in both arms and legs. Reflexes are symmetric, no pathologic reflexes present.  Sensory: Intact to light touch Gait: Normal.   Labs: I have reviewed the data as listed    Component Value Date/Time   NA 136 (A) 02/10/2022 0000   K 3.8 02/10/2022 0000   CL 104 02/10/2022 0000   CO2 26 (A) 02/10/2022 0000   GLUCOSE 133 (H) 10/08/2020 0919   BUN 13 02/10/2022 0000   CREATININE 1.1 02/10/2022 0000   CREATININE 1.38 (H) 10/08/2020 0919   CALCIUM 9.1 02/10/2022 0000   PROT 5.6 (A) 12/24/2021 0000   ALBUMIN 3.2 (A) 02/10/2022 0000   AST 30 02/10/2022 0000   AST 23 10/08/2020 0919   ALT 22 02/10/2022 0000   ALT 26 10/08/2020 0919   ALKPHOS 54 02/10/2022 0000   BILITOT 0.7 10/08/2020 0919   GFRNONAA 53 (L) 10/08/2020 0919   GFRAA >60 07/23/2020 1253   Lab Results  Component Value Date   WBC 6.7 02/10/2022   NEUTROABS 4.22 02/10/2022   HGB 14.9 02/10/2022   HCT 45 02/10/2022   MCV 88 12/24/2021   PLT 167 02/10/2022   Imaging:  [place] Clinician Interpretation: I have  personally reviewed the CNS images as listed.  My interpretation, in the context of the patient's clinical presentation, is treatment effect vs true progression  MR BRAIN W WO CONTRAST  Result Date: 05/10/2022 CLINICAL DATA:  76 year old male with metastatic lung cancer. Treated with surgery and SRS in 2021, salvage SRS in October 2022. Progression of right frontal and parietal lesions indeterminate for treatment affect in April, on Decadron. EXAM: MRI HEAD WITHOUT AND WITH CONTRAST TECHNIQUE: Multiplanar, multiecho pulse sequences of the brain and surrounding structures were obtained without and with intravenous contrast. CONTRAST:  42m MULTIHANCE GADOBENATE DIMEGLUMINE 529 MG/ML IV SOLN COMPARISON:  03/19/2022 and earlier. FINDINGS: Brain: Round and irregular enhancing 2.6 cm mass of the right middle frontal gyrus at the central sulcus shows additional increase by 2-3 mm since April. Regional abnormal T2 and FLAIR hyperintensity remains confluent but has regressed since that time (series 8, image 46 today versus series 12, image 22 previously). DMentoneperfusion performed, although no fused images are available at this time. On series 107, image 19 (relative cerebral volume) and series 103, image 19 (CBF) there seems to be relatively decreased perfusion to normal brain. Smaller right parietal lobe round enhancing lesion has also mildly increased since April, from 7 mm to 9 mm diameter now (also series 21, image 118). The T2 and FLAIR hyperintensity here is contiguous with the larger area in the posterior frontal lobe and not significantly changed. This lesion is difficult to delineate on the provided perfusion. Chronic post craniotomy changes right posterolateral temporal lobe with regional dural thickening and mild nodular parenchymal enhancement appears stable to decreased from last year. T2 and FLAIR hyperintensity there is stable. No regional mass effect. No new abnormal intracranial enhancement identified.  The scattered small left hemisphere infarcts seen in April appear evolved/resolved as expected. Questionable punctate new left centrum semiovale white matter infarct on series 3, image 75. No associated enhancement. Underlying chronic white matter small vessel disease, gliosis. No midline shift, ventriculomegaly, extra-axial collection or acute intracranial hemorrhage. Cervicomedullary junction and pituitary are within normal limits. Vascular: Major intracranial vascular flow voids are stable. The major dural venous sinuses are enhancing and appear to  be patent. Skull and upper cervical spine: Negative visible cervical spine and spinal cord. Visualized bone marrow signal is within normal limits. Right temporal craniotomy changes. Sinuses/Orbits: Stable, negative. Other: Mastoids remain clear. Visible internal auditory structures appear normal. No acute scalp soft tissue finding. IMPRESSION: 1. Posterior right frontal and right parietal lobe lesions demonstrate mild further enlargement since April. However, regional T2/FLAIR vasogenic edema has mildly regressed. 2. DSC Perfusion performed, although no fused images are available at this time. Preliminarily, perfusion features of the dominant right frontal lesion favor Pseudoprogression. But the right parietal lesion is poorly delineated. An addendum will be issued once CBV/Post Contrast fused images can be analyzed. 3. Satisfactory postoperative appearance of the right lateral temporal lobe. No new metastatic disease identified. 4. Expected evolution of the scattered small left hemisphere infarcts seen in April. There is a punctate acute to subacute left corona radiata infarct at this time. Electronically Signed   By: Genevie Ann M.D.   On: 05/10/2022 06:28    Assessment/Plan Malignant neoplasm metastatic to brain [facility]) [C79.31]  Jishnu Jenniges is clinically stable today.  MRI brain again demonstrates progression within right frontal region treated with  radiosurgery 7+ months prior.  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  Perfusion imaging and lack of motor symptoms are supportive of radionecrosis.    Keppra should con't 1523m BID.  Decadron should stay at 169mdaily.  If left sided weakness or seizures recur will consider increasing back to 76m69m We appreciate the opportunity to participate in the care of Alex Cordova  We ask that Alex Cordova to clinic in 2 months following next brain MRI, or sooner as needed.    All questions were answered. The patient knows to call the clinic with any problems, questions or concerns. No barriers to learning were detected.  I have spent a total of 40 minutes of face-to-face and non-face-to-face time, excluding clinical staff time, preparing to see patient, ordering tests and/or medications, counseling the patient, and independently interpreting results and communicating results to the patient/family/caregiver    ZacVentura SellersD Medical Director of Neuro-Oncology [facility] [place]/06/23 12:26 PM

## 2022-05-13 ENCOUNTER — Telehealth: Payer: Self-pay | Admitting: Internal Medicine

## 2022-05-13 NOTE — Telephone Encounter (Signed)
Scheduled per 6/6 los, message left with pt

## 2022-05-15 ENCOUNTER — Other Ambulatory Visit: Payer: Self-pay | Admitting: Internal Medicine

## 2022-05-15 DIAGNOSIS — C3412 Malignant neoplasm of upper lobe, left bronchus or lung: Secondary | ICD-10-CM

## 2022-05-18 ENCOUNTER — Encounter: Payer: Self-pay | Admitting: Hematology and Oncology

## 2022-06-11 ENCOUNTER — Other Ambulatory Visit: Payer: Self-pay | Admitting: Internal Medicine

## 2022-06-11 DIAGNOSIS — C3412 Malignant neoplasm of upper lobe, left bronchus or lung: Secondary | ICD-10-CM

## 2022-06-12 ENCOUNTER — Encounter: Payer: Self-pay | Admitting: Hematology and Oncology

## 2022-06-14 NOTE — Progress Notes (Signed)
[facility]  [address] [place],  [place]  [address] [phone]  Clinic Day:  04/14/2022  Referring physician: Landry Mellow, MD  HISTORY OF PRESENT ILLNESS:  The patient is a 76 y.o. male with metastatic non-small cell lung cancer, which includes brain metastasis and a metastatic focus of disease to his left acromion process.  The patient comes in today to go over his CT scans to ascertain his new disease baseline.  He continues to take capmatinib 400 mg BID;  he uses Lasix 20 to offset any fluid retention this medication may cause.  The patient claims he had another seizure recently, which led to him getting back on Keppra.  His brain MRI in April 2023 showed enlarging masses.  However, per the patient claims it was believed the enlargement in his brain masses reflect a postradiation response.  There was a brief amount of time when he lost the function of his left arm/hand, but this has returned since he was placed back on steroids.  His neuro-oncologist is currently working on weaning down his steroids over time.   With respect to his lung cancer, the patient denies having any new respiratory symptoms or other findings which concern him for overt signs of disease progression. For With respect to his lung cancer history, his initial lung cancer pathology showed squamous cell carcinoma.  However, pathology from his brain resection in 2022 showed adenocarcinoma. He initially received carboplatin/paclitaxel/pembrolizumab for his disease management before being switched to maintenance pembrolizumab.   He stayed on this agent for numerous months, which helped with his disease control.  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   He has also undergone stereotactic radiation in [place] to treat his primary left lung mass.   PHYSICAL EXAM:  There were no  vitals taken for this visit. Wt Readings from Last 3 Encounters:  05/12/22 244 lb 11.2 oz (111 kg)  04/14/22 241 lb 3.2 oz (109.4 kg)  03/23/22 242 lb 4.8 oz (109.9 kg)   There is no height or weight on file to calculate BMI. Performance status: 1 Physical Exam Constitutional:      Appearance: Normal appearance. He is not ill-appearing.  HENT:     Mouth/Throat:     Mouth: Mucous membranes are moist.     Pharynx: Oropharynx is clear. No oropharyngeal exudate or posterior oropharyngeal erythema.  Cardiovascular:     Rate and Rhythm: Normal rate and regular rhythm.     Heart sounds: No murmur heard.    No friction rub. No gallop.  Pulmonary:     Effort: Pulmonary effort is normal. No respiratory distress.     Breath sounds: Normal breath sounds. No wheezing, rhonchi or rales.  Abdominal:     General: Bowel sounds are normal. There is no distension.     Palpations: Abdomen is soft. There is no mass.     Tenderness: There is no abdominal tenderness.  Musculoskeletal:        General: No swelling.     Right lower leg: No edema.     Left lower leg: No edema.  Lymphadenopathy:     Cervical: No cervical adenopathy.     Upper Body:     Right upper body: No supraclavicular or axillary adenopathy.     Left upper body: No supraclavicular or axillary adenopathy.     Lower Body: No right inguinal adenopathy.  No left inguinal adenopathy.  Skin:    General: Skin is warm.     Coloration: Skin is not jaundiced.     Findings: No lesion or rash.  Neurological:     General: No focal deficit present.     Mental Status: He is alert and oriented to person, place, and time. Mental status is at baseline.  Psychiatric:        Mood and Affect: Mood normal.        Behavior: Behavior normal.        Thought Content: Thought content normal.   SCANS: CT scans of his chest/abdomen/pelvis done yesterday revealed the following: FINDINGS: CT CHEST FINDINGS  Cardiovascular: Normal heart size. Trace  pericardial fluid. Normal caliber thoracic aorta with mild atherosclerotic disease. No suspicious filling defects of the central pulmonary arteries.  Mediastinum/Nodes: Esophagus and thyroid are unremarkable. No pathologically enlarged lymph nodes seen in the chest.  Lungs/Pleura: Central airways are patent. Left upper lobe postradiation change demonstrates increased linear area and decreased consolidation when compared with prior exam. No new or enlarging pulmonary nodules.  Musculoskeletal: No chest wall mass or suspicious bone lesions identified.  CT ABDOMEN PELVIS FINDINGS  Hepatobiliary: No focal liver abnormality is seen. No gallstones, gallbladder wall thickening, or biliary dilatation.  Pancreas: Unremarkable. No pancreatic ductal dilatation or surrounding inflammatory changes.  Spleen: Normal in size without focal abnormality.  Adrenals/Urinary Tract: Bilateral adrenal glands are unremarkable. Kidneys enhance symmetrically no evidence of hydronephrosis or nephrolithiasis.  Stomach/Bowel: Stomach is within normal limits. Appendix appears normal. Diverticulosis. No evidence of bowel wall thickening, distention, or inflammatory changes.  Vascular/Lymphatic: Aortic atherosclerosis. No enlarged abdominal or pelvic lymph nodes.  Reproductive: Prostate is unremarkable.  Other: Small fat containing left inguinal hernia.  Musculoskeletal: No acute or significant osseous findings.  IMPRESSION: 1. Evolving postradiation change of the left upper lobe. No evidence of recurrent or metastatic disease. 2. Aortic Atherosclerosis (ICD10-I70.0).  ASSESSMENT & PLAN:  Assessment/Plan:  A 76 y.o. 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  His initial biopsy revealed squamous cell carcinoma, but his metastatic temporal lobe lesion showed adenocarcinoma.  In clinic today, went over all of his CT scan images with him, for  which she can see that his disease remains under ideal control.  Based upon this, he will continue to take capmatinib 400 mg twice daily.  He will also continue to take Lasix to offset any fluid retention this medication and his steroids may cause.  He knows it remains imperative for him to stay on Keppra indefinitely.  Of note, the patient is already scheduled for another brain MRI in the forthcoming weeks to ensure the lesions same have not progressed in size.  If so, repeat stereotactic radiation may need to be considered for his brain lesions.  Clinically, this gentleman appears to be doing very well.  I will see him back in 2 months for repeat clinical assessment.  The patient understands all the plans discussed today and is in agreement with them.     I, Rita Ohara, am acting as scribe for Marice Potter, MD    I have reviewed this report as typed by the medical scribe, and it is complete and accurate.  Rayven Hendrickson Macarthur Critchley, MD

## 2022-06-15 ENCOUNTER — Inpatient Hospital Stay: Payer: No Typology Code available for payment source | Attending: Internal Medicine

## 2022-06-15 ENCOUNTER — Other Ambulatory Visit: Payer: Self-pay | Admitting: Oncology

## 2022-06-15 ENCOUNTER — Inpatient Hospital Stay (INDEPENDENT_AMBULATORY_CARE_PROVIDER_SITE_OTHER): Payer: No Typology Code available for payment source | Admitting: Oncology

## 2022-06-15 VITALS — BP 166/79 | HR 65 | Temp 97.9°F | Resp 16 | Ht 70.5 in | Wt 252.6 lb

## 2022-06-15 DIAGNOSIS — C3412 Malignant neoplasm of upper lobe, left bronchus or lung: Secondary | ICD-10-CM

## 2022-06-15 DIAGNOSIS — Z79899 Other long term (current) drug therapy: Secondary | ICD-10-CM | POA: Insufficient documentation

## 2022-06-15 DIAGNOSIS — R300 Dysuria: Secondary | ICD-10-CM

## 2022-06-15 DIAGNOSIS — G936 Cerebral edema: Secondary | ICD-10-CM | POA: Diagnosis not present

## 2022-06-15 DIAGNOSIS — C3490 Malignant neoplasm of unspecified part of unspecified bronchus or lung: Secondary | ICD-10-CM | POA: Insufficient documentation

## 2022-06-15 DIAGNOSIS — C7931 Secondary malignant neoplasm of brain: Secondary | ICD-10-CM | POA: Diagnosis present

## 2022-06-15 DIAGNOSIS — C7951 Secondary malignant neoplasm of bone: Secondary | ICD-10-CM | POA: Diagnosis not present

## 2022-06-15 LAB — BASIC METABOLIC PANEL
BUN: 18 (ref 4–21)
CO2: 26 — AB (ref 13–22)
Chloride: 105 (ref 99–108)
Creatinine: 1.1 (ref 0.6–1.3)
Glucose: 106
Potassium: 3.6 mEq/L (ref 3.5–5.1)
Sodium: 138 (ref 137–147)

## 2022-06-15 LAB — COMPREHENSIVE METABOLIC PANEL
Albumin: 3.1 — AB (ref 3.5–5.0)
Calcium: 8.3 — AB (ref 8.7–10.7)

## 2022-06-15 LAB — HEPATIC FUNCTION PANEL
ALT: 25 U/L (ref 10–40)
AST: 25 (ref 14–40)
Alkaline Phosphatase: 46 (ref 25–125)
Bilirubin, Total: 0.6

## 2022-06-15 LAB — CBC AND DIFFERENTIAL
HCT: 44 (ref 41–53)
Hemoglobin: 14.9 (ref 13.5–17.5)
Neutrophils Absolute: 5.48
Platelets: 134 10*3/uL — AB (ref 150–400)
WBC: 8.7

## 2022-06-15 LAB — URINALYSIS, COMPLETE (UACMP) WITH MICROSCOPIC
Bacteria, UA: NONE SEEN
Bilirubin Urine: NEGATIVE
Glucose, UA: NEGATIVE mg/dL
Hgb urine dipstick: NEGATIVE
Ketones, ur: NEGATIVE mg/dL
Leukocytes,Ua: NEGATIVE
Nitrite: NEGATIVE
Protein, ur: NEGATIVE mg/dL
Specific Gravity, Urine: 1.018 (ref 1.005–1.030)
pH: 5 (ref 5.0–8.0)

## 2022-06-15 LAB — CBC: RBC: 4.94 (ref 3.87–5.11)

## 2022-06-15 MED ORDER — FUROSEMIDE 20 MG PO TABS: ORAL_TABLET | ORAL | 3 refills | Status: DC

## 2022-06-16 LAB — URINE CULTURE

## 2022-06-26 ENCOUNTER — Encounter: Payer: Self-pay | Admitting: Hematology and Oncology

## 2022-07-03 ENCOUNTER — Ambulatory Visit
Admission: RE | Admit: 2022-07-03 | Discharge: 2022-07-03 | Disposition: A | Discharge status: Home / Self Care | Payer: No Typology Code available for payment source | Source: Ambulatory Visit | Attending: Internal Medicine | Admitting: Internal Medicine

## 2022-07-03 DIAGNOSIS — C7931 Secondary malignant neoplasm of brain: Secondary | ICD-10-CM

## 2022-07-03 MED ORDER — GADOBENATE DIMEGLUMINE 529 MG/ML IV SOLN
20.0000 mL | Freq: Once | INTRAVENOUS | Status: AC | PRN
  Administered 2022-07-03: 20 mL via INTRAVENOUS

## 2022-07-06 ENCOUNTER — Inpatient Hospital Stay: Payer: No Typology Code available for payment source

## 2022-07-06 ENCOUNTER — Other Ambulatory Visit: Payer: Self-pay | Admitting: Internal Medicine

## 2022-07-06 DIAGNOSIS — C3412 Malignant neoplasm of upper lobe, left bronchus or lung: Secondary | ICD-10-CM

## 2022-07-07 ENCOUNTER — Inpatient Hospital Stay: Payer: No Typology Code available for payment source | Attending: Internal Medicine | Admitting: Internal Medicine

## 2022-07-07 ENCOUNTER — Other Ambulatory Visit: Payer: Self-pay

## 2022-07-07 VITALS — BP 147/85 | HR 78 | Temp 98.0°F | Resp 19 | Ht 70.5 in | Wt 259.1 lb

## 2022-07-07 DIAGNOSIS — Z79899 Other long term (current) drug therapy: Secondary | ICD-10-CM | POA: Diagnosis not present

## 2022-07-07 DIAGNOSIS — Z87891 Personal history of nicotine dependence: Secondary | ICD-10-CM | POA: Diagnosis not present

## 2022-07-07 DIAGNOSIS — Z7952 Long term (current) use of systemic steroids: Secondary | ICD-10-CM | POA: Diagnosis not present

## 2022-07-07 DIAGNOSIS — C7931 Secondary malignant neoplasm of brain: Secondary | ICD-10-CM | POA: Diagnosis not present

## 2022-07-07 DIAGNOSIS — C3490 Malignant neoplasm of unspecified part of unspecified bronchus or lung: Secondary | ICD-10-CM | POA: Insufficient documentation

## 2022-07-07 NOTE — Progress Notes (Signed)
[facility] [place],  [address] [phone]   Interval Evaluation  Date of Service: 07/07/22 Patient Name: Alex Cordova Patient MRN: [id number] Patient DOB: 06-May-1946 Provider: Ventura Sellers, MD  Identifying Statement:  Alex Cordova is a 76 y.o. male with Malignant neoplasm metastatic to brain [facility]) [C79.31]   Primary Cancer: [place] Lung Stage IV  Oncologic History: 04/18/20: Pre-op SRS R temporal 14 Gy 04/19/20: Craniotomy, resection by Dr. Saintclair Halsted 07/30/20: Salvage SRS R subcortex 09/26/21: Salvage SRS 1.5cm R frontal met  Interval History: Alex Cordova presents today for follow up after recent MRI brain.  He describes overall increase in fatigue, lethargy in recent months.  Denies any change in left sided strength or neglect.  Decadron is still at 78m daily.  He did have a few seizure auras this week after (temporarily) deciding to stop his Alex Cordova.  He has since resumed 15036mBID.  Back on Capmatinib now with Dr. [facility]Bobby Rumpfn [facility] (07/23/20) Patient presents to clinic today for follow up after 3 months post-SRS/craniotomy MRI scan.  He describes no new or progressive neurologic deficits.  No headaches or seizures.  He no longer takes Alex Cordova and he has recently completed tapering off all corticosteroids.  He remains relatively active around the home, independent with gait and activities of daily living.  Currently on observation and serial imaging monitoring with Dr. LeBobby Rumpfor lung cancer.   Medications: Current Outpatient Medications on File Prior to Visit  Medication Sig Dispense Refill   acetaminophen (TYLENOL) 325 MG tablet Take 650 mg by mouth every 6 (six) hours as needed.     Alogliptin Benzoate 25 MG TABS TAKE ONE-HALF TABLET BY MOUTH DAILY FOR DIABETES     Calcium Carb-Cholecalciferol 600-10 MG-MCG TABS TAKE 1 TABLET BY MOUTH EVERY MORNING AFTER BREAKFAST     capmatinib (TABRECTA)  200 MG tablet Take 2 tablets (400 mg total) by mouth 2 (two) times daily. 120 tablet 3   cetirizine (ZYRTEC) 10 MG tablet Take 1 tablet by mouth daily.     dexamethasone (DECADRON) 2 MG tablet Take 1 tablet (2 mg total) by mouth daily. 60 tablet 1   esomeprazole (NEXIUM) 40 MG capsule Take 40 mg by mouth daily at 12 noon.     furosemide (LASIX) 20 MG tablet 1-2 tabs daily; take as needed for swelling 60 tablet 3   Hypertonic Nasal Wash (SINUS RINSE BOTTLE KIT NA) 2 (two) times daily.     ibuprofen (ADVIL) 200 MG tablet Take 200 mg by mouth every 6 (six) hours as needed.     ipratropium (ATROVENT) 0.03 % nasal spray SPRAY 2 SPRAYS INTO EACH NOSTRIL EVERY 8 TO 12 HOURS     levETIRAcetam (Alex Cordova) 750 MG tablet TAKE 1 TABLET BY MOUTH IN MORNING, 1 TAB MID DAY AND 2 TABS AT BEDTIME. 120 tablet 1   LORazepam (ATIVAN) 0.5 MG tablet One tab po q 6-8 hrs prn anxiety/seizures 30 tablet 1   potassium chloride SA (KLOR-CON M) 20 MEQ tablet TAKE ONE TABLET BY MOUTH DAILY (TAKE WITH FOOD)     pravastatin (PRAVACHOL) 40 MG tablet TAKE ONE TABLET BY MOUTH AT BEDTIME FOR CHOLESTEROL     Psyllium (METAMUCIL PO) Take 1 Scoop by mouth daily.     traMADol (ULTRAM) 50 MG tablet Take 1 tablet (50 mg total) by mouth every 6 (six) hours as needed. 30 tablet 0   Current Facility-Administered Medications on File  Prior to Visit  Medication Dose Route Frequency Provider Last Rate Last Admin   sodium chloride flush (NS) 0.9 % injection 10 mL  10 mL Intracatheter PRN Mosher, Kelli A, PA-C   10 mL at 09/08/21 0900    Allergies:  Allergies  Allergen Reactions   Metformin Diarrhea   Misc. Sulfonamide Containing Compounds    Other Swelling    Tongue swelling   Rosuvastatin     Other reaction(s): Myositis   Sulfamethoxazole Nausea Only    Childhood allergy   Past Medical History:  Past Medical History:  Diagnosis Date   Arthritis    Diabetes mellitus type 2 in obese (HCC)    Full dentures    GERD (gastroesophageal  reflux disease)    Hyperlipidemia    Lung cancer ([facility])    Metastatic cancer to brain ([facility])    Pneumonia    Wears glasses    Past Surgical History:  Past Surgical History:  Procedure Laterality Date   APPLICATION OF CRANIAL NAVIGATION N/A 04/19/2020   Procedure: APPLICATION OF CRANIAL NAVIGATION;  Surgeon: Kary Kos, MD;  Location: [facility];  Service: Neurosurgery;  Laterality: N/A;  APPLICATION OF CRANIAL NAVIGATION   COLONOSCOPY W/ BIOPSIES AND POLYPECTOMY     CRANIOTOMY Right 04/19/2020   Procedure: Right Temporal stereotactic craniotomy;  Surgeon: Kary Kos, MD;  Location: [facility];  Service: Neurosurgery;  Laterality: Right;   FRACTURE SURGERY     right shoulder   MULTIPLE TOOTH EXTRACTIONS     Social History:  Social History   Socioeconomic History   Marital status: Married    Spouse name: Not on file   Number of children: Not on file   Years of education: Not on file   Highest education level: Not on file  Occupational History   Not on file  Tobacco Use   Smoking status: Former    Types: Cigarettes   Smokeless tobacco: Never   Tobacco comments:    quit smoking in 1972  Vaping Use   Vaping Use: Never used  Substance and Sexual Activity   Alcohol use: Not Currently    Comment: not since 1972   Drug use: Never   Sexual activity: Not on file  Other Topics Concern   Not on file  Social History Narrative   Not on file   Social Determinants of Health   Financial Resource Strain: Not on file  Food Insecurity: Not on file  Transportation Needs: Not on file  Physical Activity: Not on file  Stress: Not on file  Social Connections: Not on file  Intimate Partner Violence: Not on file   Family History: No family history on file.  Review of Systems: Constitutional: Doesn't report fevers, chills or abnormal weight loss Eyes: Doesn't report blurriness of vision Ears, nose, mouth, throat, and face: Doesn't report sore throat Respiratory: Doesn't report cough, dyspnea or  wheezes Cardiovascular: Doesn't report palpitation, chest discomfort  Gastrointestinal:  Doesn't report nausea, constipation, diarrhea GU: Doesn't report incontinence Skin: Doesn't report skin rashes Neurological: Per HPI Musculoskeletal: Doesn't report joint pain Behavioral/Psych: Doesn't report anxiety  Physical Exam: Vitals:   07/07/22 0916  BP: (!) 147/85  Pulse: 78  Resp: 19  Temp: 98 F (36.7 C)  SpO2: 96%    KPS: 90. General: Alert, cooperative, pleasant, in no acute distress Head: Normal EENT: No conjunctival injection or scleral icterus.  Lungs: Resp effort normal Cardiac: Regular rate Abdomen: Non-distended abdomen Skin: No rashes cyanosis or petechiae. Extremities: No clubbing or edema  Neurologic Exam: Mental Status: Awake, alert, attentive to examiner. Oriented to self and environment. Language is fluent with intact comprehension.  Cranial Nerves: Visual acuity is grossly normal. Visual fields are full. Extra-ocular movements intact. No ptosis. Face symmetric Motor: Tone and bulk are normal. Power is full in both arms and legs. Reflexes are symmetric, no pathologic reflexes present.  Sensory: Intact to light touch Gait: Normal.   Labs: I have reviewed the data as listed    Component Value Date/Time   NA 138 06/15/2022 0000   K 3.6 06/15/2022 0000   CL 105 06/15/2022 0000   CO2 26 (A) 06/15/2022 0000   GLUCOSE 133 (H) 10/08/2020 0919   BUN 18 06/15/2022 0000   CREATININE 1.1 06/15/2022 0000   CREATININE 1.38 (H) 10/08/2020 0919   CALCIUM 8.3 (A) 06/15/2022 0000   PROT 5.6 (A) 12/24/2021 0000   ALBUMIN 3.1 (A) 06/15/2022 0000   AST 25 06/15/2022 0000   AST 23 10/08/2020 0919   ALT 25 06/15/2022 0000   ALT 26 10/08/2020 0919   ALKPHOS 46 06/15/2022 0000   BILITOT 0.7 10/08/2020 0919   GFRNONAA 53 (L) 10/08/2020 0919   GFRAA >60 07/23/2020 1253   Lab Results  Component Value Date   WBC 8.7 06/15/2022   NEUTROABS 5.48 06/15/2022   HGB 14.9  06/15/2022   HCT 44 06/15/2022   MCV 88 12/24/2021   PLT 134 (A) 06/15/2022   Imaging:  [facility] Clinician Interpretation: I have personally reviewed the CNS images as listed.  My interpretation, in the context of the patient's clinical presentation, is treatment effect vs true progression  MR BRAIN W WO CONTRAST  Result Date: 07/03/2022 CLINICAL DATA:  Metastatic lung cancer, treated with surgery and SRS in 2021 EXAM: MRI HEAD WITHOUT AND WITH CONTRAST TECHNIQUE: Multiplanar, multiecho pulse sequences of the brain and surrounding structures were obtained without and with intravenous contrast. CONTRAST:  26m MULTIHANCE GADOBENATE DIMEGLUMINE 529 MG/ML IV SOLN COMPARISON:  05/08/2022 FINDINGS: Brain: Redemonstrated round, irregularly enhancing mass in the right posterior frontal lobe, which measures up to 2.1 x 2.8 x 2.0 cm (AP x TR x CC) (series 12, image 112 and series 13, image 23), previously 2.0 x 2.5 x 2.1 cm when remeasured similarly. Additional small right parietal lobe round enhancing lesion now measures 1.1 x 1.0 x 1.2 cm (series 12, image 118 and series 13, image 13), previously 0.9 x 0.8 x 0.9 cm. No new enhancing lesions. Increased T2 hyperintense signal is associated with both lesions, now extending more anteriorly into the right frontal lobe (series 8, image 39). Mild mass effect on the right lateral ventricle without significant midline shift. No hydrocephalus or extra-axial collection. No acute hemorrhage or infarct. Status post right temporal craniotomy with unchanged subjacent dural thickening and unchanged mild nodular enhancement. Vascular: Normal arterial flow voids. Normal arterial and venous enhancement. Skull and upper cervical spine: Prior right temporal craniotomy. Otherwise normal marrow signal. Sinuses/Orbits: No acute finding. Other: The mastoids are well aerated. IMPRESSION: Further interval increase in the size of posterior right frontal and right parietal lobe lesions with  slight increase in associated edema. No new enhancing lesions. Electronically Signed   By: AMerilyn BabaM.D.   On: 07/03/2022 17:13    Assessment/Plan Malignant neoplasm metastatic to brain ([facility] [C79.31]  KGreer PickerelTManganois clinically stable today.  MRI brain again demonstrates subtle progression within right frontal region treated with radiosurgery, now 10 months ago.  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  The smaller right parietal lesion, treated 24 months ago, is also progressive.     For now we will continue to monitor with serial imaging only given likelihood of treatment effect and lack of focality.  Alex Cordova should con't 1581m BID.  Decadron should stay at 146mdaily.  If left sided weakness or seizures recur will consider increasing back to 27m927m We appreciate the opportunity to participate in the care of KenMathieu Schloemer  We ask that KenEthridge Sollenbergerturn to clinic in 3 months following next brain MRI, or sooner as needed.    All questions were answered. The patient knows to call the clinic with any problems, questions or concerns. No barriers to learning were detected.  I have spent a total of 40 minutes of face-to-face and non-face-to-face time, excluding clinical staff time, preparing to see patient, ordering tests and/or medications, counseling the patient, and independently interpreting results and communicating results to the patient/family/caregiver    ZacVentura SellersD Medical Director of Neuro-Oncology [facility] [place]/01/23 9:25 AM

## 2022-07-31 ENCOUNTER — Telehealth: Payer: Self-pay | Admitting: Oncology

## 2022-07-31 NOTE — Telephone Encounter (Signed)
LVM notifying pt of upcoming scan appt date and instructions

## 2022-08-01 ENCOUNTER — Other Ambulatory Visit: Payer: Self-pay | Admitting: Internal Medicine

## 2022-08-01 DIAGNOSIS — C3412 Malignant neoplasm of upper lobe, left bronchus or lung: Secondary | ICD-10-CM

## 2022-08-17 ENCOUNTER — Encounter: Payer: Self-pay | Admitting: Oncology

## 2022-08-17 LAB — HEPATIC FUNCTION PANEL
ALT: 28 U/L (ref 10–40)
AST: 24 (ref 14–40)
Alkaline Phosphatase: 50 (ref 25–125)
Bilirubin, Total: 0.6

## 2022-08-17 LAB — CBC: RBC: 4.91 (ref 3.87–5.11)

## 2022-08-17 LAB — BASIC METABOLIC PANEL
BUN: 15 (ref 4–21)
CO2: 27 — AB (ref 13–22)
Chloride: 103 (ref 99–108)
Creatinine: 1 (ref 0.6–1.3)
Glucose: 126
Potassium: 3.9 mEq/L (ref 3.5–5.1)
Sodium: 136 — AB (ref 137–147)

## 2022-08-17 LAB — CBC AND DIFFERENTIAL
HCT: 45 (ref 41–53)
Hemoglobin: 15.1 (ref 13.5–17.5)
Neutrophils Absolute: 5.98
Platelets: 158 10*3/uL (ref 150–400)
WBC: 8.8

## 2022-08-17 LAB — COMPREHENSIVE METABOLIC PANEL
Albumin: 3.3 — AB (ref 3.5–5.0)
Calcium: 8.7 (ref 8.7–10.7)

## 2022-08-17 NOTE — Progress Notes (Signed)
[facility]  [address] [place],  [place]  [address] [phone]  Clinic Day:  08/18/2022  Referring physician: Landry Mellow, MD  HISTORY OF PRESENT ILLNESS:  The patient is a 76 y.o. male with metastatic non-small cell lung cancer, which includes brain metastasis and a metastatic focus of disease to his left acromion process.  He continues to take capmatinib for his disease management, for which the plan was for him to take this at 200 mg twice daily.  However, the patient claims there are days where he only takes 1 tablet daily due to how poorly he occasionally feels.  He comes in today complaining of increased edema, likely related to his capmatinib therapy.  Although he takes as much as 40 mg of Lasix daily for this, his fluid retention remains prominent.  Otherwise, the patient denies having any other symptoms or findings which concern him for overt signs of disease progression.  He also comes in today to go over his CT scans to ascertain his new disease baseline.  Of note, as it pertains to his brain metastasis, he continues to take Keppra 500 mg twice daily.  He notices occasional twinges in his face and left fingers.  However, he denies having any significant neurological deficits/changes which concern him for possible disease recurrence.  With respect to his lung cancer history, his initial lung cancer pathology showed squamous cell carcinoma in February 2019.   He initially received carboplatin/paclitaxel/pembrolizumab for his disease management before being transitioned to maintenance pembrolizumab.   He stayed on this agent for numerous months, which helped with his disease control.  However, pathology from his metastatic brain resection from a craniotomy in May 2021 showed adenocarcinoma.  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   He has  also undergone stereotactic radiation in [place] at [place] to treat his primary left lung mass.  He has also undergone salvage SRS to his brain lesions in August 2021 and October 2021.    PHYSICAL EXAM:  Blood pressure (!) 174/96, pulse 81, temperature 98.2 F (36.8 C), resp. rate 18, height 5' 10.5" (1.791 m), weight 265 lb 3.2 oz (120.3 kg), SpO2 92 %. Wt Readings from Last 3 Encounters:  08/18/22 265 lb 3.2 oz (120.3 kg)  07/07/22 259 lb 1.6 oz (117.5 kg)  06/15/22 252 lb 9.6 oz (114.6 kg)   Body mass index is 37.51 kg/m. Performance status: 1 Physical Exam Constitutional:      Appearance: Normal appearance. He is not ill-appearing.  HENT:     Mouth/Throat:     Mouth: Mucous membranes are moist.     Pharynx: Oropharynx is clear. No oropharyngeal exudate or posterior oropharyngeal erythema.  Cardiovascular:     Rate and Rhythm: Normal rate and regular rhythm.     Heart sounds: No murmur heard.    No friction rub. No gallop.  Pulmonary:     Effort: Pulmonary effort is normal. No respiratory distress.     Breath sounds: Normal breath sounds. No wheezing, rhonchi or rales.  Abdominal:     General: Bowel sounds are normal. There is no distension.     Palpations: Abdomen is soft. There is no mass.     Tenderness: There is no abdominal tenderness.  Musculoskeletal:        General: No swelling.     Right lower leg: No edema.     Left lower  leg: No edema.  Lymphadenopathy:     Cervical: No cervical adenopathy.     Upper Body:     Right upper body: No supraclavicular or axillary adenopathy.     Left upper body: No supraclavicular or axillary adenopathy.     Lower Body: No right inguinal adenopathy. No left inguinal adenopathy.  Skin:    General: Skin is warm.     Coloration: Skin is not jaundiced.     Findings: No lesion or rash.  Neurological:     General: No focal deficit present.     Mental Status: He is alert and oriented to person, place, and time. Mental status is at baseline.      Coordination: Coordination abnormal (Mild dysdiadochokinesis seen with his left hand).  Psychiatric:        Mood and Affect: Mood normal.        Behavior: Behavior normal.        Thought Content: Thought content normal.    SCANS:  His chest CT done yesterday revealed the following: FINDINGS: CT CHEST FINDINGS  Cardiovascular: Right Port-A-Cath tip high right atrium. Aortic atherosclerosis. Normal heart size, without pericardial effusion. Mild lipomatous hypertrophy of the intra-atrial septum. Lad coronary artery calcification. No central pulmonary embolism, on this non-dedicated study.  Mediastinum/Nodes: No supraclavicular adenopathy. No mediastinal or hilar adenopathy.  Lungs/Pleura: No pleural fluid. Posterior left upper lobe radiation induced fibrosis is not significantly changed. No local recurrence or residual disease identified.  Musculoskeletal: No acute osseous abnormality.  CT ABDOMEN PELVIS FINDINGS  Hepatobiliary: Mild hepatic steatosis. Normal gallbladder, without biliary ductal dilatation.  Pancreas: Normal, without mass or ductal dilatation.  Spleen: Normal in size, without focal abnormality.  Adrenals/Urinary Tract: Normal adrenal glands. Mild renal cortical thinning bilaterally. Left renal too small to characterize lesions . In the absence of clinically indicated signs/symptoms require(s) no independent follow-up. No hydronephrosis. Normal urinary bladder.  Stomach/Bowel: Proximal gastric underdistention. Periampullary duodenal diverticulum. Otherwise normal small bowel. Extensive colonic diverticulosis. Normal terminal ileum and appendix.  Vascular/Lymphatic: Aortic atherosclerosis. No abdominopelvic adenopathy.  Reproductive: Normal prostate.  Other: Tiny fat containing left inguinal hernia. No significant free fluid. No evidence of omental or peritoneal disease.  Musculoskeletal: No acute osseous abnormality.  IMPRESSION: 1. Similar  appearance of radiation induced fibrosis within the central left upper lobe. 2. No evidence of residual/recurrent or metastatic disease within the chest, abdomen, or pelvis. 3. Coronary artery atherosclerosis. Aortic Atherosclerosis (ICD10-I70.0). 4. Hepatic steatosis  ASSESSMENT & PLAN:  Assessment/Plan:  A 76 y.o. 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  His initial biopsy revealed squamous cell carcinoma, but his metastatic temporal lobe lesion showed adenocarcinoma.  In clinic today, I went over all of his CT scan images with him, for which he could see his disease remains under ideal control.  As there remains no evidence of new metastatic disease, the gentleman will continue to take capmatinib.  We have agreed that his dose moving forward will be 200 mg daily.  As he does have increased peripheral edema, I will arrange for him to receive extra 20 mg of IV Lasix today.  He knows to continue taking Lasix 40 mg daily at home.  He was also encouraged to weigh himself on a daily basis so he knows how much fluid weight he may be gaining while taking capmatinib.  I will also have home health see him in the forthcoming days and consider giving him ACE wraps to  help with fluid mobilization from his legs.  The patient will continue to follow-up with Dr. Mickeal Skinner in [place] for his metastatic CNS disease, which has also been under control.  Overall, he appears to be doing well.  I will see this gentleman back in 2 months for repeat clinical assessment.  The patient understands all the plans discussed today and is in agreement with them.  Lavana Huckeba Macarthur Critchley, MD

## 2022-08-18 ENCOUNTER — Inpatient Hospital Stay: Payer: No Typology Code available for payment source

## 2022-08-18 ENCOUNTER — Other Ambulatory Visit: Payer: Self-pay | Admitting: Oncology

## 2022-08-18 ENCOUNTER — Inpatient Hospital Stay: Payer: No Typology Code available for payment source | Attending: Internal Medicine | Admitting: Oncology

## 2022-08-18 VITALS — BP 174/96 | HR 81 | Temp 98.2°F | Resp 18 | Ht 70.5 in | Wt 265.2 lb

## 2022-08-18 DIAGNOSIS — R600 Localized edema: Secondary | ICD-10-CM | POA: Insufficient documentation

## 2022-08-18 DIAGNOSIS — C3490 Malignant neoplasm of unspecified part of unspecified bronchus or lung: Secondary | ICD-10-CM | POA: Insufficient documentation

## 2022-08-18 DIAGNOSIS — Z79899 Other long term (current) drug therapy: Secondary | ICD-10-CM | POA: Diagnosis not present

## 2022-08-18 DIAGNOSIS — C7931 Secondary malignant neoplasm of brain: Secondary | ICD-10-CM | POA: Insufficient documentation

## 2022-08-18 DIAGNOSIS — C7951 Secondary malignant neoplasm of bone: Secondary | ICD-10-CM | POA: Insufficient documentation

## 2022-08-18 DIAGNOSIS — C3412 Malignant neoplasm of upper lobe, left bronchus or lung: Secondary | ICD-10-CM

## 2022-08-18 MED ORDER — FUROSEMIDE 10 MG/ML IJ SOLN
20.0000 mg | Freq: Once | INTRAMUSCULAR | Status: AC
  Administered 2022-08-18: 20 mg via INTRAVENOUS
  Filled 2022-08-18: qty 2

## 2022-08-18 MED ORDER — SODIUM CHLORIDE 0.9 % IV SOLN: Freq: Once | INTRAVENOUS | Status: AC

## 2022-08-18 MED ORDER — SODIUM CHLORIDE 0.9% FLUSH: 10.0000 mL | INTRAVENOUS | Status: DC | PRN

## 2022-08-18 MED ORDER — HEPARIN SOD (PORK) LOCK FLUSH 100 UNIT/ML IV SOLN: 500.0000 [IU] | Freq: Once | INTRAVENOUS | Status: DC | PRN

## 2022-08-18 MED ORDER — TRAMADOL HCL 50 MG PO TABS: 50.0000 mg | ORAL_TABLET | Freq: Four times a day (QID) | ORAL | 0 refills | Status: DC | PRN

## 2022-08-29 ENCOUNTER — Other Ambulatory Visit: Payer: Self-pay | Admitting: Internal Medicine

## 2022-08-29 DIAGNOSIS — C3412 Malignant neoplasm of upper lobe, left bronchus or lung: Secondary | ICD-10-CM

## 2022-08-31 ENCOUNTER — Encounter: Payer: Self-pay | Admitting: Hematology and Oncology

## 2022-09-02 ENCOUNTER — Other Ambulatory Visit: Payer: Self-pay

## 2022-09-02 DIAGNOSIS — R062 Wheezing: Secondary | ICD-10-CM

## 2022-09-02 DIAGNOSIS — C3412 Malignant neoplasm of upper lobe, left bronchus or lung: Secondary | ICD-10-CM

## 2022-09-02 MED ORDER — ALBUTEROL SULFATE HFA 108 (90 BASE) MCG/ACT IN AERS: 2.0000 | INHALATION_SPRAY | Freq: Four times a day (QID) | RESPIRATORY_TRACT | 2 refills | Status: DC | PRN

## 2022-09-09 ENCOUNTER — Telehealth: Payer: Self-pay

## 2022-09-09 NOTE — Telephone Encounter (Signed)
Pt called. he fell out of hammock yesterday (in his carport). He is pretty sore this morning and reports under right breast. Pt is going to Urgent Care for evaluation. Above message sent to Dr Bobby Rumpf and Verner Chol @ [phone].

## 2022-09-11 ENCOUNTER — Other Ambulatory Visit: Payer: Self-pay | Admitting: Oncology

## 2022-09-11 ENCOUNTER — Encounter: Payer: Self-pay | Admitting: Hematology and Oncology

## 2022-09-14 ENCOUNTER — Telehealth: Payer: Self-pay | Admitting: *Deleted

## 2022-09-14 NOTE — Telephone Encounter (Signed)
Per Dr Mickeal Skinner:  4mg  daily x5 days, then decrease to 2mg  until scan   Spoke with patients wife: she stated understanding.

## 2022-09-14 NOTE — Telephone Encounter (Signed)
Wife called to report that patient fell on 10/2 and hit his head and side while in the carport  CT of Chest showed minor bruising.  Last week he was sitting on the toilet and kept falling to one side and hitting the wall (unable to maintain trunk support).    Saturday he had difficulty holding a bottle of water upright and kept losing strength in the left hand and would pour out the entire bottle.  Patient also was having difficulty walking and speech was slower.    Wife states he normally takes Decadron 1 mg but Sunday she gave him 2 mg and has since seen some improvement in his level of function.  She was calling to question if she should continue with Decadron 2 mg until he is seen which will be after his MRI at the end of October.  Routed to Dr Mickeal Skinner.

## 2022-09-17 ENCOUNTER — Telehealth: Payer: Self-pay

## 2022-09-17 NOTE — Telephone Encounter (Signed)
        Patient  visited [facility] on 10/7  Telephone encounter attempt :    1st A HIPAA compliant voice message was left requesting a return call.  Instructed patient to call back   .  [facility], Care Management  [phone] [address], [place], [place] [address] Phone: [phone] Email: [email or web address]

## 2022-09-24 ENCOUNTER — Other Ambulatory Visit: Payer: Self-pay | Admitting: Internal Medicine

## 2022-09-24 DIAGNOSIS — C3412 Malignant neoplasm of upper lobe, left bronchus or lung: Secondary | ICD-10-CM

## 2022-09-27 ENCOUNTER — Other Ambulatory Visit: Payer: Self-pay | Admitting: Oncology

## 2022-09-29 ENCOUNTER — Other Ambulatory Visit: Payer: Self-pay | Admitting: Radiation Therapy

## 2022-10-02 ENCOUNTER — Ambulatory Visit
Admission: RE | Admit: 2022-10-02 | Discharge: 2022-10-02 | Disposition: A | Discharge status: Home / Self Care | Payer: No Typology Code available for payment source | Source: Ambulatory Visit | Attending: Internal Medicine | Admitting: Internal Medicine

## 2022-10-02 DIAGNOSIS — C7931 Secondary malignant neoplasm of brain: Secondary | ICD-10-CM

## 2022-10-02 MED ORDER — GADOPICLENOL 0.5 MMOL/ML IV SOLN
10.0000 mL | Freq: Once | INTRAVENOUS | Status: AC | PRN
  Administered 2022-10-02: 10 mL via INTRAVENOUS

## 2022-10-05 ENCOUNTER — Inpatient Hospital Stay: Payer: No Typology Code available for payment source

## 2022-10-05 DIAGNOSIS — R600 Localized edema: Secondary | ICD-10-CM | POA: Insufficient documentation

## 2022-10-05 DIAGNOSIS — C7931 Secondary malignant neoplasm of brain: Secondary | ICD-10-CM | POA: Insufficient documentation

## 2022-10-05 DIAGNOSIS — Z79899 Other long term (current) drug therapy: Secondary | ICD-10-CM | POA: Insufficient documentation

## 2022-10-05 DIAGNOSIS — C3490 Malignant neoplasm of unspecified part of unspecified bronchus or lung: Secondary | ICD-10-CM | POA: Insufficient documentation

## 2022-10-05 DIAGNOSIS — Z87891 Personal history of nicotine dependence: Secondary | ICD-10-CM | POA: Insufficient documentation

## 2022-10-05 DIAGNOSIS — Z7952 Long term (current) use of systemic steroids: Secondary | ICD-10-CM | POA: Insufficient documentation

## 2022-10-06 ENCOUNTER — Inpatient Hospital Stay: Payer: No Typology Code available for payment source | Attending: Internal Medicine | Admitting: Internal Medicine

## 2022-10-06 ENCOUNTER — Other Ambulatory Visit: Payer: Self-pay

## 2022-10-06 VITALS — BP 161/106

## 2022-10-06 DIAGNOSIS — Z79899 Other long term (current) drug therapy: Secondary | ICD-10-CM | POA: Diagnosis not present

## 2022-10-06 DIAGNOSIS — C3490 Malignant neoplasm of unspecified part of unspecified bronchus or lung: Secondary | ICD-10-CM | POA: Diagnosis present

## 2022-10-06 DIAGNOSIS — C7931 Secondary malignant neoplasm of brain: Secondary | ICD-10-CM | POA: Diagnosis not present

## 2022-10-06 DIAGNOSIS — Z7952 Long term (current) use of systemic steroids: Secondary | ICD-10-CM | POA: Diagnosis not present

## 2022-10-06 DIAGNOSIS — R600 Localized edema: Secondary | ICD-10-CM | POA: Diagnosis not present

## 2022-10-06 DIAGNOSIS — Z87891 Personal history of nicotine dependence: Secondary | ICD-10-CM | POA: Diagnosis not present

## 2022-10-06 NOTE — Progress Notes (Signed)
[facility] at [place], [place] [address] [phone]   Interval Evaluation  Date of Service: 10/06/22 Patient Name: Alex Cordova Patient MRN: [id number] Patient DOB: 10-12-1946 Provider: Ventura Sellers, MD  Identifying Statement:  Alex Cordova is a 76 y.o. male with Malignant neoplasm metastatic to brain [facility]) [C79.31]   Primary Cancer: [facility] Lung Stage IV  Oncologic History: 04/18/20: Pre-op SRS R temporal 14 Gy 04/19/20: Craniotomy, resection by Dr. Saintclair Halsted 07/30/20: Salvage SRS R subcortex 09/26/21: Salvage SRS 1.5cm R frontal met  Interval History: Alex Cordova presents today for follow up after recent MRI brain.  His left sided weakness, described 2 weeks prior, has improved essentially back to prior baseline since starting the decadron.  Swelling in hands, feet, face and belly are significantly worse, however.  Still complains of fatigue, lethargy as prior.  Decadron is still at 81m daily after several days on 464m  No further seizures, currently dosing Keppra 1000/500.  Back on Capmatinib but just once daily now with Dr. LeBobby Rumpfn [facility] (07/23/20) Patient presents to clinic today for follow up after 3 months post-SRS/craniotomy MRI scan.  He describes no new or progressive neurologic deficits.  No headaches or seizures.  He no longer takes Keppra and he has recently completed tapering off all corticosteroids.  He remains relatively active around the home, independent with gait and activities of daily living.  Currently on observation and serial imaging monitoring with Dr. LeBobby Rumpfor lung cancer.   Medications: Current Outpatient Medications on File Prior to Visit  Medication Sig Dispense Refill   acetaminophen (TYLENOL) 325 MG tablet Take 650 mg by mouth every 6 (six) hours as needed.     albuterol (VENTOLIN HFA) 108 (90 Base) MCG/ACT inhaler Inhale 2 puffs into the lungs every 6 (six) hours as  needed for wheezing or shortness of breath. 8 g 2   Alogliptin Benzoate 25 MG TABS TAKE ONE-HALF TABLET BY MOUTH DAILY FOR DIABETES     Calcium Carb-Cholecalciferol 600-10 MG-MCG TABS TAKE 1 TABLET BY MOUTH EVERY MORNING AFTER BREAKFAST     capmatinib (TABRECTA) 200 MG tablet Take 2 tablets (400 mg total) by mouth 2 (two) times daily. 120 tablet 3   cetirizine (ZYRTEC) 10 MG tablet Take 1 tablet by mouth daily.     dexamethasone (DECADRON) 2 MG tablet Take 1 tablet (2 mg total) by mouth daily. 60 tablet 1   esomeprazole (NEXIUM) 40 MG capsule Take 40 mg by mouth daily at 12 noon.     furosemide (LASIX) 20 MG tablet TAKE AS NEEDED FOR SWELLING 90 tablet 1   furosemide (LASIX) 20 MG tablet 1-2 TABS DAILY TAKE AS NEEDED FOR SWELLING 180 tablet 1   Hypertonic Nasal Wash (SINUS RINSE BOTTLE KIT NA) 2 (two) times daily.     ibuprofen (ADVIL) 200 MG tablet Take 200 mg by mouth every 6 (six) hours as needed.     ipratropium (ATROVENT) 0.03 % nasal spray SPRAY 2 SPRAYS INTO EACH NOSTRIL EVERY 8 TO 12 HOURS     levETIRAcetam (KEPPRA) 750 MG tablet TAKE 1 TABLET BY MOUTH IN MORNING, 1 TAB MID DAY AND 2 TABS AT BEDTIME. 360 tablet 1   LORazepam (ATIVAN) 0.5 MG tablet One tab po q 6-8 hrs prn anxiety/seizures 30 tablet 1   potassium chloride SA (KLOR-CON M) 20 MEQ tablet TAKE ONE TABLET BY MOUTH DAILY (TAKE WITH FOOD)     pravastatin (PRAVACHOL)  40 MG tablet TAKE ONE TABLET BY MOUTH AT BEDTIME FOR CHOLESTEROL     Psyllium (METAMUCIL PO) Take 1 Scoop by mouth daily.     traMADol (ULTRAM) 50 MG tablet Take 1 tablet (50 mg total) by mouth every 6 (six) hours as needed. 30 tablet 0   Current Facility-Administered Medications on File Prior to Visit  Medication Dose Route Frequency Provider Last Rate Last Admin   sodium chloride flush (NS) 0.9 % injection 10 mL  10 mL Intracatheter PRN Mosher, Kelli A, PA-C   10 mL at 09/08/21 0900    Allergies:  Allergies  Allergen Reactions   Metformin Diarrhea   Misc.  Sulfonamide Containing Compounds    Other Swelling    Tongue swelling   Rosuvastatin     Other reaction(s): Myositis   Sulfamethoxazole Nausea Only    Childhood allergy   Past Medical History:  Past Medical History:  Diagnosis Date   Arthritis    Diabetes mellitus type 2 in obese (HCC)    Full dentures    GERD (gastroesophageal reflux disease)    Hyperlipidemia    Lung cancer ([facility])    Metastatic cancer to brain ([facility])    Pneumonia    Wears glasses    Past Surgical History:  Past Surgical History:  Procedure Laterality Date   APPLICATION OF CRANIAL NAVIGATION N/A 04/19/2020   Procedure: APPLICATION OF CRANIAL NAVIGATION;  Surgeon: Kary Kos, MD;  Location: [facility];  Service: Neurosurgery;  Laterality: N/A;  APPLICATION OF CRANIAL NAVIGATION   COLONOSCOPY W/ BIOPSIES AND POLYPECTOMY     CRANIOTOMY Right 04/19/2020   Procedure: Right Temporal stereotactic craniotomy;  Surgeon: Kary Kos, MD;  Location: [facility];  Service: Neurosurgery;  Laterality: Right;   FRACTURE SURGERY     right shoulder   MULTIPLE TOOTH EXTRACTIONS     Social History:  Social History   Socioeconomic History   Marital status: Married    Spouse name: Not on file   Number of children: Not on file   Years of education: Not on file   Highest education level: Not on file  Occupational History   Not on file  Tobacco Use   Smoking status: Former    Types: Cigarettes   Smokeless tobacco: Never   Tobacco comments:    quit smoking in 1972  Vaping Use   Vaping Use: Never used  Substance and Sexual Activity   Alcohol use: Not Currently    Comment: not since 1972   Drug use: Never   Sexual activity: Not on file  Other Topics Concern   Not on file  Social History Narrative   Not on file   Social Determinants of Health   Financial Resource Strain: Not on file  Food Insecurity: Not on file  Transportation Needs: Not on file  Physical Activity: Not on file  Stress: Not on file  Social Connections: Not  on file  Intimate Partner Violence: Not on file   Family History: No family history on file.  Review of Systems: Constitutional: Doesn't report fevers, chills or abnormal weight loss Eyes: Doesn't report blurriness of vision Ears, nose, mouth, throat, and face: Doesn't report sore throat Respiratory: Doesn't report cough, dyspnea or wheezes Cardiovascular: Doesn't report palpitation, chest discomfort  Gastrointestinal:  Doesn't report nausea, constipation, diarrhea GU: Doesn't report incontinence Skin: Doesn't report skin rashes Neurological: Per HPI Musculoskeletal: Doesn't report joint pain Behavioral/Psych: Doesn't report anxiety  Physical Exam: Vitals:   10/06/22 0958 10/06/22 1002  BP: Marland Kitchen)  160/90 (!) 161/106    KPS: 80. General: Alert, cooperative, pleasant, in no acute distress Head: Normal EENT: No conjunctival injection or scleral icterus.  Lungs: Resp effort normal Cardiac: Regular rate Abdomen: Non-distended abdomen Skin: No rashes cyanosis or petechiae. Extremities: No clubbing or edema  Neurologic Exam: Mental Status: Awake, alert, attentive to examiner. Oriented to self and environment. Language is fluent with intact comprehension.  Cranial Nerves: Visual acuity is grossly normal. Visual fields are full. Extra-ocular movements intact. No ptosis. Face symmetric Motor: Tone and bulk are normal. Mild drift in left arm. Reflexes are symmetric, no pathologic reflexes present.  Sensory: Intact to light touch Gait: Dystaxic   Labs: I have reviewed the data as listed    Component Value Date/Time   NA 136 (A) 08/17/2022 0000   K 3.9 08/17/2022 0000   CL 103 08/17/2022 0000   CO2 27 (A) 08/17/2022 0000   GLUCOSE 133 (H) 10/08/2020 0919   BUN 15 08/17/2022 0000   CREATININE 1.0 08/17/2022 0000   CREATININE 1.38 (H) 10/08/2020 0919   CALCIUM 8.7 08/17/2022 0000   PROT 5.6 (A) 12/24/2021 0000   ALBUMIN 3.3 (A) 08/17/2022 0000   AST 24 08/17/2022 0000   AST 23  10/08/2020 0919   ALT 28 08/17/2022 0000   ALT 26 10/08/2020 0919   ALKPHOS 50 08/17/2022 0000   BILITOT 0.7 10/08/2020 0919   GFRNONAA 53 (L) 10/08/2020 0919   GFRAA >60 07/23/2020 1253   Lab Results  Component Value Date   WBC 8.8 08/17/2022   NEUTROABS 5.98 08/17/2022   HGB 15.1 08/17/2022   HCT 45 08/17/2022   MCV 88 12/24/2021   PLT 158 08/17/2022   Imaging:  [facility] Clinician Interpretation: I have personally reviewed the CNS images as listed.  My interpretation, in the context of the patient's clinical presentation, is treatment effect vs true progression  MR BRAIN W WO CONTRAST  Result Date: 10/04/2022 CLINICAL DATA:  Follow-up brain metastases EXAM: MRI HEAD WITHOUT AND WITH CONTRAST TECHNIQUE: Multiplanar, multiecho pulse sequences of the brain and surrounding structures were obtained without and with intravenous contrast. CONTRAST:  10 cc [facility] COMPARISON:  Brain MRI 07/03/2022 FINDINGS: Brain: The peripherally enhancing lesion in the right parietal lobe is increased in size now measuring 1.5 cm x 1.3 cm in the coronal plane, previously measured 1.2 cm x 1.0 cm. DWI signal abnormality at the periphery of the lesion is similar. A small amount of blood products are noted within this lesion. Edema surrounding this lesion has increased in the high right parietal lobe and centrum semiovale (10-51, 10-46). The larger peripherally enhancing lesion in the right frontal lobe posteriorly is not significantly changed in size, measuring 2.7 cm x 1.9 cm in the coronal plane. FLAIR signal abnormality extending into the posterior limb of the internal capsule has slightly increased (10-32). Additional extensive FLAIR signal abnormality throughout much of the right frontal and parietal lobe white matter extending to the external capsule and temporal lobe is overall similar. SWI signal dropout within this lesion is similar. DWI signal abnormality at the periphery of this lesion is similar to the prior  study. There is no significant mass effect. Postsurgical changes reflecting right temporal craniotomy are again seen with similar underlying encephalomalacia and gliosis, and ill-defined extra-axial enhancement (14-70). There is no definite abnormal parenchymal enhancement. There is no acute intracranial hemorrhage, extra-axial fluid collection, or acute infarct. Parenchymal volume is stable. The ventricles are stable in size. Background chronic small-vessel ischemic change and  small remote lacunar infarct in the left frontal lobe white matter are stable. There is no midline shift. Vascular: Normal flow voids. Skull and upper cervical spine: Postsurgical changes as above. No suspicious marrow signal abnormality. Sinuses/Orbits: The paranasal sinuses are clear. The globes and orbits are unremarkable. Other: None. IMPRESSION: 1. Increased size of the peripherally enhancing lesion in the right parietal lobe with increased surrounding FLAIR signal abnormality. 2. Stable size of the larger peripherally enhancing lesion in the right frontal lobe but with slightly increased FLAIR signal abnormality extending into the posterior limb of the internal capsule. Additional extensive FLAIR signal abnormality is overall not significantly changed. Electronically Signed   By: Valetta Mole M.D.   On: 10/04/2022 18:15    Assessment/Plan Malignant neoplasm metastatic to brain Alex Cordova) [C79.31]  Alex Cordova is clinically improved today after increased decadron dosing.  MRI brain actually demonstrates stability of larger right frontal region, previously progressive.  The smaller right parietal lesion, treated 24 months ago, continues to progress steadily over the past 1+ year.  Etiology of the smaller lesion is more suspicious for recurrent tumor, but radionecrosis is also on differential.  We discussed various options moving forward, including increased decadron, biopsy, [facility] referral, avastin trial.  He is intersted in  trying avastin, which would help treat inflammation, control symptoms, and allow draw-down of corticosteroids given the side effects.  He understands this would not likely address recurrent tumor.  We would recommend 29m/kg IV q2 weeks x3 cycles, then obtaining follow up MRI study for review.  We reviewed side effects of avastin including hypertension, bleeding/clotting, wound healing impairment.  He would prefer to undergo treatments in [place] with Dr. LBobby Rumpf    We reviewed recommendations with Dr. LBobby Rumpf he is agreeable to administer avastin locally.   Avastin should be held for the following:  ANC less than 500  Platelets less than 50,000  LFT or creatinine greater than 2x ULN  If clinical concerns/contraindications develop     Keppra should con't 1500/750.  Decadron should stay at 233mdaily, then decrease to 29m3maily x5 days once avastin is started, then discontinued.  If left sided weakness or seizures recur will consider increasing back to 4mg37mWe appreciate the opportunity to participate in the care of KennIssai Cordova We ask that KennDoree Barthelurn to clinic in ~6 weeks following post-avastin brain MRI, or sooner as needed.    All questions were answered. The patient knows to call the clinic with any problems, questions or concerns. No barriers to learning were detected.  I have spent a total of 40 minutes of face-to-face and non-face-to-face time, excluding clinical staff time, preparing to see patient, ordering tests and/or medications, counseling the patient, and independently interpreting results and communicating results to the patient/family/caregiver    ZachVentura Sellers Medical Director of Neuro-Oncology [facility] HospitalWesl[facility]velo31/23 9:42 AM

## 2022-10-08 ENCOUNTER — Other Ambulatory Visit: Payer: Self-pay | Admitting: Radiation Therapy

## 2022-10-16 ENCOUNTER — Other Ambulatory Visit: Payer: Self-pay

## 2022-10-16 DIAGNOSIS — C7931 Secondary malignant neoplasm of brain: Secondary | ICD-10-CM

## 2022-10-18 NOTE — Progress Notes (Unsigned)
[facility]  [address] [place],  [place]  [address] [phone]  Clinic Day:  10/19/2022  Referring physician: Landry Mellow, MD  HISTORY OF PRESENT ILLNESS:  The patient is a 76 y.o. male with metastatic non-small cell lung cancer, which includes brain metastasis and a metastatic focus of disease to his left acromion process.  He continues to take capmatinib, but at 200 mg daily.  Of note, recent CT scans of his chest/abdomen/pelvis did not show evidence of disease progression.  However, a recent brain MRI did show an enlarging lesion in his right parietal lobe worrisome for CNS disease progression.  He comes in today to discuss using Avastin for better local CNS disease control.  Despite having this enlarging lesion, the gentleman denies having any severe neurological symptoms.  He continues to function well on a daily basis without any significant limitations.  With respect to his lung cancer history, his initial lung cancer pathology showed squamous cell carcinoma in February 2019.   He initially received carboplatin/paclitaxel/pembrolizumab for his disease management before being transitioned to maintenance pembrolizumab.   He stayed on this agent for numerous months, which helped with his disease control.  However, pathology from his metastatic brain resection from a craniotomy in May 2021 showed adenocarcinoma.  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   He has also undergone stereotactic radiation in [place] to treat his primary left lung mass.  He has also undergone salvage SRS to his brain lesions in August 2021 and October 2021.    PHYSICAL EXAM:  Blood pressure (!) 177/115, pulse 93, temperature 97.6 F (36.4 C), resp. rate 16, height 5' 10.5" (1.791 m), weight 260 lb (117.9 kg), SpO2 90 %. Wt Readings from Last 3 Encounters:   10/19/22 260 lb (117.9 kg)  08/18/22 265 lb 3.2 oz (120.3 kg)  07/07/22 259 lb 1.6 oz (117.5 kg)   Body mass index is 36.78 kg/m. Performance status: 1 Physical Exam Constitutional:      Appearance: Normal appearance. He is not ill-appearing.  HENT:     Mouth/Throat:     Mouth: Mucous membranes are moist.     Pharynx: Oropharynx is clear. No oropharyngeal exudate or posterior oropharyngeal erythema.  Cardiovascular:     Rate and Rhythm: Normal rate and regular rhythm.     Heart sounds: No murmur heard.    No friction rub. No gallop.  Pulmonary:     Effort: Pulmonary effort is normal. No respiratory distress.     Breath sounds: Normal breath sounds. No wheezing, rhonchi or rales.  Abdominal:     General: Bowel sounds are normal. There is no distension.     Palpations: Abdomen is soft. There is no mass.     Tenderness: There is no abdominal tenderness.  Musculoskeletal:        General: No swelling.     Right lower leg: No edema.     Left lower leg: No edema.  Lymphadenopathy:     Cervical: No cervical adenopathy.     Upper Body:     Right upper body: No supraclavicular or axillary adenopathy.     Left upper body: No supraclavicular or axillary adenopathy.     Lower Body: No right inguinal adenopathy. No left inguinal adenopathy.  Skin:    General: Skin is warm.     Coloration: Skin is not jaundiced.  Findings: No lesion or rash.  Neurological:     General: No focal deficit present.     Mental Status: He is alert and oriented to person, place, and time. Mental status is at baseline.     Coordination: Coordination abnormal (Mild dysdiadochokinesis seen with his left hand).  Psychiatric:        Mood and Affect: Mood normal.        Behavior: Behavior normal.        Thought Content: Thought content normal.    SCANS:  His recent brain MRI revealed the following: FINDINGS: Brain: The peripherally enhancing lesion in the right parietal lobe is increased in size now  measuring 1.5 cm x 1.3 cm in the coronal plane, previously measured 1.2 cm x 1.0 cm. DWI signal abnormality at the periphery of the lesion is similar. A small amount of blood products are noted within this lesion. Edema surrounding this lesion has increased in the high right parietal lobe and centrum semiovale (10-51, 10-46).   The larger peripherally enhancing lesion in the right frontal lobe posteriorly is not significantly changed in size, measuring 2.7 cm x 1.9 cm in the coronal plane. FLAIR signal abnormality extending into the posterior limb of the internal capsule has slightly increased (10-32). Additional extensive FLAIR signal abnormality throughout much of the right frontal and parietal lobe white matter extending to the external capsule and temporal lobe is overall similar. SWI signal dropout within this lesion is similar. DWI signal abnormality at the periphery of this lesion is similar to the prior study. There is no significant mass effect.   Postsurgical changes reflecting right temporal craniotomy are again seen with similar underlying encephalomalacia and gliosis, and ill-defined extra-axial enhancement (14-70). There is no definite abnormal parenchymal enhancement.   There is no acute intracranial hemorrhage, extra-axial fluid collection, or acute infarct.   Parenchymal volume is stable. The ventricles are stable in size.  Heart rate Background chronic small-vessel ischemic change and small remote lacunar infarct in the left frontal lobe white matter are stable.   There is no midline shift.   Vascular: Normal flow voids.   Skull and upper cervical spine: Postsurgical changes as above. No suspicious marrow signal abnormality.   Sinuses/Orbits: The paranasal sinuses are clear. The globes and orbits are unremarkable.   Other: None.   IMPRESSION: 1. Increased size of the peripherally enhancing lesion in the right parietal lobe with increased surrounding  FLAIR signal abnormality. 2. Stable size of the larger peripherally enhancing lesion in the right frontal lobe but with slightly increased FLAIR signal abnormality extending into the posterior limb of the internal capsule. Additional extensive FLAIR signal abnormality is overall not significantly changed.  ASSESSMENT & PLAN:  Assessment/Plan:  A 76 y.o. 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  As mentioned previously, a recent brain MRI did show his right parietal lobe lesion getting larger in size.  Based upon this, I will start this gentleman on Avastin 10 mg/kg body weight.  His first dose of Avastin will be given on Friday, November 17th.  The patient was made aware of the side effects which go along with this agent, including hypertension and proteinuria.  The plan is for him to receive 3 total cycles of Avastin, with Dr. Mickeal Skinner rescanning his brain after these treatments are done.  I will see him back in 2 weeks before he is due for his second cycle of Avastin. The patient understands all  the plans discussed today and is in agreement with them.  Opel Lejeune Macarthur Critchley, MD

## 2022-10-19 ENCOUNTER — Inpatient Hospital Stay: Payer: No Typology Code available for payment source

## 2022-10-19 ENCOUNTER — Other Ambulatory Visit: Payer: Self-pay | Admitting: Oncology

## 2022-10-19 ENCOUNTER — Inpatient Hospital Stay: Payer: No Typology Code available for payment source | Attending: Internal Medicine | Admitting: Oncology

## 2022-10-19 VITALS — BP 177/115 | HR 93 | Temp 97.6°F | Resp 16 | Ht 70.5 in | Wt 260.0 lb

## 2022-10-19 DIAGNOSIS — C3490 Malignant neoplasm of unspecified part of unspecified bronchus or lung: Secondary | ICD-10-CM | POA: Diagnosis present

## 2022-10-19 DIAGNOSIS — Z5112 Encounter for antineoplastic immunotherapy: Secondary | ICD-10-CM | POA: Insufficient documentation

## 2022-10-19 DIAGNOSIS — C7931 Secondary malignant neoplasm of brain: Secondary | ICD-10-CM | POA: Insufficient documentation

## 2022-10-19 DIAGNOSIS — I10 Essential (primary) hypertension: Secondary | ICD-10-CM | POA: Insufficient documentation

## 2022-10-19 DIAGNOSIS — C7951 Secondary malignant neoplasm of bone: Secondary | ICD-10-CM | POA: Diagnosis not present

## 2022-10-19 DIAGNOSIS — R809 Proteinuria, unspecified: Secondary | ICD-10-CM | POA: Diagnosis not present

## 2022-10-19 LAB — CMP (CANCER CENTER ONLY)
ALT: 26 U/L (ref 0–44)
AST: 23 U/L (ref 15–41)
Albumin: 3.3 g/dL — ABNORMAL LOW (ref 3.5–5.0)
Alkaline Phosphatase: 51 U/L (ref 38–126)
Anion gap: 13 (ref 5–15)
BUN: 22 mg/dL (ref 8–23)
CO2: 30 mmol/L (ref 22–32)
Calcium: 9.2 mg/dL (ref 8.9–10.3)
Chloride: 101 mmol/L (ref 98–111)
Creatinine: 1.13 mg/dL (ref 0.61–1.24)
GFR, Estimated: >60 mL/min (ref >60)
Glucose, Bld: 133 mg/dL — ABNORMAL HIGH (ref 70–99)
Potassium: 3.7 mmol/L (ref 3.5–5.1)
Sodium: 144 mmol/L (ref 135–145)
Total Bilirubin: 0.9 mg/dL (ref 0.3–1.2)
Total Protein: 6.1 g/dL — ABNORMAL LOW (ref 6.5–8.1)

## 2022-10-20 ENCOUNTER — Other Ambulatory Visit: Payer: Self-pay

## 2022-10-21 ENCOUNTER — Other Ambulatory Visit: Payer: Self-pay

## 2022-10-21 ENCOUNTER — Other Ambulatory Visit: Payer: Self-pay | Admitting: Internal Medicine

## 2022-10-21 ENCOUNTER — Other Ambulatory Visit: Payer: Self-pay | Admitting: Oncology

## 2022-10-22 ENCOUNTER — Other Ambulatory Visit: Payer: Self-pay | Admitting: Oncology

## 2022-10-22 ENCOUNTER — Other Ambulatory Visit: Payer: Self-pay

## 2022-10-22 MED ORDER — TRAMADOL HCL 50 MG PO TABS: 50.0000 mg | ORAL_TABLET | Freq: Four times a day (QID) | ORAL | 0 refills | Status: DC | PRN

## 2022-10-22 MED FILL — Bevacizumab-awwb IV Soln 400 MG/16ML (For Infusion): INTRAVENOUS | Qty: 48 | Status: AC

## 2022-10-23 ENCOUNTER — Inpatient Hospital Stay: Payer: No Typology Code available for payment source

## 2022-10-23 DIAGNOSIS — Z5112 Encounter for antineoplastic immunotherapy: Secondary | ICD-10-CM | POA: Diagnosis not present

## 2022-10-23 DIAGNOSIS — C7931 Secondary malignant neoplasm of brain: Secondary | ICD-10-CM

## 2022-10-23 MED ORDER — HEPARIN SOD (PORK) LOCK FLUSH 100 UNIT/ML IV SOLN
500.0000 [IU] | Freq: Once | INTRAVENOUS | Status: AC | PRN
  Administered 2022-10-23: 500 [IU]

## 2022-10-23 MED ORDER — SODIUM CHLORIDE 0.9% FLUSH
10.0000 mL | INTRAVENOUS | Status: DC | PRN
  Administered 2022-10-23 (×2): 10 mL

## 2022-10-23 MED ORDER — SODIUM CHLORIDE 0.9 % IV SOLN
10.0000 mg/kg | Freq: Once | INTRAVENOUS | Status: AC
  Administered 2022-10-23: 1200 mg via INTRAVENOUS
  Filled 2022-10-23: qty 48

## 2022-10-23 MED ORDER — SODIUM CHLORIDE 0.9 % IV SOLN: Freq: Once | INTRAVENOUS | Status: AC

## 2022-10-23 NOTE — Patient Instructions (Signed)
Bevacizumab Injection What is this medication? BEVACIZUMAB (be va SIZ yoo mab) treats some types of cancer. It works by blocking a protein that causes cancer cells to grow and multiply. This helps to slow or stop the spread of cancer cells. It is a monoclonal antibody. This medicine may be used for other purposes; ask your health care provider or pharmacist if you have questions. COMMON BRAND NAME(S): Alymsys, Avastin, MVASI, Noah Charon What should I tell my care team before I take this medication? They need to know if you have any of these conditions: Blood clots Coughing up blood Having or recent surgery Heart failure High blood pressure History of a connection between 2 or more body parts that do not usually connect (fistula) History of a tear in your stomach or intestines Protein in your urine An unusual or allergic reaction to bevacizumab, other medications, foods, dyes, or preservatives Pregnant or trying to get pregnant Breast-feeding How should I use this medication? This medication is injected into a vein. It is given by your care team in a hospital or clinic setting. Talk to your care team the use of this medication in children. Special care may be needed. Overdosage: If you think you have taken too much of this medicine contact a poison control center or emergency room at once. NOTE: This medicine is only for you. Do not share this medicine with others. What if I miss a dose? Keep appointments for follow-up doses. It is important not to miss your dose. Call your care team if you are unable to keep an appointment. What may interact with this medication? Interactions are not expected. This list may not describe all possible interactions. Give your health care provider a list of all the medicines, herbs, non-prescription drugs, or dietary supplements you use. Also tell them if you smoke, drink alcohol, or use illegal drugs. Some items may interact with your medicine. What should I  watch for while using this medication? Your condition will be monitored carefully while you are receiving this medication. You may need blood work while taking this medication. This medication may make you feel generally unwell. This is not uncommon as chemotherapy can affect healthy cells as well as cancer cells. Report any side effects. Continue your course of treatment even though you feel ill unless your care team tells you to stop. This medication may increase your risk to bruise or bleed. Call your care team if you notice any unusual bleeding. Before having surgery, talk to your care team to make sure it is ok. This medication can increase the risk of poor healing of your surgical site or wound. You will need to stop this medication for 28 days before surgery. After surgery, wait at least 28 days before restarting this medication. Make sure the surgical site or wound is healed enough before restarting this medication. Talk to your care team if questions. Talk to your care team if you may be pregnant. Serious birth defects can occur if you take this medication during pregnancy and for 6 months after the last dose. Contraception is recommended while taking this medication and for 6 months after the last dose. Your care team can help you find the option that works for you. Do not breastfeed while taking this medication and for 6 months after the last dose. This medication can cause infertility. Talk to your care team if you are concerned about your fertility. What side effects may I notice from receiving this medication? Side effects that you should report  to your care team as soon as possible: Allergic reactions--skin rash, itching, hives, swelling of the face, lips, tongue, or throat Bleeding--bloody or black, tar-like stools, vomiting blood or brown material that looks like coffee grounds, red or dark brown urine, small red or purple spots on skin, unusual bruising or bleeding Blood clot--pain,  swelling, or warmth in the leg, shortness of breath, chest pain Heart attack--pain or tightness in the chest, shoulders, arms, or jaw, nausea, shortness of breath, cold or clammy skin, feeling faint or lightheaded Heart failure--shortness of breath, swelling of the ankles, feet, or hands, sudden weight gain, unusual weakness or fatigue Increase in blood pressure Infection--fever, chills, cough, sore throat, wounds that don't heal, pain or trouble when passing urine, general feeling of discomfort or being unwell Infusion reactions--chest pain, shortness of breath or trouble breathing, feeling faint or lightheaded Kidney injury--decrease in the amount of urine, swelling of the ankles, hands, or feet Stomach pain that is severe, does not go away, or gets worse Stroke--sudden numbness or weakness of the face, arm, or leg, trouble speaking, confusion, trouble walking, loss of balance or coordination, dizziness, severe headache, change in vision Sudden and severe headache, confusion, change in vision, seizures, which may be signs of posterior reversible encephalopathy syndrome (PRES) Side effects that usually do not require medical attention (report to your care team if they continue or are bothersome): Back pain Change in taste Diarrhea Dry skin Increased tears Nosebleed This list may not describe all possible side effects. Call your doctor for medical advice about side effects. You may report side effects to FDA at 1-800-FDA-1088. Where should I keep my medication? This medication is given in a hospital or clinic. It will not be stored at home. NOTE: This sheet is a summary. It may not cover all possible information. If you have questions about this medicine, talk to your doctor, pharmacist, or health care provider.  2023 Elsevier/Gold Standard (2022-03-27 00:00:00)

## 2022-10-24 ENCOUNTER — Other Ambulatory Visit: Payer: Self-pay

## 2022-10-26 ENCOUNTER — Telehealth: Payer: Self-pay

## 2022-10-26 NOTE — Telephone Encounter (Signed)
I called and spoke with pt. He states that right after his infusion, he felt more SOB. The SOB got better over the weekend. He denies N/V, mouth sores, chest pain, fever, and diarrhea.  Pt alert and oriented x 3 today. Pt instructed to call us if he develops temp, or has questions/concerns. I confirmed next appt, which he knew (correct time and day).

## 2022-11-02 NOTE — Progress Notes (Signed)
[facility]  [address] [place],  [place]  [address] [phone]  Clinic Day:  11/03/2022  Referring physician: Landry Mellow, MD  HISTORY OF PRESENT ILLNESS:  The patient is a 76 y.o. male with metastatic non-small cell lung cancer, which includes brain metastasis and a metastatic focus of disease to his left acromion process.  He continues to take capmatinib, but at 200 mg daily.  Of note, recent CT scans of his chest/abdomen/pelvis did not show evidence of disease progression.  However, a recent brain MRI did show an enlarging lesion in his right parietal lobe worrisome for CNS disease progression.  Based upon this, the patient is receiving Avastin.  He comes in today to be evaluated before heading into his second cycle of Avastin.  The patient complains of being more short of breath than he has been in quite some time.  He claims this started a few days after he received his first dose of Avastin.  He says walking short distances causes him to be more short of breath than he has previously been.  He denies having a productive cough or any fevers to suggest pneumonia is present.    With respect to his lung cancer history, his initial lung cancer pathology showed squamous cell carcinoma in February 2019.   He initially received carboplatin/paclitaxel/pembrolizumab for his disease management before being transitioned to maintenance pembrolizumab.   He stayed on this agent for numerous months, which helped with his disease control.  However, pathology from his metastatic brain resection from a craniotomy in May 2021 showed adenocarcinoma.  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   He has also undergone stereotactic radiation in [place] to treat his primary left lung mass.  He has also undergone salvage SRS to his brain lesions in August 2021 and  October 2021.    PHYSICAL EXAM:  Blood pressure (!) 142/88, pulse (!) 59, temperature 97.6 F (36.4 C), resp. rate 20, height 5' 10.5" (1.791 m), weight 254 lb 3.2 oz (115.3 kg), SpO2 94 %.  His O2 sat dropped to 88% upon walking. Wt Readings from Last 3 Encounters:  11/03/22 254 lb 3.2 oz (115.3 kg)  10/19/22 260 lb (117.9 kg)  08/18/22 265 lb 3.2 oz (120.3 kg)   Body mass index is 35.96 kg/m. Performance status: 1 Physical Exam Constitutional:      Appearance: Normal appearance. He is not ill-appearing.     Comments: He appears moderately dyspneic  HENT:     Mouth/Throat:     Mouth: Mucous membranes are moist.     Pharynx: Oropharynx is clear. No oropharyngeal exudate or posterior oropharyngeal erythema.  Cardiovascular:     Rate and Rhythm: Normal rate and regular rhythm.     Heart sounds: No murmur heard.    No friction rub. No gallop.  Pulmonary:     Effort: Pulmonary effort is normal. No respiratory distress.     Breath sounds: Normal breath sounds. No wheezing, rhonchi or rales.  Abdominal:     General: Bowel sounds are normal. There is no distension.     Palpations: Abdomen is soft. There is no mass.     Tenderness: There is no abdominal tenderness.  Musculoskeletal:        General: No swelling.     Right lower leg: No edema.     Left lower leg: No edema.  Lymphadenopathy:  Cervical: No cervical adenopathy.     Upper Body:     Right upper body: No supraclavicular or axillary adenopathy.     Left upper body: No supraclavicular or axillary adenopathy.     Lower Body: No right inguinal adenopathy. No left inguinal adenopathy.  Skin:    General: Skin is warm.     Coloration: Skin is not jaundiced.     Findings: No lesion or rash.  Neurological:     General: No focal deficit present.     Mental Status: He is alert and oriented to person, place, and time. Mental status is at baseline.     Coordination: Coordination abnormal (Mild dysdiadochokinesis seen with his  left hand).  Psychiatric:        Mood and Affect: Mood normal.        Behavior: Behavior normal.        Thought Content: Thought content normal.    SCANS: The CT angiogram of his chest today revealed the following: FINDINGS: Cardiovascular: Heart size is normal. No right ventricular strain. Coronary artery calcification is present. Aortic atherosclerotic calcification is present. Pulmonary arterial opacification is good. There are bilateral pulmonary emboli.  Mediastinum/Nodes: No mass or lymphadenopathy.  Lungs/Pleura: Scattered areas of pulmonary scarring as seen previously. This is most pronounced in the left perihilar lung. No evidence of changing density. No pleural effusion.  Upper Abdomen: Negative  Musculoskeletal: No bony metastatic disease identified.  Review of the MIP images confirms the above findings.  IMPRESSION: 1. Bilateral pulmonary emboli. No right ventricular strain. 2. Chronic pulmonary scarring. No evidence of changing density. 3. Aortic atherosclerosis. Coronary artery calcification.  Aortic Atherosclerosis (ICD10-I70.0).  ASSESSMENT & PLAN:  Assessment/Plan:  A 76 y.o. 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  Due to his shortness of breath, which was out of character with what he has previously been, I did order a stat CT angiogram today, which showed bilateral pulmonary emboli.  Based upon this finding, I will place the patient on Xarelto 15 mg twice daily x 3 weeks.  Afterwards, he will be switched to 20 mg, for which he will take indefinitely.  Of note, this gentleman had previous blood clots 4 years ago, for which he stayed on Lovenox for approximately 6 months.  The patient understands that he will likely be on Xarelto indefinitely, particularly hen considering he has incurable lung cancer for which he continues to undergo treatment.  He will proceed with his second of 3 planned cycles of  Avastin this week.  I will see him back in 2 weeks before he heads into his third and final cycle of Avastin, which is being used to address his brain mets.  Both the patient and his wife know to contact our office before his next visit if he runs into any problems that require immediate clinical attention.  Burak Zerbe Macarthur Critchley, MD

## 2022-11-03 ENCOUNTER — Inpatient Hospital Stay: Payer: No Typology Code available for payment source

## 2022-11-03 ENCOUNTER — Other Ambulatory Visit: Payer: Self-pay | Admitting: Oncology

## 2022-11-03 ENCOUNTER — Inpatient Hospital Stay (INDEPENDENT_AMBULATORY_CARE_PROVIDER_SITE_OTHER): Payer: No Typology Code available for payment source | Admitting: Oncology

## 2022-11-03 DIAGNOSIS — C7931 Secondary malignant neoplasm of brain: Secondary | ICD-10-CM | POA: Diagnosis not present

## 2022-11-03 DIAGNOSIS — I2699 Other pulmonary embolism without acute cor pulmonale: Secondary | ICD-10-CM | POA: Diagnosis not present

## 2022-11-03 DIAGNOSIS — Z5112 Encounter for antineoplastic immunotherapy: Secondary | ICD-10-CM | POA: Diagnosis not present

## 2022-11-03 DIAGNOSIS — C3412 Malignant neoplasm of upper lobe, left bronchus or lung: Secondary | ICD-10-CM

## 2022-11-03 LAB — TOTAL PROTEIN, URINE DIPSTICK: Protein, ur: 30 mg/dL — AB

## 2022-11-03 LAB — CBC: RBC: 5.44 — AB (ref 3.87–5.11)

## 2022-11-03 LAB — CBC AND DIFFERENTIAL
HCT: 49 (ref 41–53)
Hemoglobin: 16.5 (ref 13.5–17.5)
Neutrophils Absolute: 9.43
Platelets: 110 10*3/uL — AB (ref 150–400)
WBC: 13.1

## 2022-11-03 MED ORDER — RIVAROXABAN (XARELTO) VTE STARTER PACK (15 & 20 MG): ORAL_TABLET | ORAL | 0 refills | Status: DC

## 2022-11-05 ENCOUNTER — Other Ambulatory Visit: Payer: No Typology Code available for payment source

## 2022-11-05 ENCOUNTER — Ambulatory Visit: Payer: No Typology Code available for payment source | Admitting: Oncology

## 2022-11-05 MED FILL — Bevacizumab-awwb IV Soln 400 MG/16ML (For Infusion): INTRAVENOUS | Qty: 48 | Status: AC

## 2022-11-06 ENCOUNTER — Inpatient Hospital Stay: Payer: No Typology Code available for payment source | Attending: Internal Medicine

## 2022-11-06 VITALS — BP 145/66 | HR 83 | Temp 97.7°F | Resp 18 | Ht 70.5 in | Wt 258.0 lb

## 2022-11-06 DIAGNOSIS — C3490 Malignant neoplasm of unspecified part of unspecified bronchus or lung: Secondary | ICD-10-CM | POA: Diagnosis present

## 2022-11-06 DIAGNOSIS — Z5112 Encounter for antineoplastic immunotherapy: Secondary | ICD-10-CM | POA: Insufficient documentation

## 2022-11-06 DIAGNOSIS — Z79899 Other long term (current) drug therapy: Secondary | ICD-10-CM | POA: Diagnosis not present

## 2022-11-06 DIAGNOSIS — C7931 Secondary malignant neoplasm of brain: Secondary | ICD-10-CM | POA: Diagnosis present

## 2022-11-06 DIAGNOSIS — Z7952 Long term (current) use of systemic steroids: Secondary | ICD-10-CM | POA: Diagnosis not present

## 2022-11-06 DIAGNOSIS — Z87891 Personal history of nicotine dependence: Secondary | ICD-10-CM | POA: Insufficient documentation

## 2022-11-06 MED ORDER — SODIUM CHLORIDE 0.9 % IV SOLN
10.0000 mg/kg | Freq: Once | INTRAVENOUS | Status: AC
  Administered 2022-11-06: 1200 mg via INTRAVENOUS
  Filled 2022-11-06: qty 48

## 2022-11-06 MED ORDER — SODIUM CHLORIDE 0.9% FLUSH
10.0000 mL | INTRAVENOUS | Status: DC | PRN
  Administered 2022-11-06: 10 mL

## 2022-11-06 MED ORDER — SODIUM CHLORIDE 0.9 % IV SOLN: Freq: Once | INTRAVENOUS | Status: AC

## 2022-11-06 MED ORDER — HEPARIN SOD (PORK) LOCK FLUSH 100 UNIT/ML IV SOLN
500.0000 [IU] | Freq: Once | INTRAVENOUS | Status: AC | PRN
  Administered 2022-11-06: 500 [IU]

## 2022-11-06 NOTE — Patient Instructions (Signed)
Bevacizumab Injection What is this medication? BEVACIZUMAB (be va SIZ yoo mab) treats some types of cancer. It works by blocking a protein that causes cancer cells to grow and multiply. This helps to slow or stop the spread of cancer cells. It is a monoclonal antibody. This medicine may be used for other purposes; ask your health care provider or pharmacist if you have questions. COMMON BRAND NAME(S): Alymsys, Avastin, MVASI, Noah Charon What should I tell my care team before I take this medication? They need to know if you have any of these conditions: Blood clots Coughing up blood Having or recent surgery Heart failure High blood pressure History of a connection between 2 or more body parts that do not usually connect (fistula) History of a tear in your stomach or intestines Protein in your urine An unusual or allergic reaction to bevacizumab, other medications, foods, dyes, or preservatives Pregnant or trying to get pregnant Breast-feeding How should I use this medication? This medication is injected into a vein. It is given by your care team in a hospital or clinic setting. Talk to your care team the use of this medication in children. Special care may be needed. Overdosage: If you think you have taken too much of this medicine contact a poison control center or emergency room at once. NOTE: This medicine is only for you. Do not share this medicine with others. What if I miss a dose? Keep appointments for follow-up doses. It is important not to miss your dose. Call your care team if you are unable to keep an appointment. What may interact with this medication? Interactions are not expected. This list may not describe all possible interactions. Give your health care provider a list of all the medicines, herbs, non-prescription drugs, or dietary supplements you use. Also tell them if you smoke, drink alcohol, or use illegal drugs. Some items may interact with your medicine. What should I  watch for while using this medication? Your condition will be monitored carefully while you are receiving this medication. You may need blood work while taking this medication. This medication may make you feel generally unwell. This is not uncommon as chemotherapy can affect healthy cells as well as cancer cells. Report any side effects. Continue your course of treatment even though you feel ill unless your care team tells you to stop. This medication may increase your risk to bruise or bleed. Call your care team if you notice any unusual bleeding. Before having surgery, talk to your care team to make sure it is ok. This medication can increase the risk of poor healing of your surgical site or wound. You will need to stop this medication for 28 days before surgery. After surgery, wait at least 28 days before restarting this medication. Make sure the surgical site or wound is healed enough before restarting this medication. Talk to your care team if questions. Talk to your care team if you may be pregnant. Serious birth defects can occur if you take this medication during pregnancy and for 6 months after the last dose. Contraception is recommended while taking this medication and for 6 months after the last dose. Your care team can help you find the option that works for you. Do not breastfeed while taking this medication and for 6 months after the last dose. This medication can cause infertility. Talk to your care team if you are concerned about your fertility. What side effects may I notice from receiving this medication? Side effects that you should report  to your care team as soon as possible: Allergic reactions--skin rash, itching, hives, swelling of the face, lips, tongue, or throat Bleeding--bloody or black, tar-like stools, vomiting blood or brown material that looks like coffee grounds, red or dark brown urine, small red or purple spots on skin, unusual bruising or bleeding Blood clot--pain,  swelling, or warmth in the leg, shortness of breath, chest pain Heart attack--pain or tightness in the chest, shoulders, arms, or jaw, nausea, shortness of breath, cold or clammy skin, feeling faint or lightheaded Heart failure--shortness of breath, swelling of the ankles, feet, or hands, sudden weight gain, unusual weakness or fatigue Increase in blood pressure Infection--fever, chills, cough, sore throat, wounds that don't heal, pain or trouble when passing urine, general feeling of discomfort or being unwell Infusion reactions--chest pain, shortness of breath or trouble breathing, feeling faint or lightheaded Kidney injury--decrease in the amount of urine, swelling of the ankles, hands, or feet Stomach pain that is severe, does not go away, or gets worse Stroke--sudden numbness or weakness of the face, arm, or leg, trouble speaking, confusion, trouble walking, loss of balance or coordination, dizziness, severe headache, change in vision Sudden and severe headache, confusion, change in vision, seizures, which may be signs of posterior reversible encephalopathy syndrome (PRES) Side effects that usually do not require medical attention (report to your care team if they continue or are bothersome): Back pain Change in taste Diarrhea Dry skin Increased tears Nosebleed This list may not describe all possible side effects. Call your doctor for medical advice about side effects. You may report side effects to FDA at 1-800-FDA-1088. Where should I keep my medication? This medication is given in a hospital or clinic. It will not be stored at home. NOTE: This sheet is a summary. It may not cover all possible information. If you have questions about this medicine, talk to your doctor, pharmacist, or health care provider.  2023 Elsevier/Gold Standard (2022-03-27 00:00:00)  [facility]  Discharge Instructions: Thank you for choosing [facility] to provide your  oncology and hematology care.  If you have a lab appointment with the [facility], please go directly to the [facility] and check in at the registration area.   Wear comfortable clothing and clothing appropriate for easy access to any Portacath or PICC line.   We strive to give you quality time with your provider. You may need to reschedule your appointment if you arrive late (15 or more minutes).  Arriving late affects you and other patients whose appointments are after yours.  Also, if you miss three or more appointments without notifying the office, you may be dismissed from the clinic at the provider's discretion.      For prescription refill requests, have your pharmacy contact our office and allow 72 hours for refills to be completed.    Today you received the following chemotherapy and/or immunotherapy agents bevacizumab      To help prevent nausea and vomiting after your treatment, we encourage you to take your nausea medication as directed.  BELOW ARE SYMPTOMS THAT SHOULD BE REPORTED IMMEDIATELY: *FEVER GREATER THAN 100.4 F (38 C) OR HIGHER *CHILLS OR SWEATING *NAUSEA AND VOMITING THAT IS NOT CONTROLLED WITH YOUR NAUSEA MEDICATION *UNUSUAL SHORTNESS OF BREATH *UNUSUAL BRUISING OR BLEEDING *URINARY PROBLEMS (pain or burning when urinating, or frequent urination) *BOWEL PROBLEMS (unusual diarrhea, constipation, pain near the anus) TENDERNESS IN MOUTH AND THROAT WITH OR WITHOUT PRESENCE OF ULCERS (sore throat, sores in mouth,  or a toothache) UNUSUAL RASH, SWELLING OR PAIN  UNUSUAL VAGINAL DISCHARGE OR ITCHING   Items with * indicate a potential emergency and should be followed up as soon as possible or go to the Emergency Department if any problems should occur.  Please show the CHEMOTHERAPY ALERT CARD or IMMUNOTHERAPY ALERT CARD at check-in to the Emergency Department and triage nurse.  Should you have questions after your visit or need to cancel or reschedule your  appointment, please contact [facility]  Dept: [phone]  and follow the prompts.  Office hours are 8:00 a.m. to 4:30 p.m. Monday - Friday. Please note that voicemails left after 4:00 p.m. may not be returned until the following business day.  We are closed weekends and major holidays. You have access to a nurse at all times for urgent questions. Please call the main number to the clinic Dept: [phone] and follow the prompts.  For any non-urgent questions, you may also contact your provider using MyChart. We now offer e-Visits for anyone 18 and older to request care online for non-urgent symptoms. For details visit [email or web address].   Also download the MyChart app! Go to the app store, search "MyChart", open the app, select [facility], and log in with your MyChart username and password.  Masks are optional in the cancer centers. If you would like for your care team to wear a mask while they are taking care of you, please let them know. You may have one support person who is at least 77 years old accompany you for your appointments.

## 2022-11-12 ENCOUNTER — Encounter: Payer: Self-pay | Admitting: Oncology

## 2022-11-17 NOTE — Progress Notes (Signed)
[facility]  [address] [place],  [place]  [address] [phone]  Clinic Day:  11/18/2022  Referring physician: Landry Mellow, MD  HISTORY OF PRESENT ILLNESS:  The patient is a 76 y.o. male with metastatic non-small cell lung cancer, which includes brain metastasis and a metastatic focus of disease to his left acromion process.  He continues to take capmatinib, but at 200 mg daily.  Of note, recent CT scans of his chest/abdomen/pelvis did not show evidence of disease progression.  However, a recent brain MRI did show an enlarging lesion in his right parietal lobe worrisome for CNS disease progression.  Based upon this, the patient is receiving Avastin.  He comes in today to be evaluated before heading into his 3rd cycle of Avastin.  Since his last visit, the patient has been doing okay.  However, he claims to have fallen 2-3 times since then.  He attributes all of the falls to him accidentally tripping over certain objects at home.  However, this gentleman historically has never had issues with falling, which raises the concern about possible CNS disease progression.  Along the same lines, he denies having the tingling going down his left arm, which for shadows CNS disease metastasis.  This gentleman was recently found to have bilateral pulmonary emboli per a CT angiogram at his last visit.  Although he still has mild shortness of breath, he does not have any of the significant chest pain which ultimately highlighted the presence of pulmonary emboli being present.  With respect to his lung cancer history, his initial lung cancer pathology showed squamous cell carcinoma in February 2019.   He initially received carboplatin/paclitaxel/pembrolizumab for his disease management before being transitioned to maintenance pembrolizumab.   He stayed on this agent for numerous months, which helped with his disease control.  However, pathology from his metastatic brain  resection from a craniotomy in May 2021 showed adenocarcinoma.  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   He has also undergone stereotactic radiation in [place] to treat his primary left lung mass.  He has also undergone salvage SRS to his brain lesions in August 2021 and October 2021.    PHYSICAL EXAM:  There were no vitals taken for this visit.  His O2 sat never fell below 93% upon walking. Wt Readings from Last 3 Encounters:  11/06/22 258 lb 0.6 oz (117 kg)  11/03/22 254 lb 3.2 oz (115.3 kg)  10/19/22 260 lb (117.9 kg)   There is no height or weight on file to calculate BMI. Performance status: 1 Physical Exam Constitutional:      Appearance: Normal appearance. He is not ill-appearing.     Comments: He appears moderately dyspneic  HENT:     Mouth/Throat:     Mouth: Mucous membranes are moist.     Pharynx: Oropharynx is clear. No oropharyngeal exudate or posterior oropharyngeal erythema.  Cardiovascular:     Rate and Rhythm: Normal rate and regular rhythm.     Heart sounds: No murmur heard.    No friction rub. No gallop.  Pulmonary:     Effort: Pulmonary effort is normal. No respiratory distress.     Breath sounds: Normal breath sounds. No wheezing, rhonchi or rales.  Abdominal:     General: Bowel sounds are normal. There is no distension.     Palpations: Abdomen is soft. There is no mass.  Tenderness: There is no abdominal tenderness.  Musculoskeletal:        General: No swelling.     Right lower leg: No edema.     Left lower leg: No edema.  Lymphadenopathy:     Cervical: No cervical adenopathy.     Upper Body:     Right upper body: No supraclavicular or axillary adenopathy.     Left upper body: No supraclavicular or axillary adenopathy.     Lower Body: No right inguinal adenopathy. No left inguinal adenopathy.  Skin:    General: Skin is warm.     Coloration:  Skin is not jaundiced.     Findings: No lesion or rash.  Neurological:     General: No focal deficit present.     Mental Status: He is alert and oriented to person, place, and time. Mental status is at baseline.     Coordination: Coordination abnormal (Mild dysdiadochokinesis seen with his left hand).  Psychiatric:        Mood and Affect: Mood normal.        Behavior: Behavior normal.        Thought Content: Thought content normal.   ASSESSMENT & PLAN:  Assessment/Plan:  A 76 y.o. 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  The patient will proceed with his third and final treatment of Avastin this week.  He is already scheduled to undergo a brain MRI next week to ascertain his new disease baseline after 3 cycles of Avastin.  As mentioned previously, CT scans have not shown any evidence of disease progression elsewhere.  Based upon this, he knows to continue taking capmatinib 200 mg daily.  Due to his bilateral pulmonary emboli, he knows he will need to continue taking Xarelto indefinitely.  Otherwise, as he is clinically doing fairly well, I will see him back in 6 weeks for repeat clinical assessment.  Both the patient and his wife know to contact our office before his next visit if he runs into any problems that require immediate clinical attention.  Danilynn Jemison Macarthur Critchley, MD

## 2022-11-18 ENCOUNTER — Telehealth: Payer: Self-pay | Admitting: Oncology

## 2022-11-18 ENCOUNTER — Inpatient Hospital Stay: Payer: No Typology Code available for payment source

## 2022-11-18 ENCOUNTER — Inpatient Hospital Stay (INDEPENDENT_AMBULATORY_CARE_PROVIDER_SITE_OTHER): Payer: No Typology Code available for payment source | Admitting: Oncology

## 2022-11-18 ENCOUNTER — Other Ambulatory Visit: Payer: Self-pay | Admitting: Oncology

## 2022-11-18 DIAGNOSIS — C7931 Secondary malignant neoplasm of brain: Secondary | ICD-10-CM

## 2022-11-18 DIAGNOSIS — Z5112 Encounter for antineoplastic immunotherapy: Secondary | ICD-10-CM | POA: Diagnosis not present

## 2022-11-18 LAB — CBC AND DIFFERENTIAL
HCT: 46 (ref 41–53)
Hemoglobin: 15.3 (ref 13.5–17.5)
Neutrophils Absolute: 9.72
Platelets: 179 10*3/uL (ref 150–400)
WBC: 12

## 2022-11-18 LAB — TOTAL PROTEIN, URINE DIPSTICK: Protein, ur: NEGATIVE mg/dL

## 2022-11-18 LAB — CBC: RBC: 5.02 (ref 3.87–5.11)

## 2022-11-18 NOTE — Telephone Encounter (Signed)
11/18/22 Next appt scheduled and confirmed with patient

## 2022-11-19 ENCOUNTER — Other Ambulatory Visit: Payer: No Typology Code available for payment source

## 2022-11-19 ENCOUNTER — Ambulatory Visit: Payer: No Typology Code available for payment source | Admitting: Oncology

## 2022-11-19 ENCOUNTER — Other Ambulatory Visit: Payer: Self-pay

## 2022-11-19 MED FILL — Bevacizumab-awwb IV Soln 400 MG/16ML (For Infusion): INTRAVENOUS | Qty: 48 | Status: AC

## 2022-11-20 ENCOUNTER — Inpatient Hospital Stay: Payer: No Typology Code available for payment source

## 2022-11-20 VITALS — BP 146/96 | HR 93 | Temp 97.5°F | Resp 20 | Wt 249.0 lb

## 2022-11-20 DIAGNOSIS — Z5112 Encounter for antineoplastic immunotherapy: Secondary | ICD-10-CM | POA: Diagnosis not present

## 2022-11-20 DIAGNOSIS — C7931 Secondary malignant neoplasm of brain: Secondary | ICD-10-CM

## 2022-11-20 MED ORDER — SODIUM CHLORIDE 0.9 % IV SOLN
10.0000 mg/kg | Freq: Once | INTRAVENOUS | Status: AC
  Administered 2022-11-20: 1200 mg via INTRAVENOUS
  Filled 2022-11-20: qty 48

## 2022-11-20 MED ORDER — HEPARIN SOD (PORK) LOCK FLUSH 100 UNIT/ML IV SOLN: 500.0000 [IU] | Freq: Once | INTRAVENOUS | Status: DC | PRN

## 2022-11-20 MED ORDER — SODIUM CHLORIDE 0.9 % IV SOLN: Freq: Once | INTRAVENOUS | Status: AC

## 2022-11-20 MED ORDER — SODIUM CHLORIDE 0.9% FLUSH: 10.0000 mL | INTRAVENOUS | Status: DC | PRN

## 2022-11-20 NOTE — Patient Instructions (Signed)
Bevacizumab Injection What is this medication? BEVACIZUMAB (be va SIZ yoo mab) treats some types of cancer. It works by blocking a protein that causes cancer cells to grow and multiply. This helps to slow or stop the spread of cancer cells. It is a monoclonal antibody. This medicine may be used for other purposes; ask your health care provider or pharmacist if you have questions. COMMON BRAND NAME(S): Alymsys, Avastin, MVASI, Noah Charon What should I tell my care team before I take this medication? They need to know if you have any of these conditions: Blood clots Coughing up blood Having or recent surgery Heart failure High blood pressure History of a connection between 2 or more body parts that do not usually connect (fistula) History of a tear in your stomach or intestines Protein in your urine An unusual or allergic reaction to bevacizumab, other medications, foods, dyes, or preservatives Pregnant or trying to get pregnant Breast-feeding How should I use this medication? This medication is injected into a vein. It is given by your care team in a hospital or clinic setting. Talk to your care team the use of this medication in children. Special care may be needed. Overdosage: If you think you have taken too much of this medicine contact a poison control center or emergency room at once. NOTE: This medicine is only for you. Do not share this medicine with others. What if I miss a dose? Keep appointments for follow-up doses. It is important not to miss your dose. Call your care team if you are unable to keep an appointment. What may interact with this medication? Interactions are not expected. This list may not describe all possible interactions. Give your health care provider a list of all the medicines, herbs, non-prescription drugs, or dietary supplements you use. Also tell them if you smoke, drink alcohol, or use illegal drugs. Some items may interact with your medicine. What should I  watch for while using this medication? Your condition will be monitored carefully while you are receiving this medication. You may need blood work while taking this medication. This medication may make you feel generally unwell. This is not uncommon as chemotherapy can affect healthy cells as well as cancer cells. Report any side effects. Continue your course of treatment even though you feel ill unless your care team tells you to stop. This medication may increase your risk to bruise or bleed. Call your care team if you notice any unusual bleeding. Before having surgery, talk to your care team to make sure it is ok. This medication can increase the risk of poor healing of your surgical site or wound. You will need to stop this medication for 28 days before surgery. After surgery, wait at least 28 days before restarting this medication. Make sure the surgical site or wound is healed enough before restarting this medication. Talk to your care team if questions. Talk to your care team if you may be pregnant. Serious birth defects can occur if you take this medication during pregnancy and for 6 months after the last dose. Contraception is recommended while taking this medication and for 6 months after the last dose. Your care team can help you find the option that works for you. Do not breastfeed while taking this medication and for 6 months after the last dose. This medication can cause infertility. Talk to your care team if you are concerned about your fertility. What side effects may I notice from receiving this medication? Side effects that you should report  to your care team as soon as possible: Allergic reactions--skin rash, itching, hives, swelling of the face, lips, tongue, or throat Bleeding--bloody or black, tar-like stools, vomiting blood or brown material that looks like coffee grounds, red or dark brown urine, small red or purple spots on skin, unusual bruising or bleeding Blood clot--pain,  swelling, or warmth in the leg, shortness of breath, chest pain Heart attack--pain or tightness in the chest, shoulders, arms, or jaw, nausea, shortness of breath, cold or clammy skin, feeling faint or lightheaded Heart failure--shortness of breath, swelling of the ankles, feet, or hands, sudden weight gain, unusual weakness or fatigue Increase in blood pressure Infection--fever, chills, cough, sore throat, wounds that don't heal, pain or trouble when passing urine, general feeling of discomfort or being unwell Infusion reactions--chest pain, shortness of breath or trouble breathing, feeling faint or lightheaded Kidney injury--decrease in the amount of urine, swelling of the ankles, hands, or feet Stomach pain that is severe, does not go away, or gets worse Stroke--sudden numbness or weakness of the face, arm, or leg, trouble speaking, confusion, trouble walking, loss of balance or coordination, dizziness, severe headache, change in vision Sudden and severe headache, confusion, change in vision, seizures, which may be signs of posterior reversible encephalopathy syndrome (PRES) Side effects that usually do not require medical attention (report to your care team if they continue or are bothersome): Back pain Change in taste Diarrhea Dry skin Increased tears Nosebleed This list may not describe all possible side effects. Call your doctor for medical advice about side effects. You may report side effects to FDA at 1-800-FDA-1088. Where should I keep my medication? This medication is given in a hospital or clinic. It will not be stored at home. NOTE: This sheet is a summary. It may not cover all possible information. If you have questions about this medicine, talk to your doctor, pharmacist, or health care provider.  2023 Elsevier/Gold Standard (2022-03-27 00:00:00)

## 2022-11-22 ENCOUNTER — Other Ambulatory Visit: Payer: Self-pay

## 2022-11-24 ENCOUNTER — Other Ambulatory Visit: Payer: Self-pay

## 2022-11-24 ENCOUNTER — Ambulatory Visit: Payer: No Typology Code available for payment source | Admitting: Internal Medicine

## 2022-11-24 NOTE — Telephone Encounter (Signed)
Opened in error

## 2022-11-25 ENCOUNTER — Ambulatory Visit
Admission: RE | Admit: 2022-11-25 | Discharge: 2022-11-25 | Disposition: A | Discharge status: Home / Self Care | Payer: No Typology Code available for payment source | Source: Ambulatory Visit | Attending: Internal Medicine | Admitting: Internal Medicine

## 2022-11-25 DIAGNOSIS — C7931 Secondary malignant neoplasm of brain: Secondary | ICD-10-CM

## 2022-11-25 MED ORDER — GADOPICLENOL 0.5 MMOL/ML IV SOLN
10.0000 mL | Freq: Once | INTRAVENOUS | Status: AC | PRN
  Administered 2022-11-25: 10 mL via INTRAVENOUS

## 2022-11-28 ENCOUNTER — Other Ambulatory Visit: Payer: Self-pay | Admitting: Oncology

## 2022-11-28 DIAGNOSIS — C3412 Malignant neoplasm of upper lobe, left bronchus or lung: Secondary | ICD-10-CM

## 2022-11-28 DIAGNOSIS — I2782 Chronic pulmonary embolism: Secondary | ICD-10-CM

## 2022-11-28 DIAGNOSIS — R062 Wheezing: Secondary | ICD-10-CM

## 2022-12-01 ENCOUNTER — Encounter: Payer: Self-pay | Admitting: Oncology

## 2022-12-01 ENCOUNTER — Inpatient Hospital Stay (HOSPITAL_BASED_OUTPATIENT_CLINIC_OR_DEPARTMENT_OTHER): Payer: No Typology Code available for payment source | Admitting: Internal Medicine

## 2022-12-01 ENCOUNTER — Encounter: Payer: Self-pay | Admitting: Hematology and Oncology

## 2022-12-01 ENCOUNTER — Other Ambulatory Visit: Payer: Self-pay

## 2022-12-01 VITALS — BP 150/81 | HR 91 | Temp 97.7°F | Resp 18 | Ht 70.0 in | Wt 252.4 lb

## 2022-12-01 DIAGNOSIS — Z5112 Encounter for antineoplastic immunotherapy: Secondary | ICD-10-CM | POA: Diagnosis not present

## 2022-12-01 DIAGNOSIS — C7931 Secondary malignant neoplasm of brain: Secondary | ICD-10-CM

## 2022-12-01 MED ORDER — RIVAROXABAN 20 MG PO TABS: 20.0000 mg | ORAL_TABLET | Freq: Every day | ORAL | 0 refills | Status: DC

## 2022-12-01 MED ORDER — DEXAMETHASONE 1 MG PO TABS: 1.0000 mg | ORAL_TABLET | Freq: Every day | ORAL | 0 refills | Status: DC

## 2022-12-01 NOTE — Progress Notes (Signed)
[facility] [place], [place] [address] [phone]   Interval Evaluation  Date of Service: 12/01/22 Patient Name: Alex Cordova Patient MRN: [id number] Patient DOB: 16-Jun-1946 Provider: Ventura Sellers, MD  Identifying Statement:  Alex Cordova is a 76 y.o. male with Malignant neoplasm metastatic to brain [facility]) [C79.31]   Primary Cancer: [place] Lung Stage IV  Oncologic History: 04/18/20: Pre-op SRS R temporal 14 Gy 04/19/20: Craniotomy, resection by Dr. Saintclair Halsted 07/30/20: Salvage SRS R subcortex 09/26/21: Salvage SRS 1.5cm R frontal met  Interval History: Alex Cordova presents today for follow up after recent MRI brain, now s/p 3 cycles of avastin for radiation necrosis.  His left sided weakness is unchanged or slightly improved.  Swelling in hands, feet, face and belly are still present.  Still complains of fatigue, lethargy as prior.  Decadron remains at 58m daily.  No further seizures, currently dosing Keppra 1000/500.  Continues on Capmatinib now with Dr. LBobby Rumpfin ABensley  H+P (07/23/20) Patient presents to clinic today for follow up after 3 months post-SRS/craniotomy MRI scan.  He describes no new or progressive neurologic deficits.  No headaches or seizures.  He no longer takes Keppra and he has recently completed tapering off all corticosteroids.  He remains relatively active around the home, independent with gait and activities of daily living.  Currently on observation and serial imaging monitoring with Dr. LBobby Rumpffor lung cancer.   Medications: Current Outpatient Medications on File Prior to Visit  Medication Sig Dispense Refill   acetaminophen (TYLENOL) 325 MG tablet Take 650 mg by mouth every 6 (six) hours as needed.     albuterol (VENTOLIN HFA) 108 (90 Base) MCG/ACT inhaler Inhale 2 puffs into the lungs every 6 (six) hours as needed for wheezing or shortness of breath. 8 g 2   Alogliptin Benzoate  25 MG TABS TAKE ONE-HALF TABLET BY MOUTH DAILY FOR DIABETES     Calcium Carb-Cholecalciferol 600-10 MG-MCG TABS TAKE 1 TABLET BY MOUTH EVERY MORNING AFTER BREAKFAST     capmatinib (TABRECTA) 200 MG tablet Take 2 tablets (400 mg total) by mouth 2 (two) times daily. 120 tablet 3   cetirizine (ZYRTEC) 10 MG tablet Take 1 tablet by mouth daily.     dexamethasone (DECADRON) 2 MG tablet Take 1 tablet (2 mg total) by mouth daily. 60 tablet 1   esomeprazole (NEXIUM) 40 MG capsule Take 40 mg by mouth daily at 12 noon.     furosemide (LASIX) 20 MG tablet TAKE AS NEEDED FOR SWELLING 90 tablet 1   furosemide (LASIX) 20 MG tablet 1-2 TABS DAILY TAKE AS NEEDED FOR SWELLING 180 tablet 1   Hypertonic Nasal Wash (SINUS RINSE BOTTLE KIT NA) 2 (two) times daily.     ibuprofen (ADVIL) 200 MG tablet Take 200 mg by mouth every 6 (six) hours as needed.     ipratropium (ATROVENT) 0.03 % nasal spray SPRAY 2 SPRAYS INTO EACH NOSTRIL EVERY 8 TO 12 HOURS     levETIRAcetam (KEPPRA) 750 MG tablet TAKE 1 TABLET BY MOUTH IN MORNING, 1 TAB MID DAY AND 2 TABS AT BEDTIME. 360 tablet 1   LORazepam (ATIVAN) 0.5 MG tablet One tab po q 6-8 hrs prn anxiety/seizures 30 tablet 1   Multiple Vitamins-Minerals (OPTIVITE PO) Take 1 tablet by mouth daily. Eye vitamin     potassium chloride SA (KLOR-CON M) 20 MEQ tablet TAKE ONE TABLET BY MOUTH DAILY (TAKE WITH FOOD)  pravastatin (PRAVACHOL) 40 MG tablet TAKE ONE TABLET BY MOUTH AT BEDTIME FOR CHOLESTEROL     Psyllium (METAMUCIL PO) Take 1 Scoop by mouth daily.     traMADol (ULTRAM) 50 MG tablet Take 1 tablet (50 mg total) by mouth every 6 (six) hours as needed. 30 tablet 0   Current Facility-Administered Medications on File Prior to Visit  Medication Dose Route Frequency Provider Last Rate Last Admin   sodium chloride flush (NS) 0.9 % injection 10 mL  10 mL Intracatheter PRN Mosher, Kelli A, PA-C   10 mL at 09/08/21 0900    Allergies:  Allergies  Allergen Reactions   Metformin  Diarrhea   Misc. Sulfonamide Containing Compounds    Other Swelling    Tongue swelling   Rosuvastatin     Other reaction(s): Myositis   Sulfamethoxazole Nausea Only    Childhood allergy   Past Medical History:  Past Medical History:  Diagnosis Date   Arthritis    Diabetes mellitus type 2 in obese (HCC)    Full dentures    GERD (gastroesophageal reflux disease)    Hyperlipidemia    Lung cancer ([place])    Metastatic cancer to brain ([place])    Pneumonia    Wears glasses    Past Surgical History:  Past Surgical History:  Procedure Laterality Date   APPLICATION OF CRANIAL NAVIGATION N/A 04/19/2020   Procedure: APPLICATION OF CRANIAL NAVIGATION;  Surgeon: Kary Kos, MD;  Location: [facility];  Service: Neurosurgery;  Laterality: N/A;  APPLICATION OF CRANIAL NAVIGATION   COLONOSCOPY W/ BIOPSIES AND POLYPECTOMY     CRANIOTOMY Right 04/19/2020   Procedure: Right Temporal stereotactic craniotomy;  Surgeon: Kary Kos, MD;  Location: [facility];  Service: Neurosurgery;  Laterality: Right;   FRACTURE SURGERY     right shoulder   MULTIPLE TOOTH EXTRACTIONS     Social History:  Social History   Socioeconomic History   Marital status: Married    Spouse name: Not on file   Number of children: Not on file   Years of education: Not on file   Highest education level: Not on file  Occupational History   Not on file  Tobacco Use   Smoking status: Former    Types: Cigarettes   Smokeless tobacco: Never   Tobacco comments:    quit smoking in 1972  Vaping Use   Vaping Use: Never used  Substance and Sexual Activity   Alcohol use: Not Currently    Comment: not since 1972   Drug use: Never   Sexual activity: Not on file  Other Topics Concern   Not on file  Social History Narrative   Not on file   Social Determinants of Health   Financial Resource Strain: Not on file  Food Insecurity: Not on file  Transportation Needs: Not on file  Physical Activity: Not on file  Stress: Not on file  Social  Connections: Not on file  Intimate Partner Violence: Not on file   Family History: No family history on file.  Review of Systems: Constitutional: Doesn't report fevers, chills or abnormal weight loss Eyes: Doesn't report blurriness of vision Ears, nose, mouth, throat, and face: Doesn't report sore throat Respiratory: Doesn't report cough, dyspnea or wheezes Cardiovascular: Doesn't report palpitation, chest discomfort  Gastrointestinal:  Doesn't report nausea, constipation, diarrhea GU: Doesn't report incontinence Skin: Doesn't report skin rashes Neurological: Per HPI Musculoskeletal: Doesn't report joint pain Behavioral/Psych: Doesn't report anxiety  Physical Exam: Vitals:   12/01/22 1149  BP: Marland Kitchen)  150/81  Pulse: 91  Resp: 18  Temp: 97.7 F (36.5 C)  SpO2: 98%    KPS: 80. General: Alert, cooperative, pleasant, in no acute distress Head: Normal EENT: No conjunctival injection or scleral icterus.  Lungs: Resp effort normal Cardiac: Regular rate Abdomen: Non-distended abdomen Skin: No rashes cyanosis or petechiae. Extremities: No clubbing or edema  Neurologic Exam: Mental Status: Awake, alert, attentive to examiner. Oriented to self and environment. Language is fluent with intact comprehension.  Cranial Nerves: Visual acuity is grossly normal. Visual fields are full. Extra-ocular movements intact. No ptosis. Face symmetric Motor: Tone and bulk are normal. Mild drift in left arm. Reflexes are symmetric, no pathologic reflexes present.  Sensory: Intact to light touch Gait: Dystaxic   Labs: I have reviewed the data as listed    Component Value Date/Time   NA 144 10/19/2022 1038   NA 136 (A) 08/17/2022 0000   K 3.7 10/19/2022 1038   CL 101 10/19/2022 1038   CO2 30 10/19/2022 1038   GLUCOSE 133 (H) 10/19/2022 1038   BUN 22 10/19/2022 1038   BUN 15 08/17/2022 0000   CREATININE 1.13 10/19/2022 1038   CALCIUM 9.2 10/19/2022 1038   PROT 6.1 (L) 10/19/2022 1038   PROT  5.6 (A) 12/24/2021 0000   ALBUMIN 3.3 (L) 10/19/2022 1038   AST 23 10/19/2022 1038   ALT 26 10/19/2022 1038   ALKPHOS 51 10/19/2022 1038   BILITOT 0.9 10/19/2022 1038   GFRNONAA >60 10/19/2022 1038   GFRAA >60 07/23/2020 1253   Lab Results  Component Value Date   WBC 12.0 11/18/2022   NEUTROABS 9.72 11/18/2022   HGB 15.3 11/18/2022   HCT 46 11/18/2022   MCV 88 12/24/2021   PLT 179 11/18/2022   Imaging:  Post Clinician Interpretation: I have personally reviewed the CNS images as listed.  My interpretation, in the context of the patient's clinical presentation, is treatment effect vs true progression  MR BRAIN W WO CONTRAST  Result Date: 11/27/2022 CLINICAL DATA:  Treated brain metastases, restaging. EXAM: MRI HEAD WITHOUT AND WITH CONTRAST TECHNIQUE: Multiplanar, multiecho pulse sequences of the brain and surrounding structures were obtained without and with intravenous contrast. CONTRAST:  10 cc of vueway intravenous COMPARISON:  10/02/2022 FINDINGS: BRAIN New Lesions: None. Larger lesions: None. Stable or Smaller lesions: A right frontal lesion on 14:112 has decreased in enhancing avidity, especially at the periphery, and also measures a few mm smaller at 29 x 19 mm compared to 33 x 24 mm on prior. A lesion in the right parietal lobe measuring 9 mm on 14:115 is also decreased in the degree of enhancement and size. A right temporal lobe lesion with prior resection is unchanged with plaque-like enhancement beneath the bone flap which may be postoperative. There has been a mild increase in adjacent vasogenic edema. No new lesion is seen. Other Brain findings: Decrease in vasogenic edema around the upper lesions in the right hemisphere. Mild increase in edematous signal in the right temporal lobe adjacent to the craniotomy site. No infarct, hydrocephalus, or collection. Vascular: Major vessels are enhancing Skull and upper cervical spine: Unremarkable craniotomy site Sinuses/Orbits: Negative  IMPRESSION: 1. Decreased size of right frontal and parietal metastases. Vasogenic edema is also diminished in this area. 2. Mildly increased edema at the right temporal resection site but no change in enhancement. 3. No new lesion. Electronically Signed   By: Jorje Guild M.D.   On: 11/27/2022 16:45    Assessment/Plan Malignant neoplasm metastatic to  brain [facility]) [C79.31]  Alex Cordova is clinically stable today, now s/p 3 cycles of avastin 82m/kg for suspected radiation necrosis with Dr. LBobby Rumpf    MRI demonstrates stable findings or contraction in volume of enhnacement and T2 signal abnormality.  This is consistent with response to therapy.     Keppra should con't 1500/750.  Decadron should decrease to 168mdaily, then stop in ~2 weeks if tolerated.  We appreciate the opportunity to participate in the care of Alex Cordova   We ask that Alex Froningeturn to clinic in 4 months following next brain MRI, or sooner as needed.  All questions were answered. The patient knows to call the clinic with any problems, questions or concerns. No barriers to learning were detected.  I have spent a total of 30 minutes of face-to-face and non-face-to-face time, excluding clinical staff time, preparing to see patient, ordering tests and/or medications, counseling the patient, and independently interpreting results and communicating results to the patient/family/caregiver    ZaVentura SellersMD Medical Director of Neuro-Oncology [facility] [place]2/26/23 11:58 AM

## 2022-12-03 ENCOUNTER — Other Ambulatory Visit: Payer: Self-pay

## 2022-12-03 ENCOUNTER — Other Ambulatory Visit: Payer: Self-pay | Admitting: Oncology

## 2022-12-03 DIAGNOSIS — G8929 Other chronic pain: Secondary | ICD-10-CM

## 2022-12-03 DIAGNOSIS — C3412 Malignant neoplasm of upper lobe, left bronchus or lung: Secondary | ICD-10-CM

## 2022-12-07 DIAGNOSIS — J0110 Acute frontal sinusitis, unspecified: Secondary | ICD-10-CM | POA: Diagnosis not present

## 2022-12-08 ENCOUNTER — Other Ambulatory Visit: Payer: Self-pay | Admitting: Radiation Therapy

## 2022-12-08 ENCOUNTER — Other Ambulatory Visit: Payer: Self-pay | Admitting: *Deleted

## 2022-12-08 DIAGNOSIS — Z95828 Presence of other vascular implants and grafts: Secondary | ICD-10-CM

## 2022-12-18 ENCOUNTER — Telehealth: Payer: Self-pay

## 2022-12-18 NOTE — Telephone Encounter (Signed)
Nose bleeds last approx 3-4 minuets.  Some produce a moderate amount of blood, however most produce a minimal amount.  Dr. Melvyn Neth is not changing xarelto 20mg  at this time.  He is to continue taking as prescribed.  I instructed Mrs. Weddington to keep Alex Cordova informed on if they become more frequent or increase in amount.  She assured me she would.

## 2022-12-29 NOTE — Progress Notes (Unsigned)
[facility]  [address] [place],  [place]  [address] [phone]  Clinic Day:  11/18/2022  Referring physician: Anson Fret, MD  HISTORY OF PRESENT ILLNESS:  The patient is a 77 y.o. male with metastatic non-small cell lung cancer, which includes brain metastasis and a metastatic focus of disease to his left acromion process.  He continues to take capmatinib, but at 200 mg daily.  Of note, recent CT scans of his chest/abdomen/pelvis did not show evidence of disease progression.  However, a recent brain MRI did show an enlarging lesion in his right parietal lobe worrisome for CNS disease progression.  Based upon this, the patient is receiving Avastin.  He comes in today to be evaluated before heading into his 3rd cycle of Avastin.  Since his last visit, the patient has been doing okay.  However, he claims to have fallen 2-3 times since then.  He attributes all of the falls to him accidentally tripping over certain objects at home.  However, this gentleman historically has never had issues with falling, which raises the concern about possible CNS disease progression.  Along the same lines, he denies having the tingling going down his left arm, which for shadows CNS disease metastasis.  This gentleman was recently found to have bilateral pulmonary emboli per a CT angiogram at his last visit.  Although he still has mild shortness of breath, he does not have any of the significant chest pain which ultimately highlighted the presence of pulmonary emboli being present.  With respect to his lung cancer history, his initial lung cancer pathology showed squamous cell carcinoma in February 2019.   He initially received carboplatin/paclitaxel/pembrolizumab for his disease management before being transitioned to maintenance pembrolizumab.   He stayed on this agent for numerous months, which helped with his disease control.  However, pathology from his metastatic brain  resection from a craniotomy in May 2021 showed adenocarcinoma.  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   He has also undergone stereotactic radiation in [place] to treat his primary left lung mass.  He has also undergone salvage SRS to his brain lesions in August 2021 and October 2021.    PHYSICAL EXAM:  There were no vitals taken for this visit.  His O2 sat never fell below 93% upon walking. Wt Readings from Last 3 Encounters:  12/01/22 252 lb 6.4 oz (114.5 kg)  11/20/22 249 lb (112.9 kg)  11/18/22 251 lb 1.6 oz (113.9 kg)   There is no height or weight on file to calculate BMI. Performance status: 1 Physical Exam Constitutional:      Appearance: Normal appearance. He is not ill-appearing.     Comments: He appears moderately dyspneic  HENT:     Mouth/Throat:     Mouth: Mucous membranes are moist.     Pharynx: Oropharynx is clear. No oropharyngeal exudate or posterior oropharyngeal erythema.  Cardiovascular:     Rate and Rhythm: Normal rate and regular rhythm.     Heart sounds: No murmur heard.    No friction rub. No gallop.  Pulmonary:     Effort: Pulmonary effort is normal. No respiratory distress.     Breath sounds: Normal breath sounds. No wheezing, rhonchi or rales.  Abdominal:     General: Bowel sounds are normal. There is no distension.     Palpations: Abdomen is soft. There is no mass.  Tenderness: There is no abdominal tenderness.  Musculoskeletal:        General: No swelling.     Right lower leg: No edema.     Left lower leg: No edema.  Lymphadenopathy:     Cervical: No cervical adenopathy.     Upper Body:     Right upper body: No supraclavicular or axillary adenopathy.     Left upper body: No supraclavicular or axillary adenopathy.     Lower Body: No right inguinal adenopathy. No left inguinal adenopathy.  Skin:    General: Skin is warm.      Coloration: Skin is not jaundiced.     Findings: No lesion or rash.  Neurological:     General: No focal deficit present.     Mental Status: He is alert and oriented to person, place, and time. Mental status is at baseline.     Coordination: Coordination abnormal (Mild dysdiadochokinesis seen with his left hand).  Psychiatric:        Mood and Affect: Mood normal.        Behavior: Behavior normal.        Thought Content: Thought content normal.   ASSESSMENT & PLAN:  Assessment/Plan:  A 77 y.o. 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  The patient will proceed with his third and final treatment of Avastin this week.  He is already scheduled to undergo a brain MRI next week to ascertain his new disease baseline after 3 cycles of Avastin.  As mentioned previously, CT scans have not shown any evidence of disease progression elsewhere.  Based upon this, he knows to continue taking capmatinib 200 mg daily.  Due to his bilateral pulmonary emboli, he knows he will need to continue taking Xarelto indefinitely.  Otherwise, as he is clinically doing fairly well, I will see him back in 6 weeks for repeat clinical assessment.  Both the patient and his wife know to contact our office before his next visit if he runs into any problems that require immediate clinical attention.  Ollie Delano Kirby Funk, MD

## 2022-12-30 ENCOUNTER — Inpatient Hospital Stay: Payer: No Typology Code available for payment source

## 2022-12-30 ENCOUNTER — Other Ambulatory Visit: Payer: Self-pay | Admitting: Oncology

## 2022-12-30 ENCOUNTER — Inpatient Hospital Stay: Payer: No Typology Code available for payment source | Attending: Internal Medicine | Admitting: Oncology

## 2022-12-30 VITALS — BP 176/101 | HR 112 | Temp 99.1°F | Resp 22 | Ht 70.0 in

## 2022-12-30 DIAGNOSIS — C7931 Secondary malignant neoplasm of brain: Secondary | ICD-10-CM

## 2022-12-30 DIAGNOSIS — C3412 Malignant neoplasm of upper lobe, left bronchus or lung: Secondary | ICD-10-CM | POA: Diagnosis not present

## 2022-12-30 LAB — BASIC METABOLIC PANEL
BUN: 10 (ref 4–21)
CO2: 26 — AB (ref 13–22)
Chloride: 103 (ref 99–108)
Creatinine: 1 (ref 0.6–1.3)
Glucose: 124
Potassium: 4.2 mEq/L (ref 3.5–5.1)
Sodium: 134 — AB (ref 137–147)

## 2022-12-30 LAB — COMPREHENSIVE METABOLIC PANEL
Albumin: 3 — AB (ref 3.5–5.0)
Calcium: 8.6 — AB (ref 8.7–10.7)

## 2022-12-30 LAB — CBC AND DIFFERENTIAL
HCT: 45 (ref 41–53)
Hemoglobin: 15.6 (ref 13.5–17.5)
Neutrophils Absolute: 3.72
Platelets: 217 10*3/uL (ref 150–400)
WBC: 6.3

## 2022-12-30 LAB — HEPATIC FUNCTION PANEL
ALT: 36 U/L (ref 10–40)
AST: 45 — AB (ref 14–40)
Alkaline Phosphatase: 65 (ref 25–125)
Bilirubin, Total: 0.8

## 2022-12-30 LAB — CBC: RBC: 4.91 (ref 3.87–5.11)

## 2022-12-31 ENCOUNTER — Telehealth: Payer: Self-pay | Admitting: *Deleted

## 2022-12-31 ENCOUNTER — Telehealth: Payer: Self-pay | Admitting: Oncology

## 2022-12-31 ENCOUNTER — Other Ambulatory Visit: Payer: Self-pay | Admitting: *Deleted

## 2022-12-31 MED ORDER — DEXAMETHASONE 2 MG PO TABS: 2.0000 mg | ORAL_TABLET | Freq: Every day | ORAL | 0 refills | Status: DC

## 2022-12-31 NOTE — Telephone Encounter (Signed)
Contacted pt to schedule an appt per 12/30/22 LOS. Unable to reach via phone, voicemail was left.

## 2022-12-31 NOTE — Telephone Encounter (Signed)
Communicated medication instructions to wife.  She had some varying doses on hand and will give accordingly however needed additional to continue.  Rx was ordered.  She will call and give update Monday or Tuesday of next week.  If no improvement need to schedule in office visit.

## 2022-12-31 NOTE — Telephone Encounter (Signed)
Wife called to report that they saw Dr Bobby Rumpf yesterday and was told to go to [facility] due to his change is ability to walk and the changes of his mentation.  He was evaluated but to wife knowledge they did not perform any scans of his brain.  They did not find anything of concern but did order a antibiotic.  Wife is concerned that it may be another episode of edema on the brain.  He stopped his Decadron almost 2 weeks ago and 3 days ago he started to decline.    He previously was on Decadron 1 mg for a total of 14 days and then stopped.  Patient is back home now.  He has falled/slid out of his chair twice since returning home last night and wife has had to get assistance both times to get him up.    Routing to Dr Mickeal Skinner to please advise.  Next scan isn't expected until 04/01/2023.

## 2023-01-01 ENCOUNTER — Telehealth: Payer: Self-pay

## 2023-01-01 NOTE — Telephone Encounter (Signed)
Pt's wife wanted you to know that the ER couldn't find anything wrong, so sent him home. She called Dr Mickeal Skinner. He ordered Dexamethasone 8mg  day 1, then dexamethasone 2mg  po qd. "He is doing better, but not completely well".  Dr Bobby Rumpf notified. No further recommendations.

## 2023-01-02 DIAGNOSIS — R410 Disorientation, unspecified: Secondary | ICD-10-CM | POA: Diagnosis not present

## 2023-01-02 DIAGNOSIS — R531 Weakness: Secondary | ICD-10-CM | POA: Diagnosis not present

## 2023-01-04 ENCOUNTER — Telehealth: Payer: Self-pay | Admitting: *Deleted

## 2023-01-04 NOTE — Telephone Encounter (Signed)
Wanted to let us know that patient ended up going to [facility] on Saturday because he was declining further.  Found to have a blood infection.  Being treated with antibiotics.  Today patient is doing a little bit better.   She just wanted to let Dr Mickeal Skinner know.

## 2023-01-07 DIAGNOSIS — L0390 Cellulitis, unspecified: Secondary | ICD-10-CM | POA: Diagnosis not present

## 2023-01-19 NOTE — Progress Notes (Signed)
[facility]  [address] [place],  [place]  [id number] [phone]  Clinic Day:  01/20/2023  Referring physician: Landry Mellow, MD  HISTORY OF PRESENT ILLNESS:  The patient is a 77 y.o. male with metastatic non-small cell lung cancer, which includes brain metastasis and a metastatic focus of disease to his left acromion process.  The patient comes in today after recently being hospitalized.  He had a myriad of issues, with the major ones being altered mental status and potential sepsis from left arm cellulitis.  Over these past few weeks, the patient has been on a Decadron taper per neuro-oncology, which has helped immensely in restoring his thinking.  His left arm swelling has dissipated after receiving both oral and IV antibiotics.  Of note, his capmatinib has been held over these past few weeks as I wanted his other health issues to get rectified before restarting this agent.  He has noticed that a lot of swelling in his extremities has gone down since capmatinib has been held over these past few weeks.  He is doing much better currently and is willing to restart his capmatinib therapy.    With respect to his lung cancer history, his initial lung cancer pathology showed squamous cell carcinoma in February 2019.   He initially received carboplatin/paclitaxel/pembrolizumab for his disease management before being transitioned to maintenance pembrolizumab.   He stayed on this agent for numerous months, which helped with his disease control.  However, pathology from his metastatic brain resection from a craniotomy in May 2021 showed adenocarcinoma.  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   He has also undergone stereotactic radiation in [place] to treat his primary left lung mass.  He has also undergone salvage SRS to his brain lesions in August 2021 and  October 2021.  Of note, due to scans in late November 2023 showing bilateral pulmonary emboli, he is on Xarelto indefinitely.  Due to slight CNS progression per brain MRI, the patient did undergo 3 cycles of Avastin, which were completed in December 2023.  Scans afterwards showed slight improvement in both his CNS lesions and the surrounding edema.   PHYSICAL EXAM:  Blood pressure (!) 200/94, pulse 65, temperature 97.8 F (36.6 C), resp. rate 20, height 5\' 10"  (1.778 m), weight 239 lb 3.2 oz (108.5 kg), SpO2 96 %.   Wt Readings from Last 3 Encounters:  01/20/23 239 lb 3.2 oz (108.5 kg)  12/01/22 252 lb 6.4 oz (114.5 kg)  11/20/22 249 lb (112.9 kg)   Body mass index is 34.32 kg/m. Performance status: 1 Physical Exam Constitutional:      Appearance: Normal appearance. He is not ill-appearing.     Comments: He is in a wheelchair (new) and is somewhat confused.  He is actively coughing  HENT:     Mouth/Throat:     Mouth: Mucous membranes are moist.     Pharynx: Oropharynx is clear. No oropharyngeal exudate or posterior oropharyngeal erythema.  Cardiovascular:     Rate and Rhythm: Normal rate and regular rhythm.     Heart sounds: No murmur heard.    No friction rub. No gallop.  Pulmonary:     Effort: Pulmonary effort is normal. No respiratory distress.     Breath sounds: Normal breath sounds. No wheezing, rhonchi or rales.  Abdominal:     General: Bowel sounds are normal. There is no  distension.     Palpations: Abdomen is soft. There is no mass.     Tenderness: There is no abdominal tenderness.  Musculoskeletal:        General: No swelling.     Right lower leg: No edema.     Left lower leg: No edema.  Lymphadenopathy:     Cervical: No cervical adenopathy.     Upper Body:     Right upper body: No supraclavicular or axillary adenopathy.     Left upper body: No supraclavicular or axillary adenopathy.     Lower Body: No right inguinal adenopathy. No left inguinal adenopathy.  Skin:     General: Skin is warm.     Coloration: Skin is not jaundiced.     Findings: No lesion or rash.  Neurological:     General: No focal deficit present.     Mental Status: He is alert and oriented to person, place, and time. Mental status is at baseline.  Psychiatric:        Mood and Affect: Mood normal.        Behavior: Behavior normal.        Thought Content: Thought content normal.   LABS:  Latest Reference Range & Units 01/20/23 00:00 01/20/23 11:16  Sodium 135 - 145 mmol/L  140  Potassium 3.5 - 5.1 mmol/L  4.6  Chloride 98 - 111 mmol/L  104  CO2 22 - 32 mmol/L  24  Glucose 70 - 99 mg/dL  144 (H)  BUN 8 - 23 mg/dL  23  Creatinine 0.61 - 1.24 mg/dL  1.07  Calcium 8.9 - 10.3 mg/dL  9.4  Anion gap 5 - 15   12  Alkaline Phosphatase 38 - 126 U/L  39  Albumin 3.5 - 5.0 g/dL  3.2 (L)  AST 15 - 41 U/L  30  ALT 0 - 44 U/L  30  Total Protein 6.5 - 8.1 g/dL  6.1 (L)  Total Bilirubin 0.3 - 1.2 mg/dL  0.6  GFR, Est Non African American >60 mL/min  >60  CBC W DIFFERENTIAL ([facility] SCANNED REPORT)   Rpt  WBC  12.4 (E)   RBC 3.87 - 5.11  4.75 (E)   Hemoglobin 13.5 - 17.5  14.9 (E)   HCT 41 - 53  45 (E)   Platelets 150 - 400 K/uL 153 (E)   (H): Data is abnormally high (L): Data is abnormally low Rpt: View report in Results Review for more information (E): External lab result  ASSESSMENT & PLAN:  Assessment/Plan:  A 77 y.o. 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  As mentioned previously, he was hospitalized for cellulitis and altered mental status, which have both normalized.  He looks much better today.  The patient is comfortable with restarting his capmatinib.  Understandably, he is concerned about redeveloping extremity swelling from this agent.  He knows it will be important to weigh himself daily; if he gains more than 5 pounds within a week, he knows to take Lasix to get his weight back to its baseline.   I will see  this patient back in 1 month for repeat clinical assessment.  Both the patient and his wife understand all the plans discussed today and are in agreement with them.  Ociel Retherford Macarthur Critchley, MD

## 2023-01-20 ENCOUNTER — Inpatient Hospital Stay: Payer: No Typology Code available for payment source

## 2023-01-20 ENCOUNTER — Inpatient Hospital Stay: Payer: No Typology Code available for payment source | Attending: Internal Medicine | Admitting: Oncology

## 2023-01-20 ENCOUNTER — Telehealth: Payer: Self-pay | Admitting: Oncology

## 2023-01-20 VITALS — BP 200/94 | HR 65 | Temp 97.8°F | Resp 20 | Ht 70.0 in | Wt 239.2 lb

## 2023-01-20 DIAGNOSIS — C3412 Malignant neoplasm of upper lobe, left bronchus or lung: Secondary | ICD-10-CM | POA: Diagnosis not present

## 2023-01-20 DIAGNOSIS — C7931 Secondary malignant neoplasm of brain: Secondary | ICD-10-CM | POA: Diagnosis present

## 2023-01-20 DIAGNOSIS — Z7901 Long term (current) use of anticoagulants: Secondary | ICD-10-CM | POA: Insufficient documentation

## 2023-01-20 DIAGNOSIS — C3490 Malignant neoplasm of unspecified part of unspecified bronchus or lung: Secondary | ICD-10-CM | POA: Diagnosis not present

## 2023-01-20 DIAGNOSIS — Z86711 Personal history of pulmonary embolism: Secondary | ICD-10-CM | POA: Insufficient documentation

## 2023-01-20 DIAGNOSIS — C7951 Secondary malignant neoplasm of bone: Secondary | ICD-10-CM | POA: Diagnosis not present

## 2023-01-20 DIAGNOSIS — D649 Anemia, unspecified: Secondary | ICD-10-CM | POA: Diagnosis not present

## 2023-01-20 LAB — CMP (CANCER CENTER ONLY)
ALT: 30 U/L (ref 0–44)
AST: 30 U/L (ref 15–41)
Albumin: 3.2 g/dL — ABNORMAL LOW (ref 3.5–5.0)
Alkaline Phosphatase: 39 U/L (ref 38–126)
Anion gap: 12 (ref 5–15)
BUN: 23 mg/dL (ref 8–23)
CO2: 24 mmol/L (ref 22–32)
Calcium: 9.4 mg/dL (ref 8.9–10.3)
Chloride: 104 mmol/L (ref 98–111)
Creatinine: 1.07 mg/dL (ref 0.61–1.24)
GFR, Estimated: >60 mL/min (ref >60)
Glucose, Bld: 144 mg/dL — ABNORMAL HIGH (ref 70–99)
Potassium: 4.6 mmol/L (ref 3.5–5.1)
Sodium: 140 mmol/L (ref 135–145)
Total Bilirubin: 0.6 mg/dL (ref 0.3–1.2)
Total Protein: 6.1 g/dL — ABNORMAL LOW (ref 6.5–8.1)

## 2023-01-20 LAB — CBC AND DIFFERENTIAL
HCT: 45 (ref 41–53)
Hemoglobin: 14.9 (ref 13.5–17.5)
Neutrophils Absolute: 10.54
Platelets: 153 10*3/uL (ref 150–400)
WBC: 12.4

## 2023-01-20 LAB — CBC: RBC: 4.75 (ref 3.87–5.11)

## 2023-01-21 ENCOUNTER — Other Ambulatory Visit: Payer: Self-pay | Admitting: Oncology

## 2023-01-21 ENCOUNTER — Encounter: Payer: Self-pay | Admitting: Hematology and Oncology

## 2023-01-21 DIAGNOSIS — C3412 Malignant neoplasm of upper lobe, left bronchus or lung: Secondary | ICD-10-CM

## 2023-02-01 ENCOUNTER — Telehealth: Payer: Self-pay | Admitting: *Deleted

## 2023-02-01 NOTE — Telephone Encounter (Signed)
Patient was treated for his blood infection.  He has also been on Decadron 2 mg once daily for the past 30 days and is doing well per the wife.  Swelling had decreased , gait is improved, and speech is good.  Left sided weakness improved as well.  She wanted to know if they should continue Decadron 2 mg daily and if so they require a refill to [facility].    Next follow up is at end of April.  Routed to Dr Mickeal Skinner.

## 2023-02-01 NOTE — Telephone Encounter (Signed)
Communicated dose changes to wife.  She states understanding and has no further questions and will call if any issues arise.

## 2023-02-15 DIAGNOSIS — M7989 Other specified soft tissue disorders: Secondary | ICD-10-CM | POA: Diagnosis not present

## 2023-02-15 DIAGNOSIS — M79602 Pain in left arm: Secondary | ICD-10-CM | POA: Diagnosis not present

## 2023-02-15 DIAGNOSIS — J9811 Atelectasis: Secondary | ICD-10-CM | POA: Diagnosis not present

## 2023-02-16 DIAGNOSIS — I517 Cardiomegaly: Secondary | ICD-10-CM | POA: Diagnosis not present

## 2023-02-16 DIAGNOSIS — M160 Bilateral primary osteoarthritis of hip: Secondary | ICD-10-CM | POA: Diagnosis not present

## 2023-02-16 DIAGNOSIS — Z043 Encounter for examination and observation following other accident: Secondary | ICD-10-CM | POA: Diagnosis not present

## 2023-02-16 DIAGNOSIS — R600 Localized edema: Secondary | ICD-10-CM | POA: Diagnosis not present

## 2023-02-19 ENCOUNTER — Other Ambulatory Visit: Payer: No Typology Code available for payment source

## 2023-02-19 ENCOUNTER — Ambulatory Visit: Payer: No Typology Code available for payment source | Admitting: Oncology

## 2023-02-23 ENCOUNTER — Telehealth: Payer: Self-pay

## 2023-02-23 NOTE — Telephone Encounter (Unsigned)
Pt's wife has called and wants to know what Dr Bobby Rumpf thinks about pt going home with Hospice. Is it appropriate? Pt being discharged home tomorrow. She states, "He is still weak. He can get up and down, but can't go very far".  Dr Bobby Rumpf: Is he potentially hospice appropriate? Yes. She just needs to understand that if hospice is brought in, no further therapy would be given to him for his lung cancer.

## 2023-02-24 ENCOUNTER — Encounter: Payer: Self-pay | Admitting: Hematology and Oncology

## 2023-03-03 ENCOUNTER — Other Ambulatory Visit: Payer: Self-pay | Admitting: Oncology

## 2023-03-22 ENCOUNTER — Telehealth: Payer: Self-pay | Admitting: *Deleted

## 2023-03-22 NOTE — Telephone Encounter (Signed)
Wife called to advise that patient is now being followed by [facility]

## 2023-03-27 ENCOUNTER — Other Ambulatory Visit: Payer: Self-pay | Admitting: Internal Medicine

## 2023-03-27 DIAGNOSIS — C3412 Malignant neoplasm of upper lobe, left bronchus or lung: Secondary | ICD-10-CM

## 2023-03-29 ENCOUNTER — Encounter: Payer: Self-pay | Admitting: Hematology and Oncology

## 2023-03-31 ENCOUNTER — Encounter: Payer: Self-pay | Admitting: Hematology and Oncology

## 2023-03-31 NOTE — Telephone Encounter (Signed)
close

## 2023-04-01 ENCOUNTER — Inpatient Hospital Stay: Admission: RE | Admit: 2023-04-01 | Payer: No Typology Code available for payment source | Source: Ambulatory Visit

## 2023-04-05 ENCOUNTER — Inpatient Hospital Stay: Payer: No Typology Code available for payment source

## 2023-04-06 ENCOUNTER — Inpatient Hospital Stay: Payer: No Typology Code available for payment source | Admitting: Internal Medicine

## 2023-04-07 DEATH — deceased
